# Patient Record
Sex: Female | Born: 1940 | Race: Black or African American | Hispanic: No | Smoking: Never smoker
Health system: Southern US, Community
[De-identification: ages and names within clinical notes are randomized; demographics above are authoritative.]

## PROBLEM LIST (undated history)

## (undated) DIAGNOSIS — I7121 Aneurysm of the ascending aorta, without rupture: Secondary | ICD-10-CM

## (undated) DIAGNOSIS — D132 Benign neoplasm of duodenum: Secondary | ICD-10-CM

## (undated) DIAGNOSIS — I10 Essential (primary) hypertension: Secondary | ICD-10-CM

## (undated) DIAGNOSIS — M1990 Unspecified osteoarthritis, unspecified site: Secondary | ICD-10-CM

## (undated) DIAGNOSIS — D649 Anemia, unspecified: Secondary | ICD-10-CM

## (undated) DIAGNOSIS — K5790 Diverticulosis of intestine, part unspecified, without perforation or abscess without bleeding: Secondary | ICD-10-CM

## (undated) DIAGNOSIS — B373 Candidiasis of vulva and vagina: Secondary | ICD-10-CM

## (undated) DIAGNOSIS — N39490 Overflow incontinence: Secondary | ICD-10-CM

## (undated) DIAGNOSIS — M5430 Sciatica, unspecified side: Secondary | ICD-10-CM

## (undated) DIAGNOSIS — B3731 Acute candidiasis of vulva and vagina: Secondary | ICD-10-CM

## (undated) DIAGNOSIS — N393 Stress incontinence (female) (male): Secondary | ICD-10-CM

## (undated) DIAGNOSIS — D369 Benign neoplasm, unspecified site: Secondary | ICD-10-CM

## (undated) DIAGNOSIS — N3090 Cystitis, unspecified without hematuria: Secondary | ICD-10-CM

## (undated) DIAGNOSIS — K2970 Gastritis, unspecified, without bleeding: Secondary | ICD-10-CM

## (undated) DIAGNOSIS — E785 Hyperlipidemia, unspecified: Secondary | ICD-10-CM

## (undated) DIAGNOSIS — C539 Malignant neoplasm of cervix uteri, unspecified: Secondary | ICD-10-CM

## (undated) HISTORY — PX: ABDOMINAL HYSTERECTOMY: SHX81

## (undated) HISTORY — PX: ERCP: SHX60

---

## 2008-02-24 ENCOUNTER — Ambulatory Visit: Payer: Self-pay | Admitting: Family Medicine

## 2008-08-03 ENCOUNTER — Ambulatory Visit: Payer: Self-pay | Admitting: Family Medicine

## 2008-08-03 IMAGING — US ABDOMEN ULTRASOUND
1 series · 17 of 25 positions shown · non-contrast
Comparison: none

REASON FOR EXAM: RUQ abd pain
COMMENTS:

[Series 1: abdomen ultrasound · 17 of 59 slices shown]
[im 1/59]
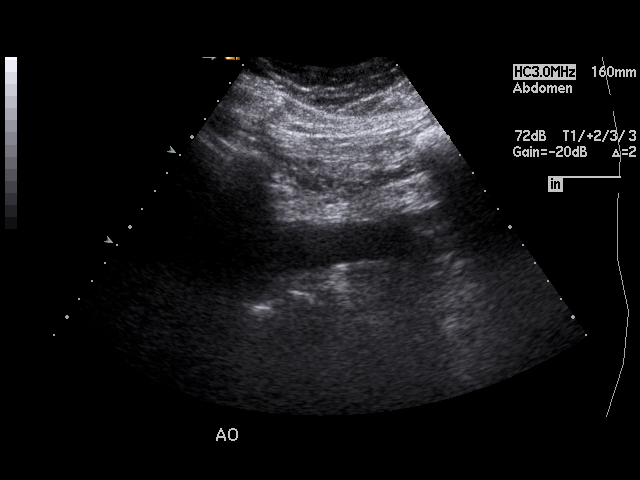
[im 5/59]
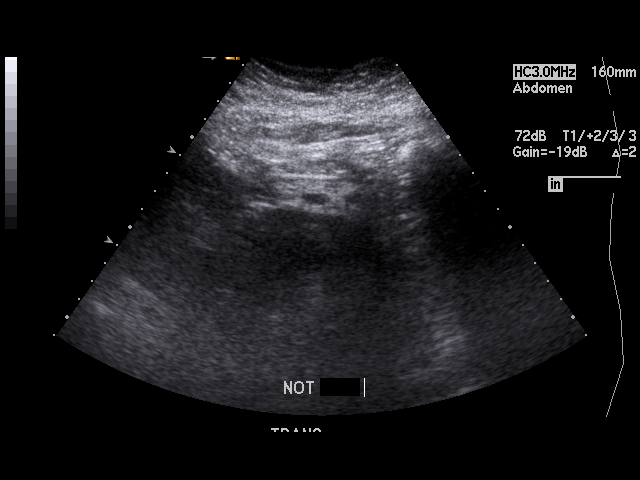
[im 8/59]
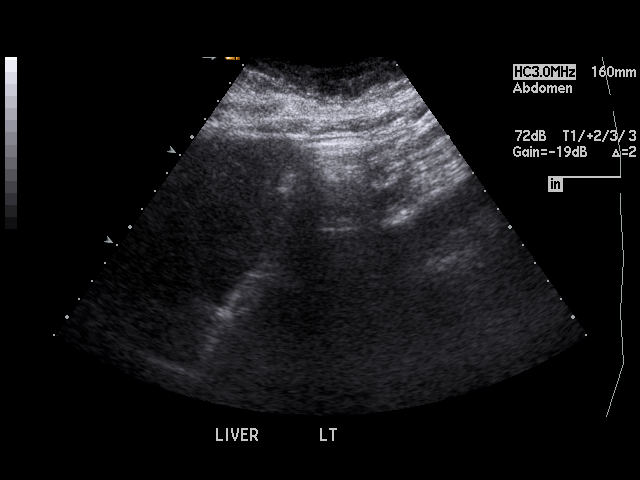
[im 13/59]
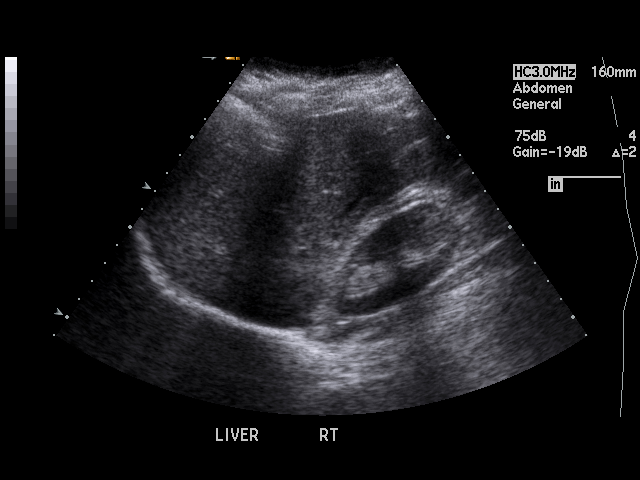
[im 15/59]
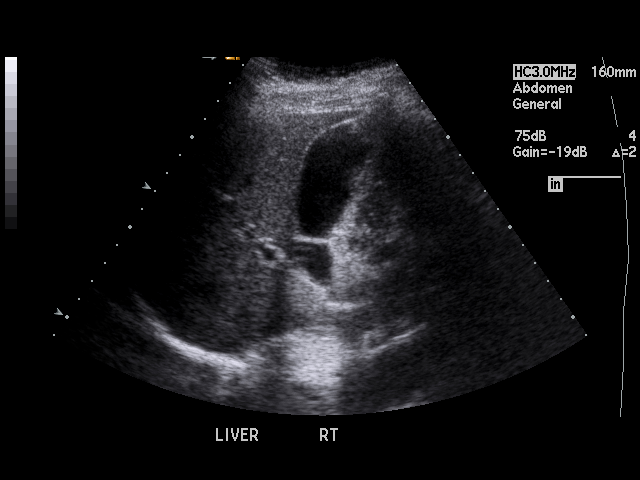
[im 20/59]
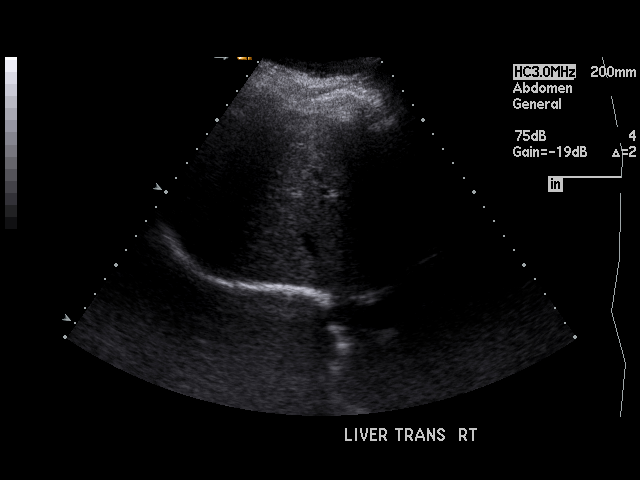
[im 22/59]
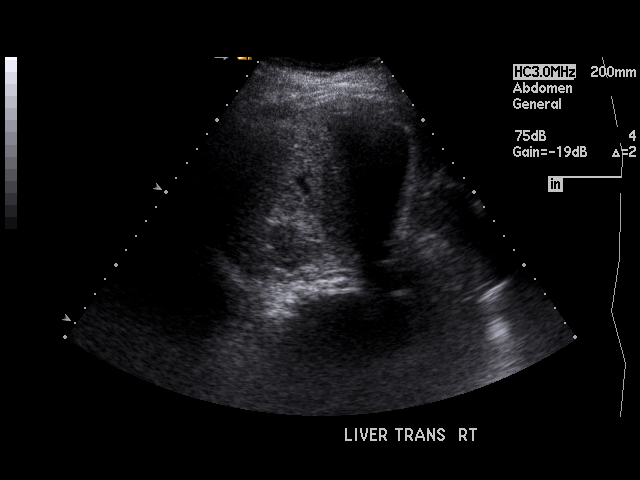
[im 27/59]
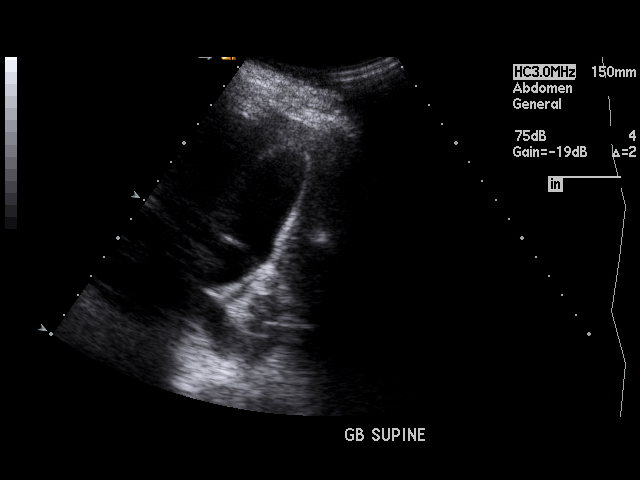
[im 30/59]
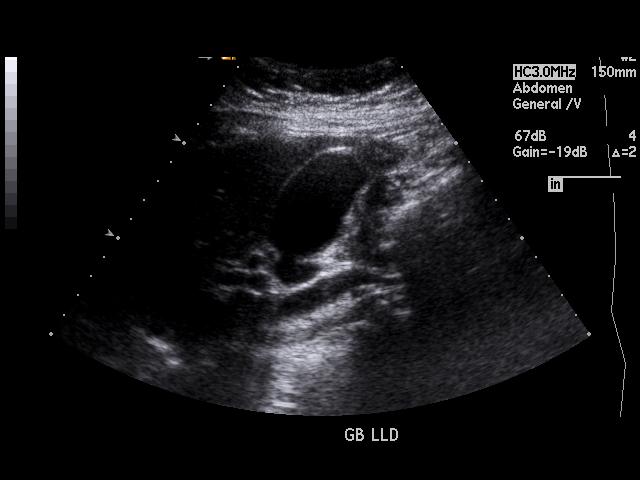
[im 32/59]
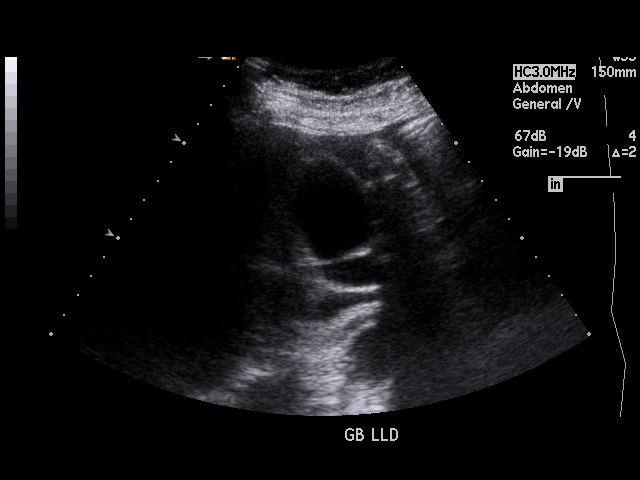
[im 37/59]
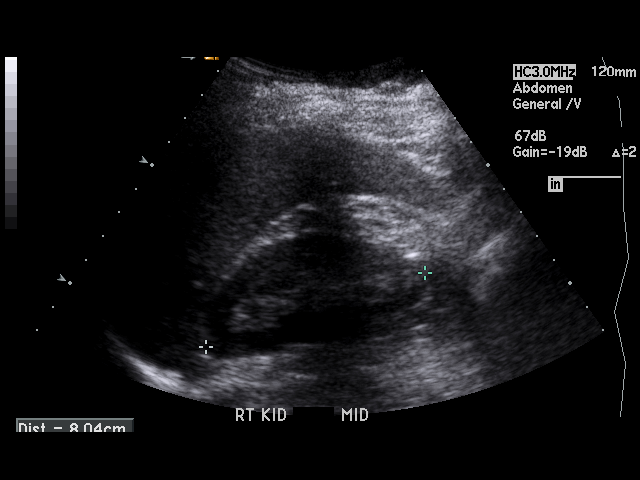
[im 39/59]
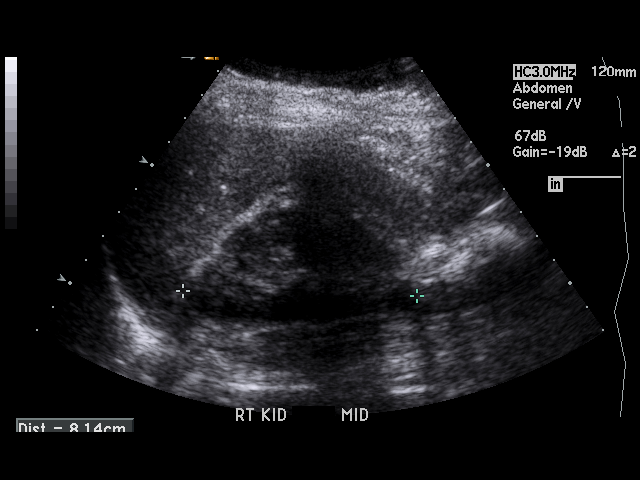
[im 44/59]
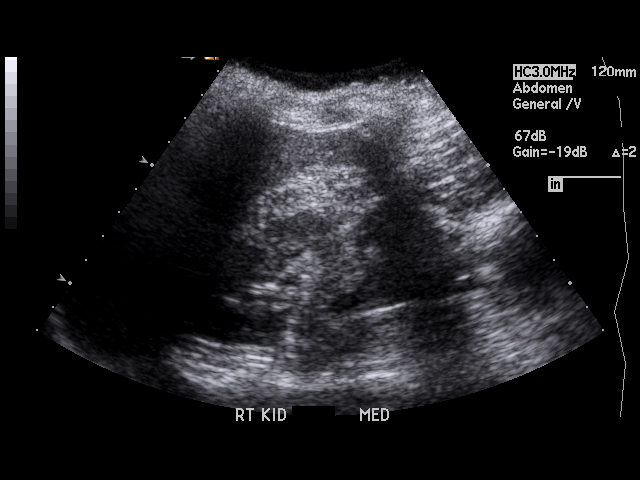
[im 46/59]
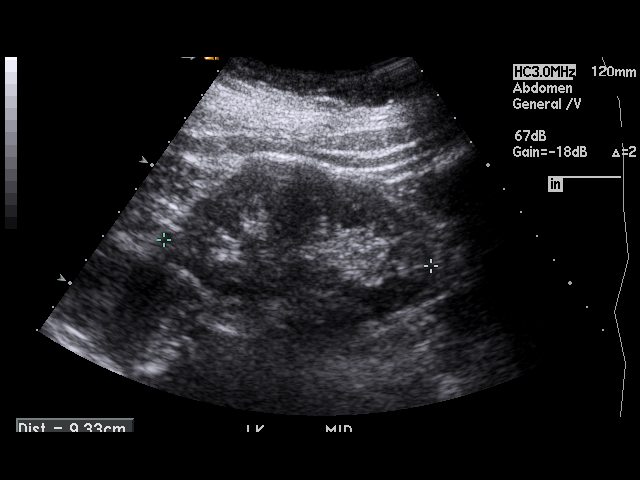
[im 51/59]
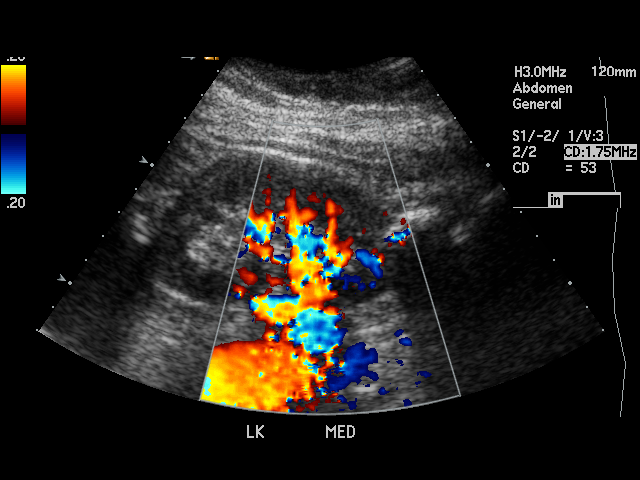
[im 54/59]
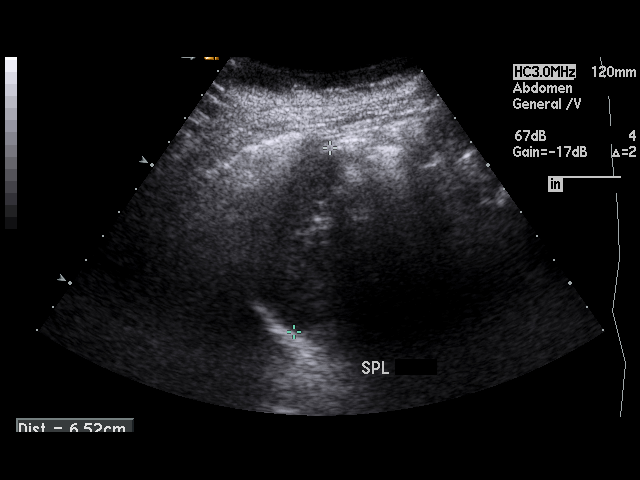
[im 59/59]
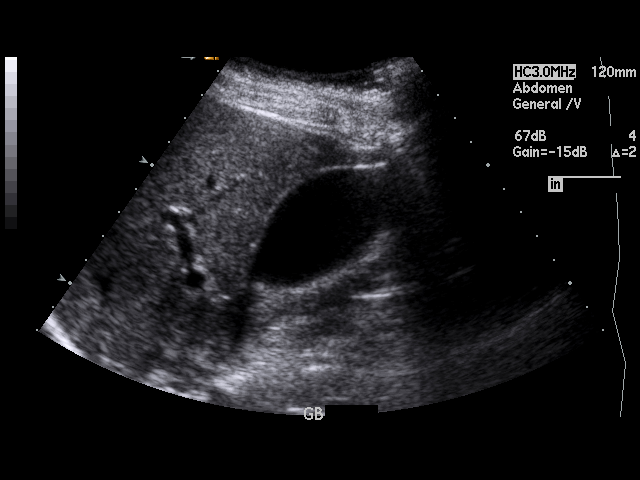

[17 of 25 positions shown; findings below may reference images not displayed]

PROCEDURE:     US  - US ABDOMEN GENERAL SURVEY  - [DATE]  [DATE]

RESULT:     Ultrasound of the abdominal aorta shows no gross abnormality.
Portions are obscured. The pancreas cannot be seen because of overlying
bowel gas. Visualization of the liver is also marginal at best. There is
increased echotexture of the liver consistent with fatty infiltration. There
is no ductal dilation. The common bile duct diameter is 6.5 mm. The portal
venous flow is normal. The right kidney length is only 8 cm. There is no
obstruction or mass. There are no gallstones evident. Gallbladder wall
thickness is 2.2 mm. The spleen and left kidney appear to be unremarkable.
IMPRESSION: 1.     Limited visualization of the liver with poor visualization or
non-visualization of the pancreas.
2.     The right kidney is small measuring just over 8 cm in length. The
left kidney length is 9.5 cm.

## 2008-09-08 ENCOUNTER — Ambulatory Visit: Payer: Self-pay | Admitting: Gastroenterology

## 2008-09-30 ENCOUNTER — Ambulatory Visit: Payer: Self-pay | Admitting: Gastroenterology

## 2008-09-30 IMAGING — CT CT ABD-PELV W/ CM
1 of 2 series · 14 of 32 positions shown, 18 images · non-contrast
Comparison: none

REASON FOR EXAM: COMMENTS:

[Series 2: abdomen · axial · 0.75mm/px · z∈[-556,-116]mm · 14 of 98 slices shown, 18 images]
[im 5/98  soft-tissue]
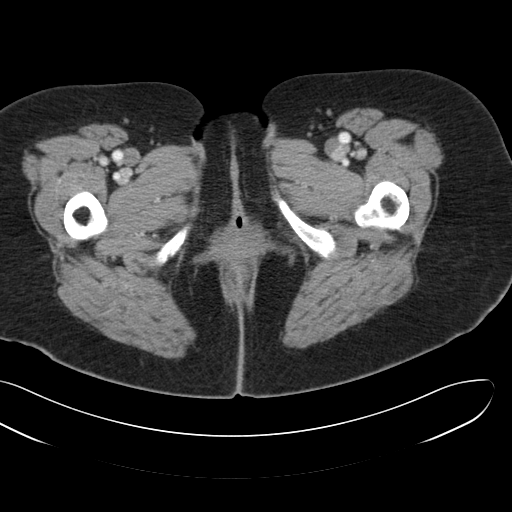
[im 5/98  bone]
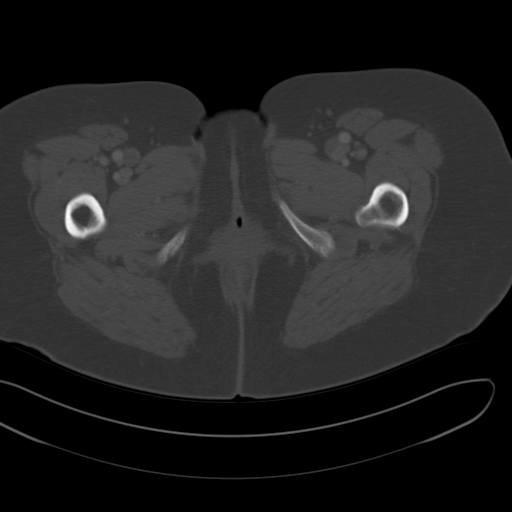
[im 13/98  soft-tissue]
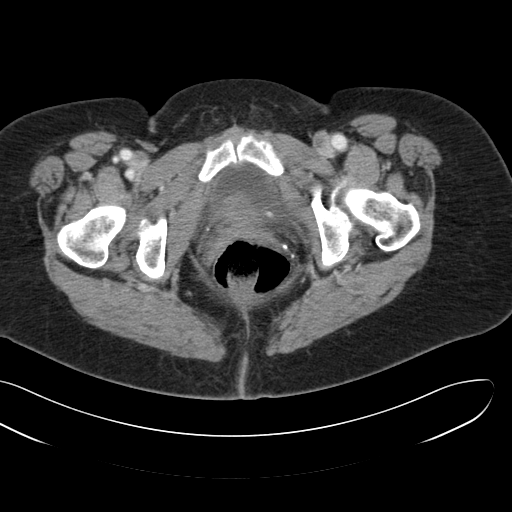
[im 21/98  soft-tissue]
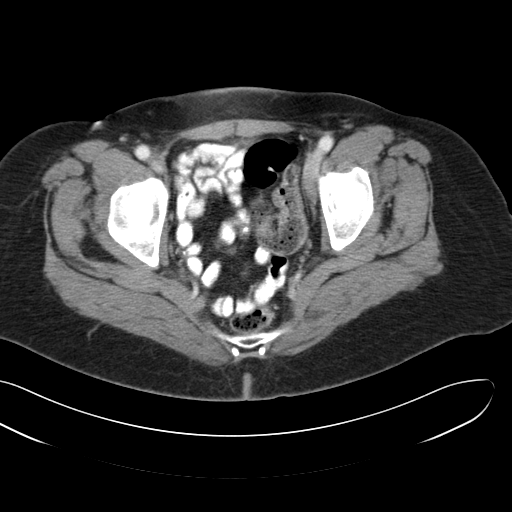
[im 29/98  soft-tissue]
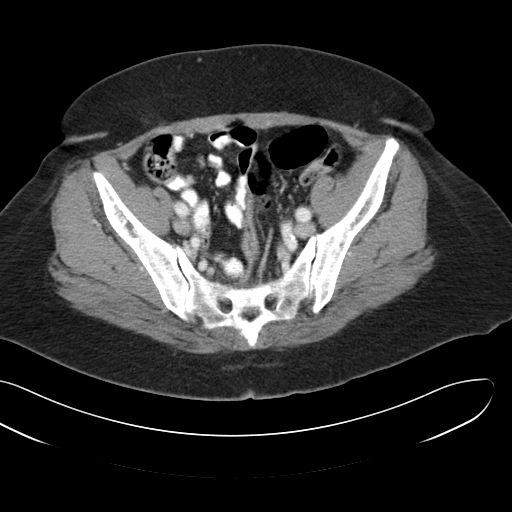
[im 37/98  soft-tissue]
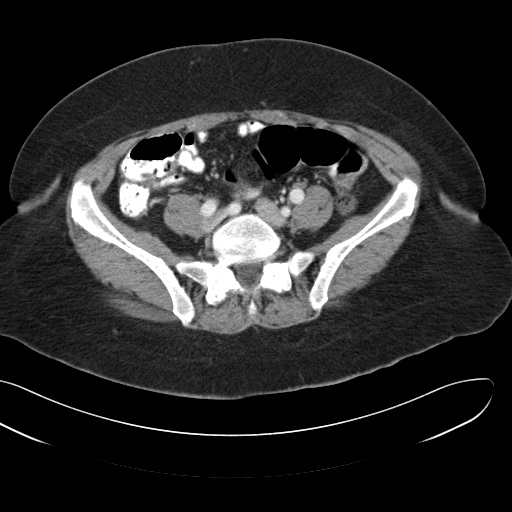
[im 45/98  soft-tissue]
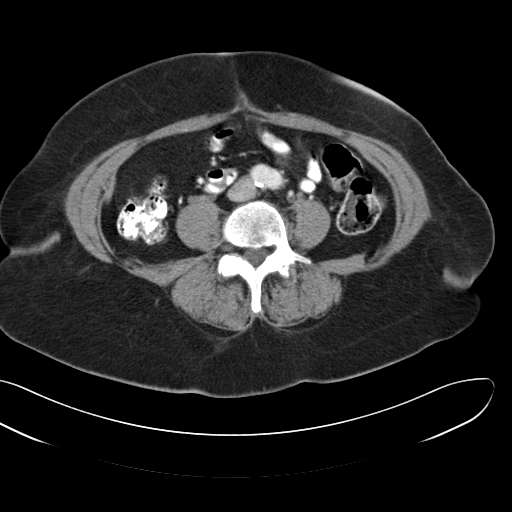
[im 53/98  soft-tissue]
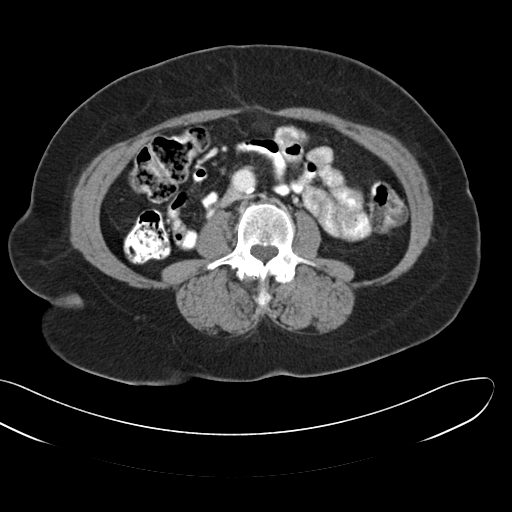
[im 61/98  soft-tissue]
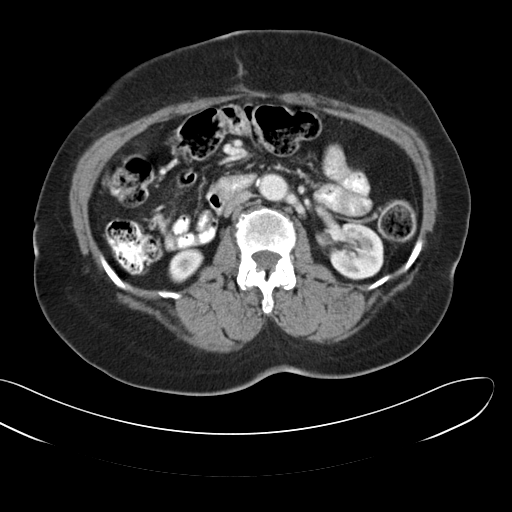
[im 69/98  soft-tissue]
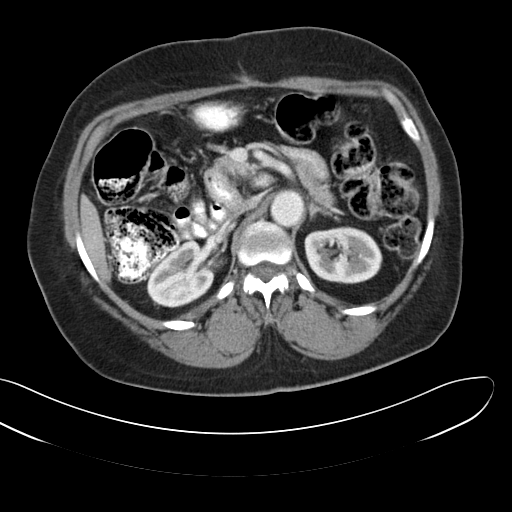
[im 69/98  bone]
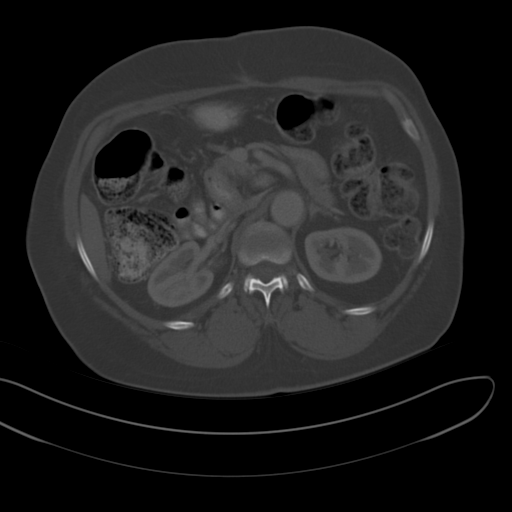
[im 77/98  soft-tissue]
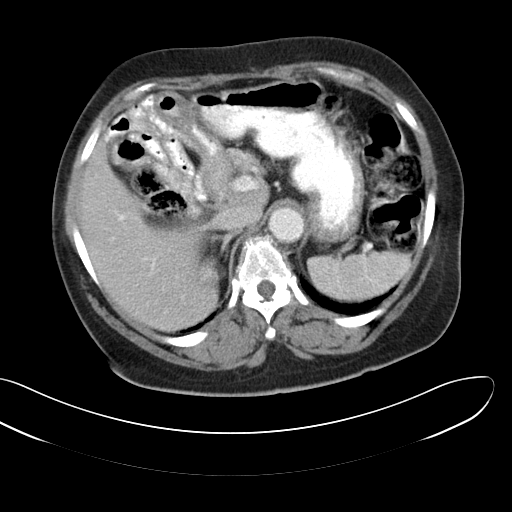
[im 81/98  lung]
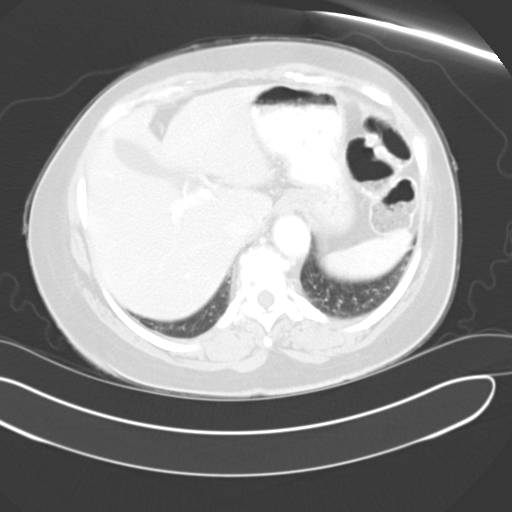
[im 85/98  soft-tissue]
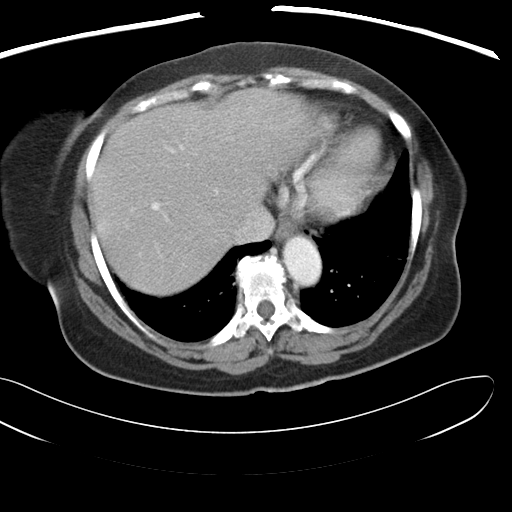
[im 85/98  lung]
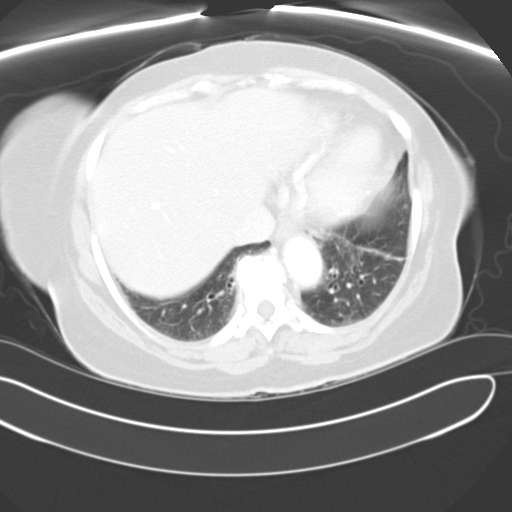
[im 89/98  lung]
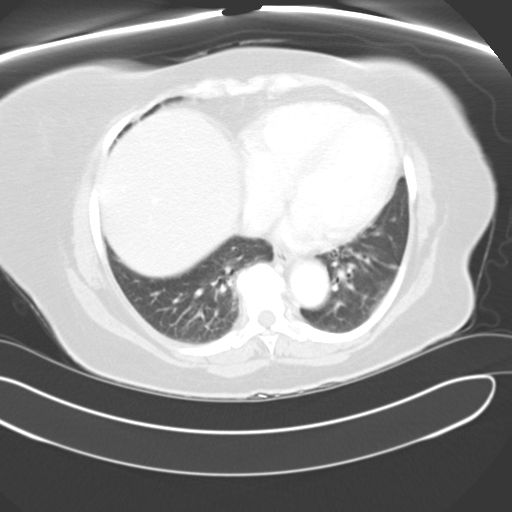
[im 93/98  soft-tissue]
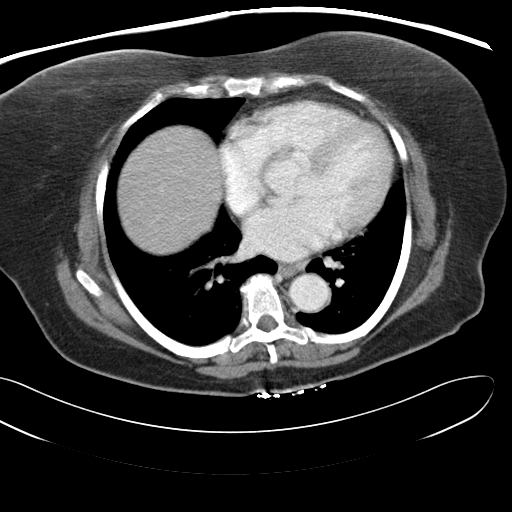
[im 93/98  lung]
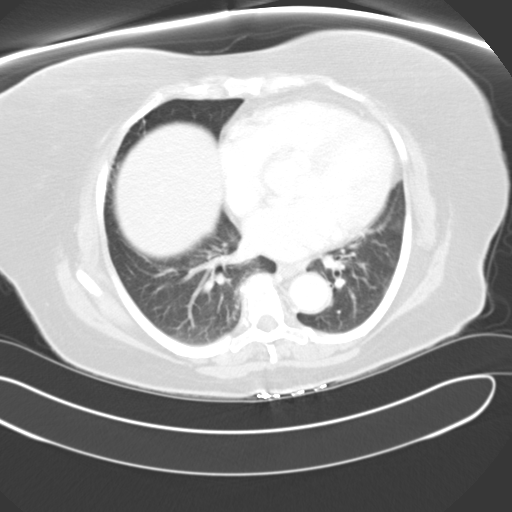

[14 of 32 positions shown; findings below may reference images not displayed]

PROCEDURE:     CT  - CT ABDOMEN / PELVIS  W  - [DATE]  [DATE]

RESULT:      Axial contrast enhanced CT scanning was performed through the
abdomen and pelvis at 5 mm intervals and slice thicknesses. The patient
received 100 cc of [2A] as well as oral contrast material. Review of
3-dimensional reconstructed images was performed separately on the [employer]
Server monitor.

The partially contrast-filled loops of small bowel exhibit no acute
abnormality. The stool and contrast in gas pattern within the colon is
normal. The site of the previous cancerous polyp is not known to me. No
annular constricting lesion is demonstrated. I do not see evidence of
mesenteric or retroperitoneal lymphadenopathy.

The liver, gallbladder, spleen, stomach, and pancreas exhibit no acute
abnormality. The adrenal glands are normal in density and contour. The
kidneys enhance well and exhibit no evidence of obstruction. The abdominal
aorta is tortuous in its course but there is no evidence of an aneurysm. The
lung bases exhibit minimal increased density in the posterior aspects of
both lower lobes consistent with subsegmental atelectasis and/or fibrotic
change. Minimal bronchiectasis is suspected here as well. The lumbar
vertebral bodies are preserved in height. The partially distended urinary
bladder is normal in appearance. The uterus is surgically absent. I see no
adnexal masses. There is no evidence of an inguinal hernia.
IMPRESSION: 1. I do not see acute abnormality of the partially contrast-filled colon.
The small bowel exhibits no acute abnormality either.
2. There are no findings suspicious for metastatic disease to the liver or
other abdominal viscera.
3. There is no retroperitoneal nor intraperitoneal lymphadenopathy.

Addendum: The history of prior duodenal ulcer has been provided to me. On
today's study the stomach is partially distended and is grossly normal.
There is mild prominence of the wall of the proximal duodenum which could be
due to incomplete distention. However, given the history of prior abnormal
endoscopy and known duodenal ulcer, inflammatory mucosal edema cannot be
excluded. No discrete ulcer niche is demonstrated.

## 2009-02-27 ENCOUNTER — Ambulatory Visit: Payer: Self-pay | Admitting: Family Medicine

## 2010-02-20 ENCOUNTER — Ambulatory Visit: Payer: Self-pay | Admitting: Gastroenterology

## 2010-02-20 HISTORY — PX: COLONOSCOPY: SHX174

## 2010-02-28 ENCOUNTER — Ambulatory Visit: Payer: Self-pay | Admitting: Family Medicine

## 2010-03-05 ENCOUNTER — Ambulatory Visit: Payer: Self-pay | Admitting: Gastroenterology

## 2010-03-05 IMAGING — CR DG BE W/ AIR HIGH DENSITY
2 series · 10 of 10 positions shown · non-contrast
Comparison: none

REASON FOR EXAM: incomplete colonoscopy hx colon polyps
COMMENTS:

[Series 1: run · 5 of 13 slices shown]
[im 1/13]
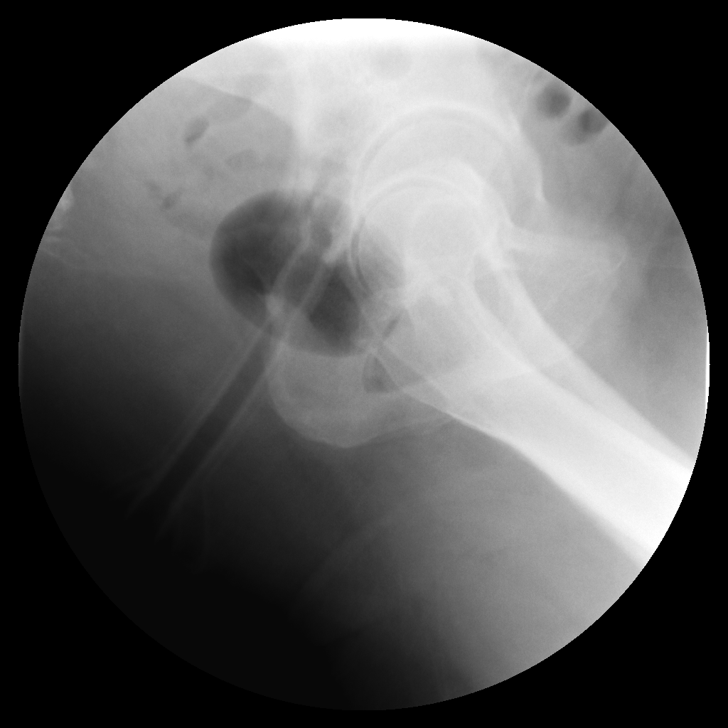
[im 4/13]
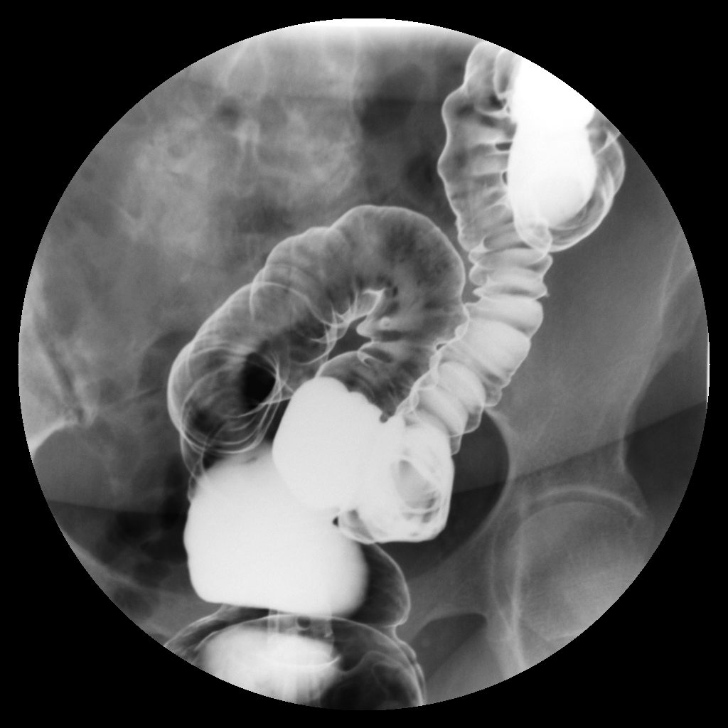
[im 7/13]
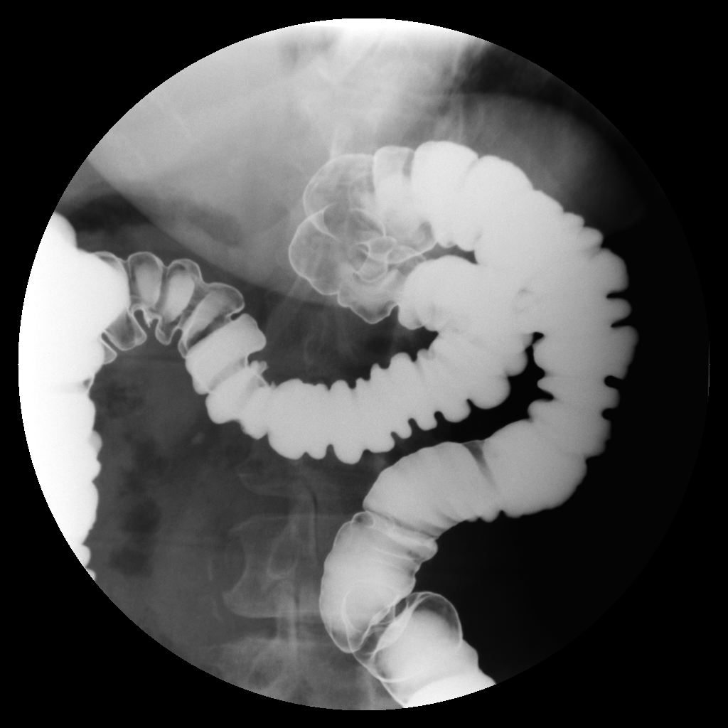
[im 10/13]
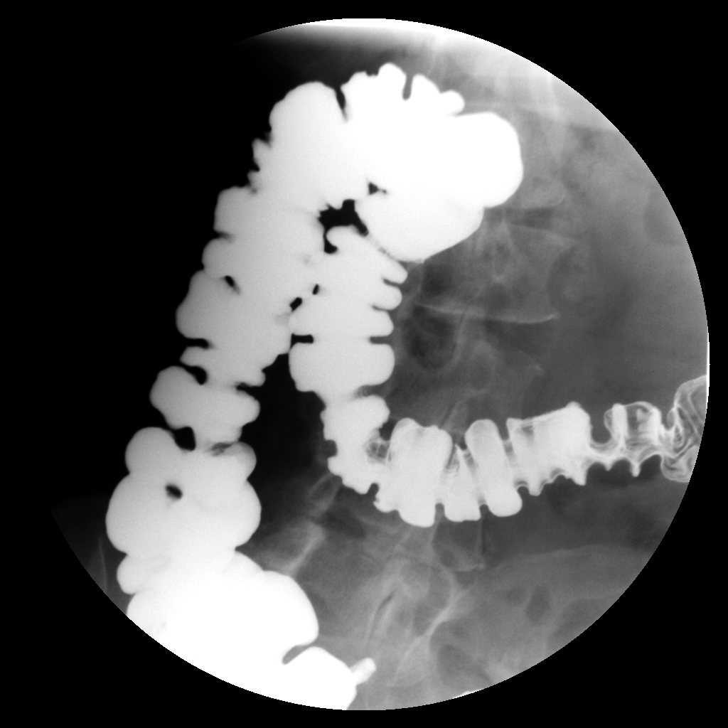
[im 13/13]
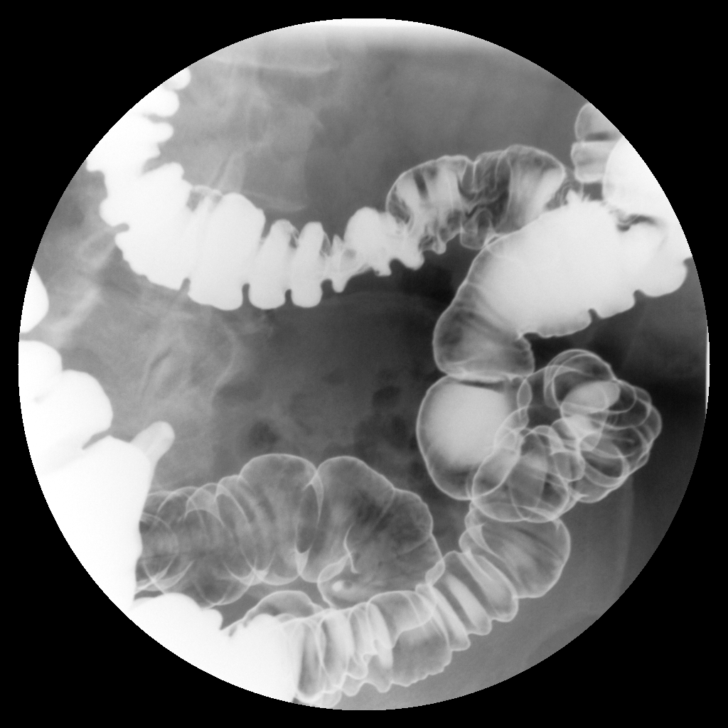

[Series 1: view not recorded · 0.17mm/px · 5 of 15 slices shown]
[im 1/15]
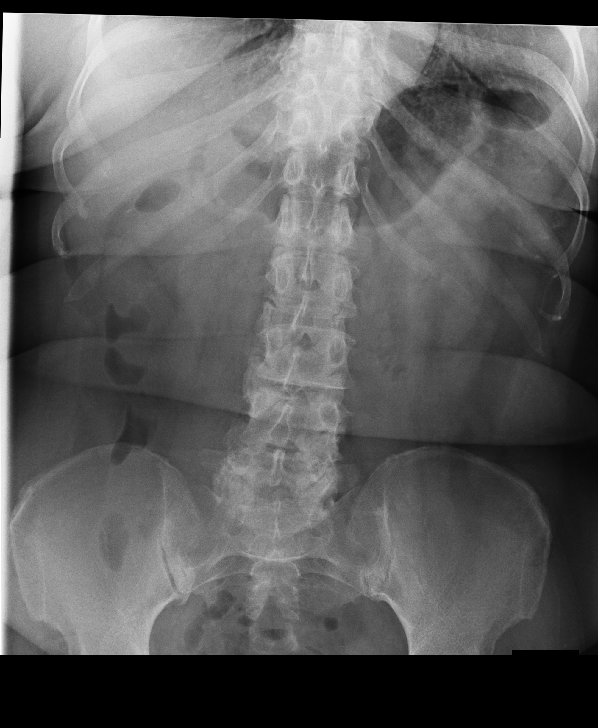
[im 4/15]
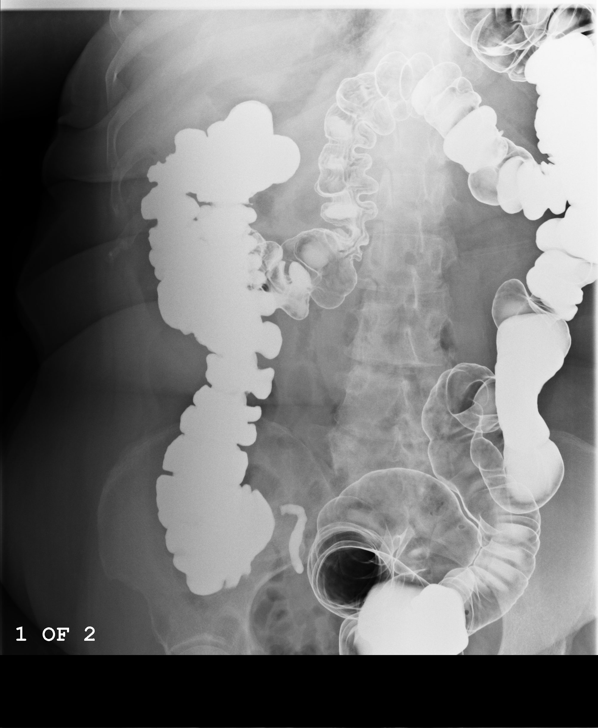
[im 8/15]
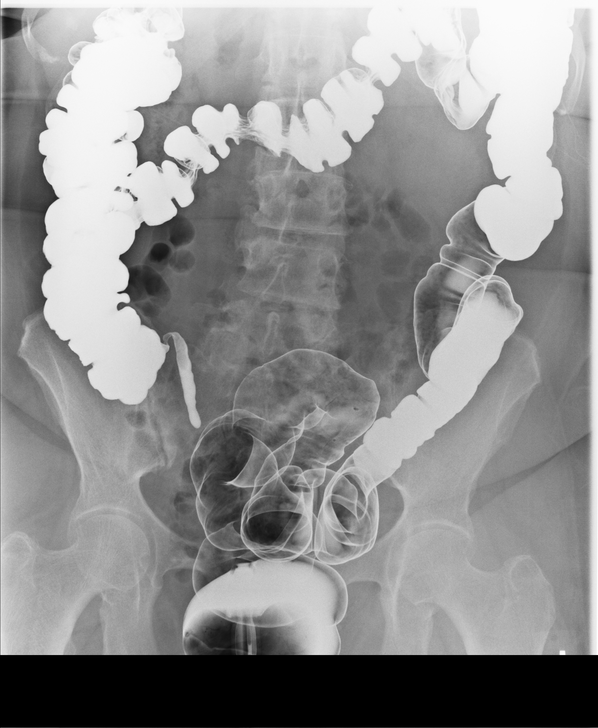
[im 11/15]
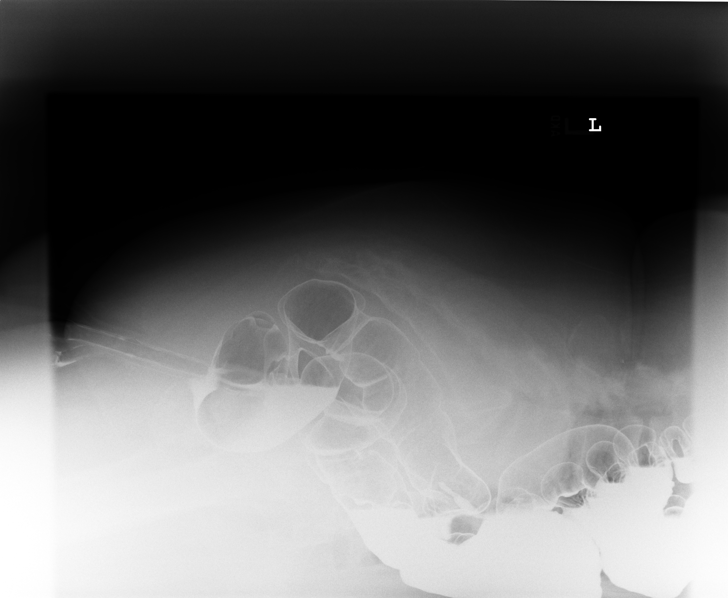
[im 15/15]
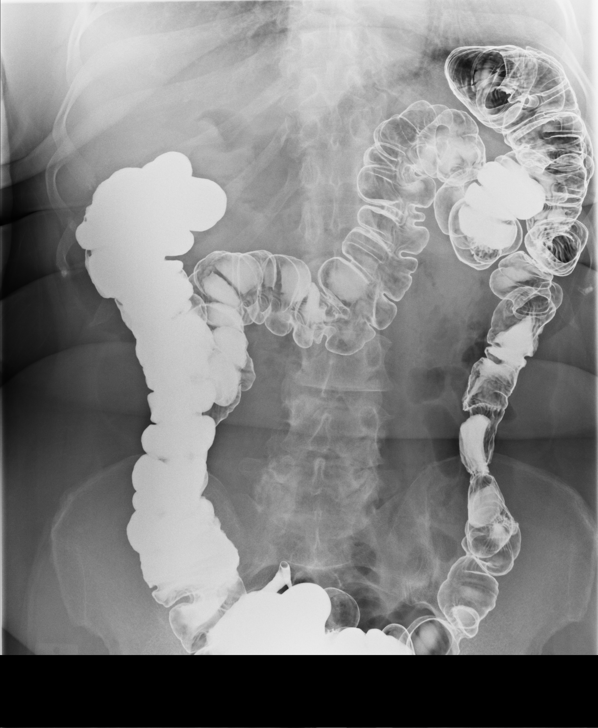

[10 of 10 positions shown; findings below may reference images not displayed]

PROCEDURE:     FL  - FL BARIUM ENEMA WITH AIR COLON  - [DATE] [DATE]

RESULT:     The anticipated procedure was discussed with the patient. She
voiced her willingness to proceed.

The scout film revealed a normal stool and gas pattern. The barium catheter
balloon was inflated under fluoroscopic guidance. Subsequently barium passed
freely into the somewhat redundant colon. It passed to the right colon.
Draining was then accomplished and air was insufflated. Adequate coating and
distention of the colon was obtained. The appendix did fill. No annular
constricting lesions were demonstrated. There is no significant
diverticulosis. No polypoid lesions were identified but due to the
redundancy of the colon it was difficult to see all of the mucosa in relief.
There are no findings suspicious for colitis.
IMPRESSION: 1. The colon is quite redundant.
2. I do not see objective evidence of annular constricting lesions,
extrinsic impression upon the bowel, nor evidence of colitis or
diverticulitis.
3. There are no findings suspicious for obstruction of the colon.

## 2010-04-30 ENCOUNTER — Ambulatory Visit: Payer: Self-pay | Admitting: Gastroenterology

## 2010-05-01 LAB — PATHOLOGY REPORT

## 2010-06-04 ENCOUNTER — Telehealth: Payer: Self-pay

## 2010-06-04 DIAGNOSIS — D132 Benign neoplasm of duodenum: Secondary | ICD-10-CM

## 2010-06-04 NOTE — Telephone Encounter (Signed)
Pt scheduled for EGD with Nick[facility] [place] for 06/07/10 [place] pt aware to arrive before the appt for preadmit testing and call the day before before 1-3 pm for arrival time. Meds reviewed and pt instructed,

## 2010-06-07 ENCOUNTER — Ambulatory Visit: Payer: Self-pay

## 2010-06-07 ENCOUNTER — Encounter: Payer: Self-pay | Admitting: Gastroenterology

## 2010-06-14 ENCOUNTER — Encounter: Payer: Self-pay | Admitting: Gastroenterology

## 2011-01-08 ENCOUNTER — Ambulatory Visit: Payer: Self-pay | Admitting: Gastroenterology

## 2011-01-09 LAB — PATHOLOGY REPORT

## 2011-03-04 ENCOUNTER — Ambulatory Visit: Payer: Self-pay | Admitting: Family Medicine

## 2011-08-09 ENCOUNTER — Ambulatory Visit: Payer: Self-pay | Admitting: Gastroenterology

## 2011-08-12 LAB — PATHOLOGY REPORT

## 2011-09-24 ENCOUNTER — Ambulatory Visit: Payer: Self-pay | Admitting: Vascular Surgery

## 2011-09-24 IMAGING — CT CT ANGIO NECK-CHEST
1 series · 12 of 14 positions shown · IV contrast (APPLIED)
Comparison: none

REASON FOR EXAM: Labs 1st  difficulty swallowing left subclavian artery
aneurysm  innominate ...
COMMENTS:

PROCEDURE:     KCT - KCT ANGIOGRAPHY CHEST/NECK W/WO  - [DATE] [DATE]
RESULT:     History: Aneurysm.
Comparison Study: No recent.

[Series 5: 3 soft tissue · axial · 0.36mm/px · z∈[-225,+12]mm · 12 of 95 slices shown]
[im 8/95  soft-tissue]
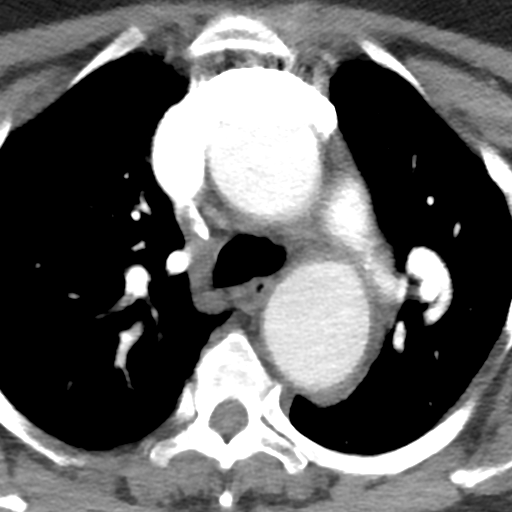
[im 15/95  bone]
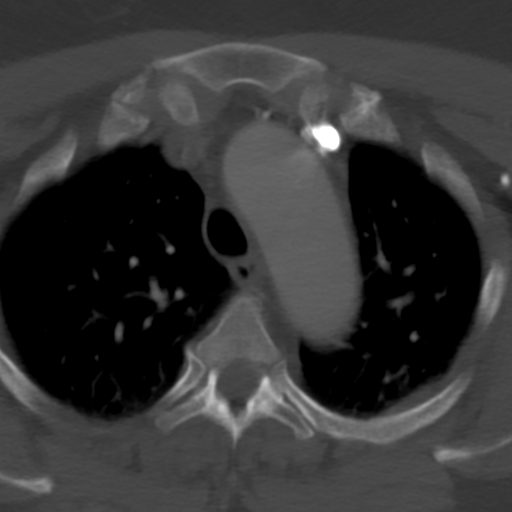
[im 22/95  soft-tissue]
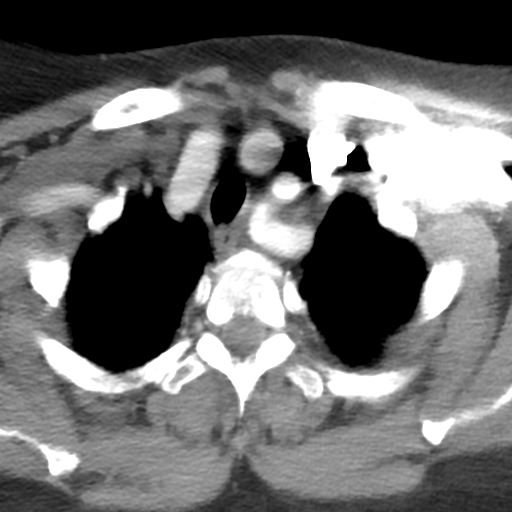
[im 29/95  bone]
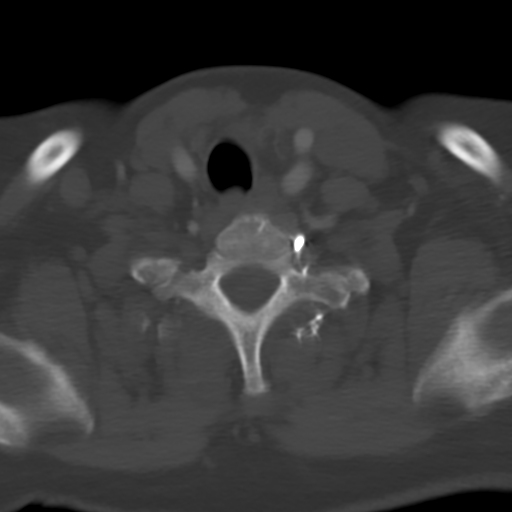
[im 37/95  soft-tissue]
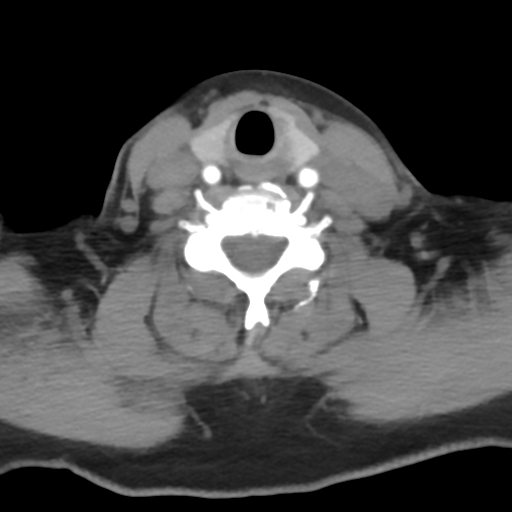
[im 44/95  bone]
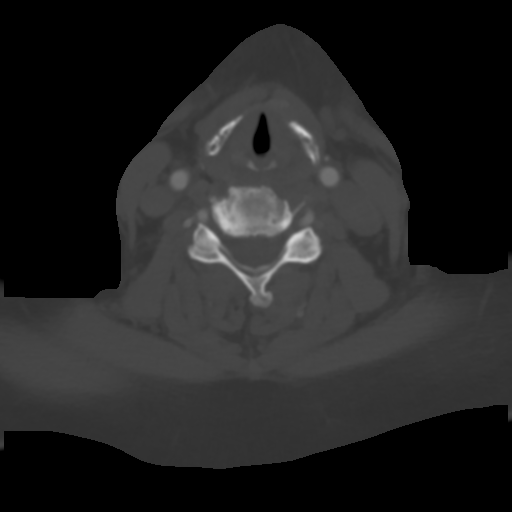
[im 51/95  soft-tissue]
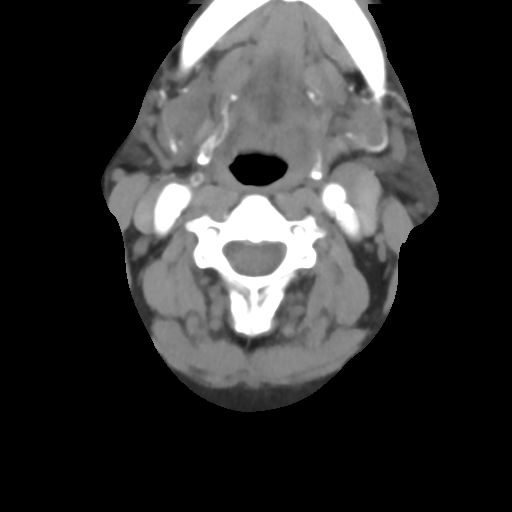
[im 58/95  bone]
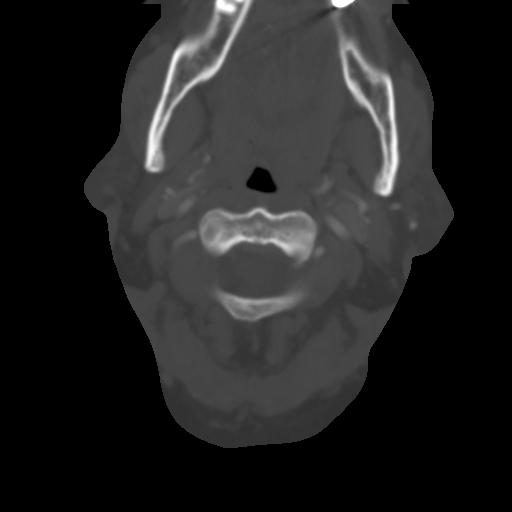
[im 66/95  soft-tissue]
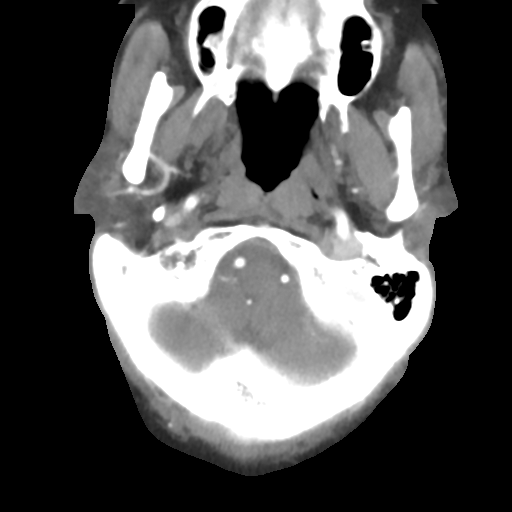
[im 73/95  bone]
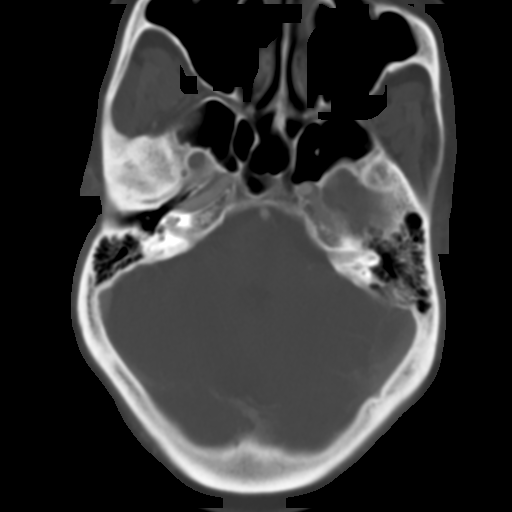
[im 80/95  soft-tissue]
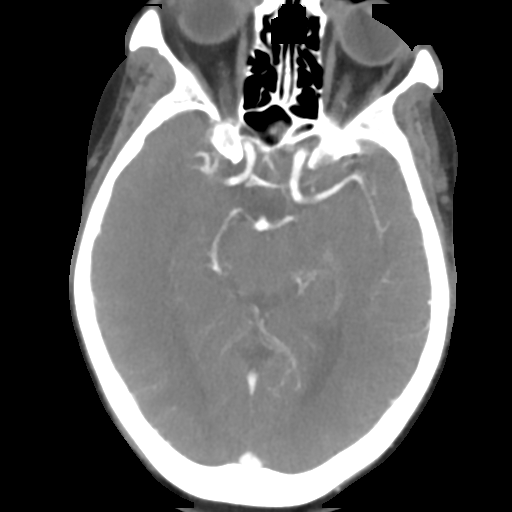
[im 87/95  bone]
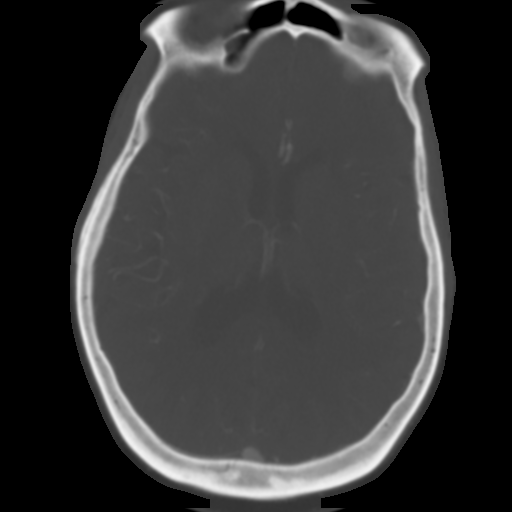

[12 of 14 positions shown; findings below may reference images not displayed]

FINDINGS: Standard CTA obtained under cc of [TL] there is a 1.7 cm in
on artery aneurysm present in the distal innominate artery. Innominate is
tortuous. Subclavian origin involvement is present with aneurysmal dose
patient extending just distal to the vertebral appear. Common carotid is
patent with no aneurysmal dilatation. Left common carotid subclavian widely
patent. Mild ectasia noted in the thoracic arch. Vertebrals are patent.
Calcific mild stenoses noted in both carotid bifurcations. Anterior and
posterior circulation is widely patent.
IMPRESSION: Innominate and right subclavian artery aneurysm as
described above.

## 2012-03-04 ENCOUNTER — Ambulatory Visit: Payer: Self-pay | Admitting: Family Medicine

## 2012-03-04 IMAGING — MG MM CAD SCREENING MAMMO
1 series · 4 of 4 positions shown · non-contrast
Comparison: none

REASON FOR EXAM: SCR MAMMO NO ORDER
COMMENTS:

PROCEDURE:     MAM - MAM DGTL SCRN MAM NO ORDER W/CAD  - [DATE] [DATE]
RESULT:     Bilateral digital screening mammogram dated [DATE]
Comparison study/studies dated: [DATE], [DATE], [DATE], [DATE]

[R CC · right · 4 of 4 slices shown]
[im 1/4]
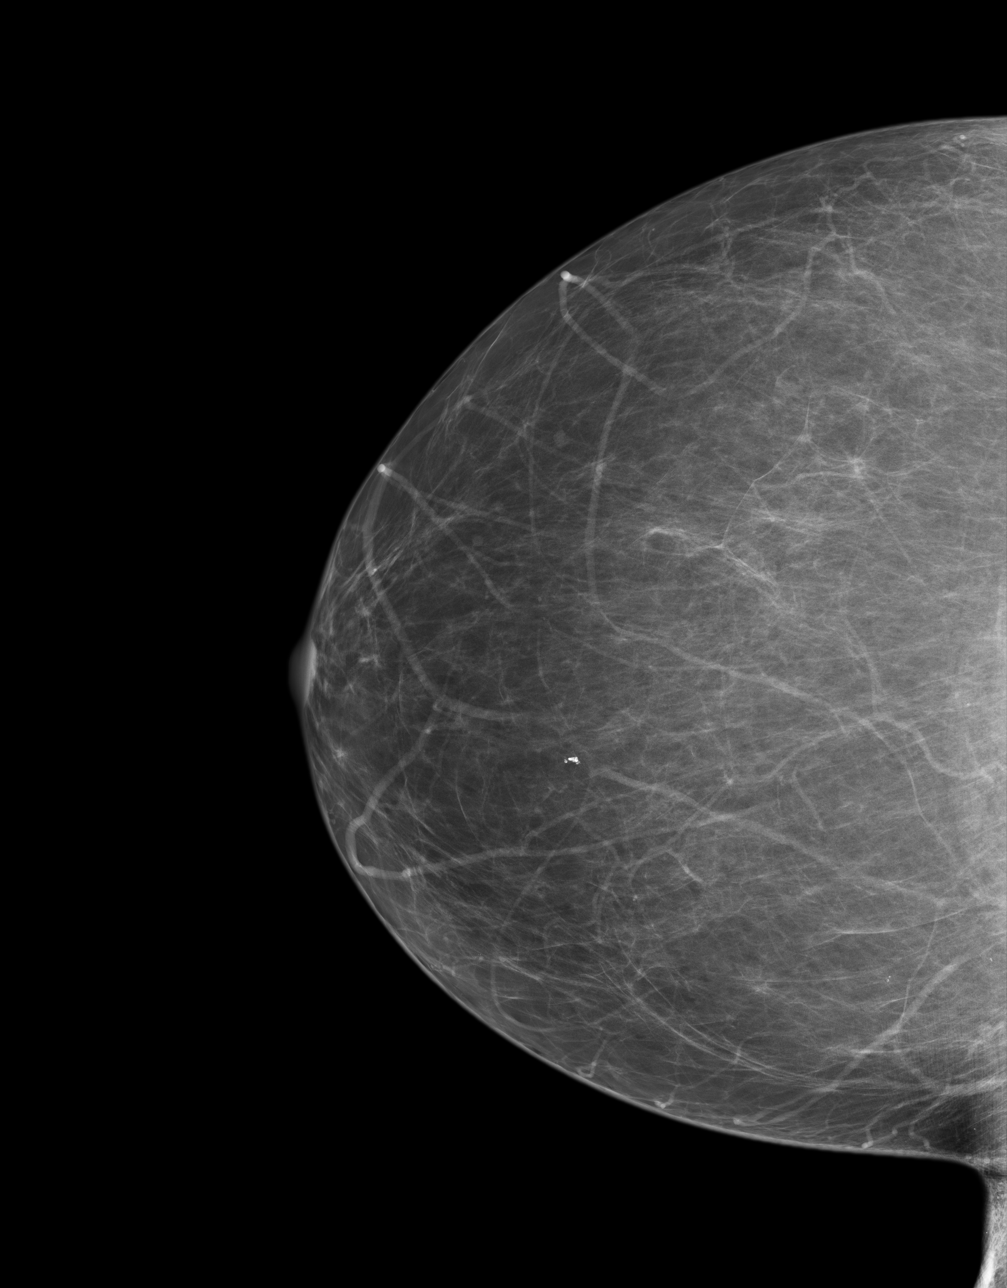
[im 2/4]
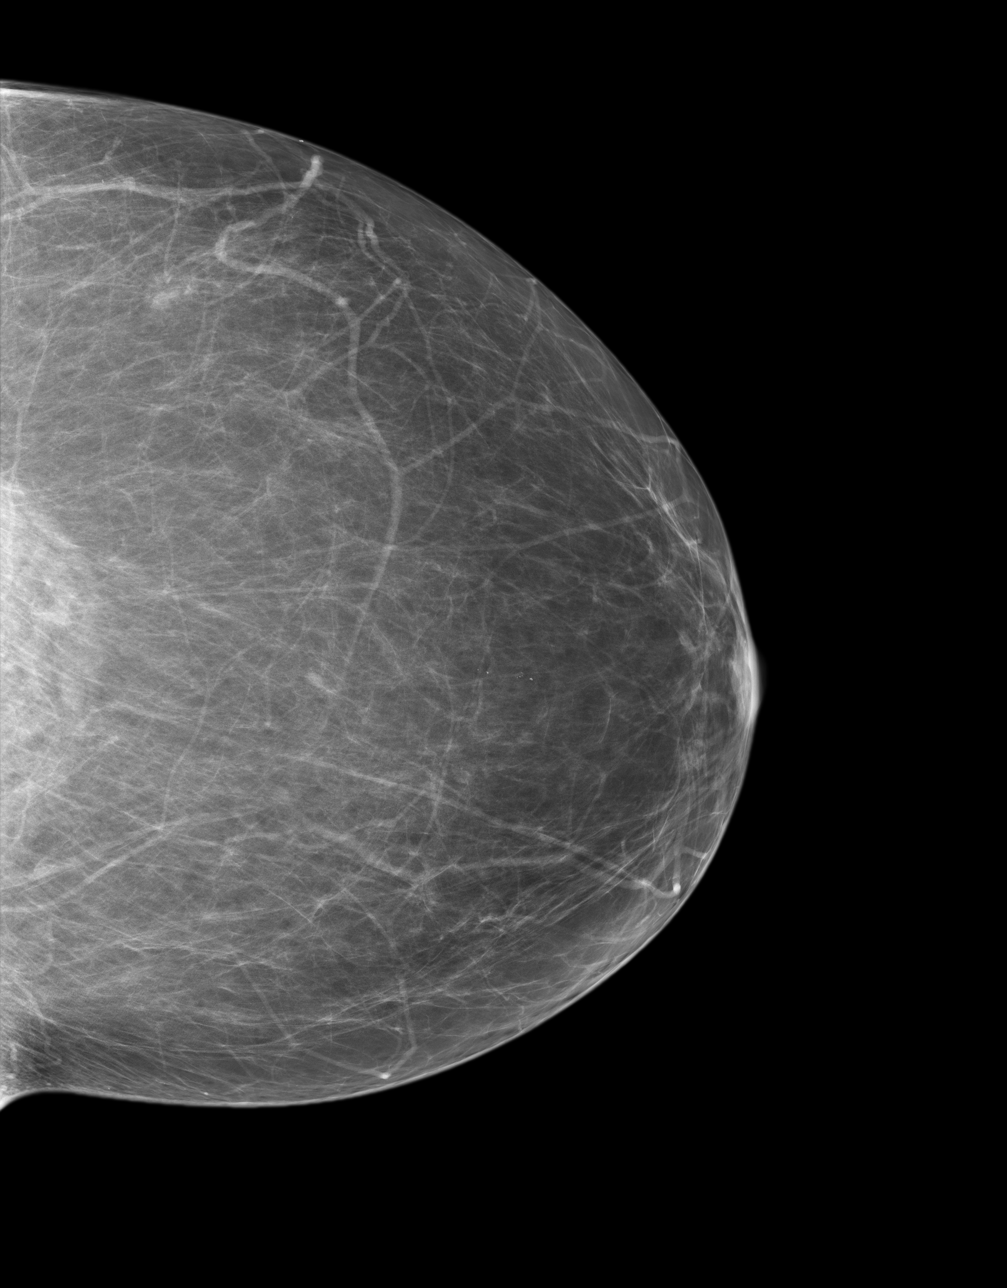
[im 3/4]
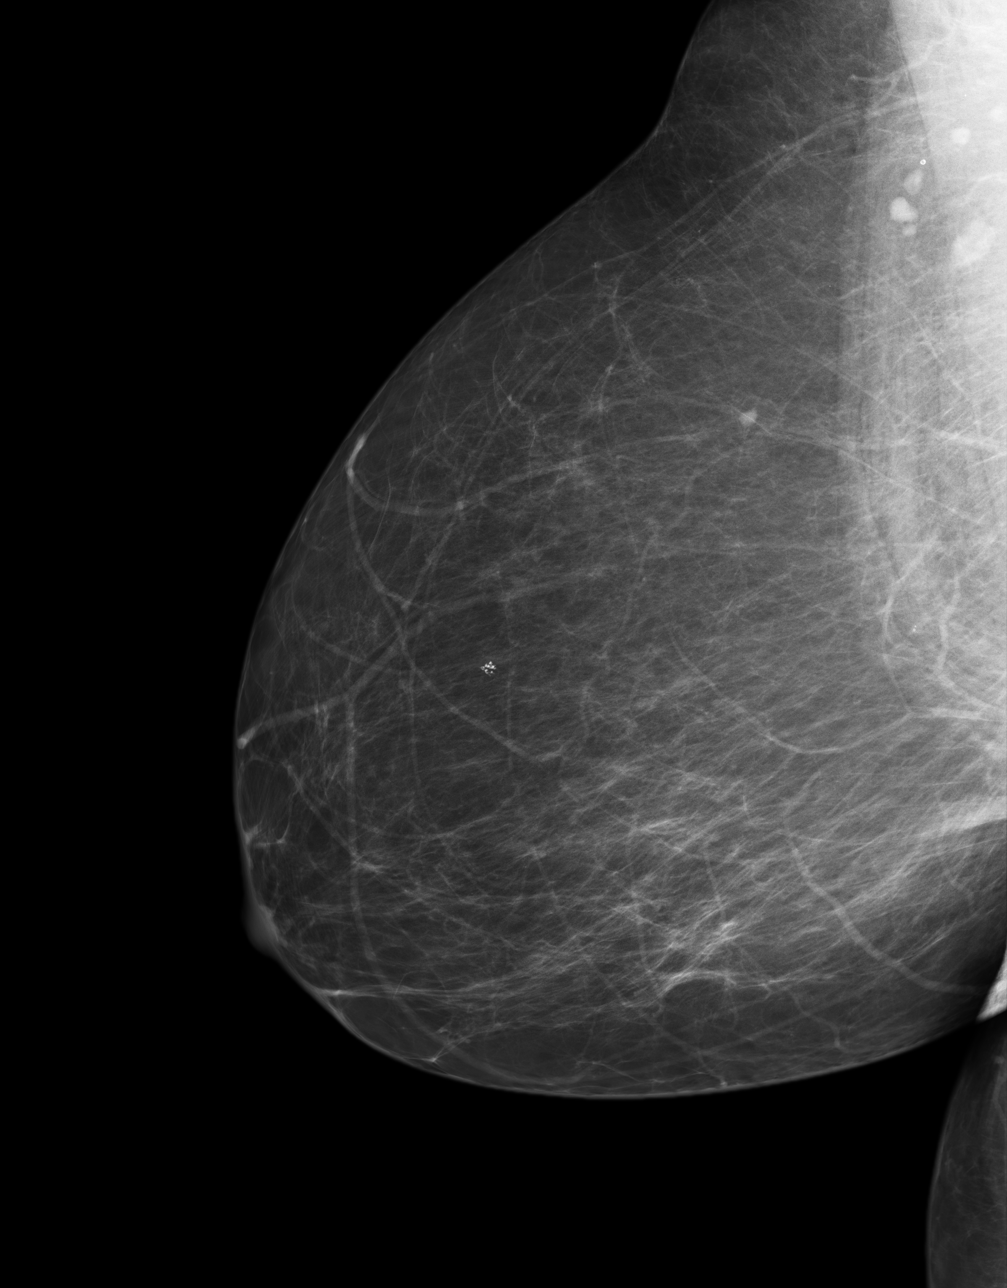
[im 4/4]
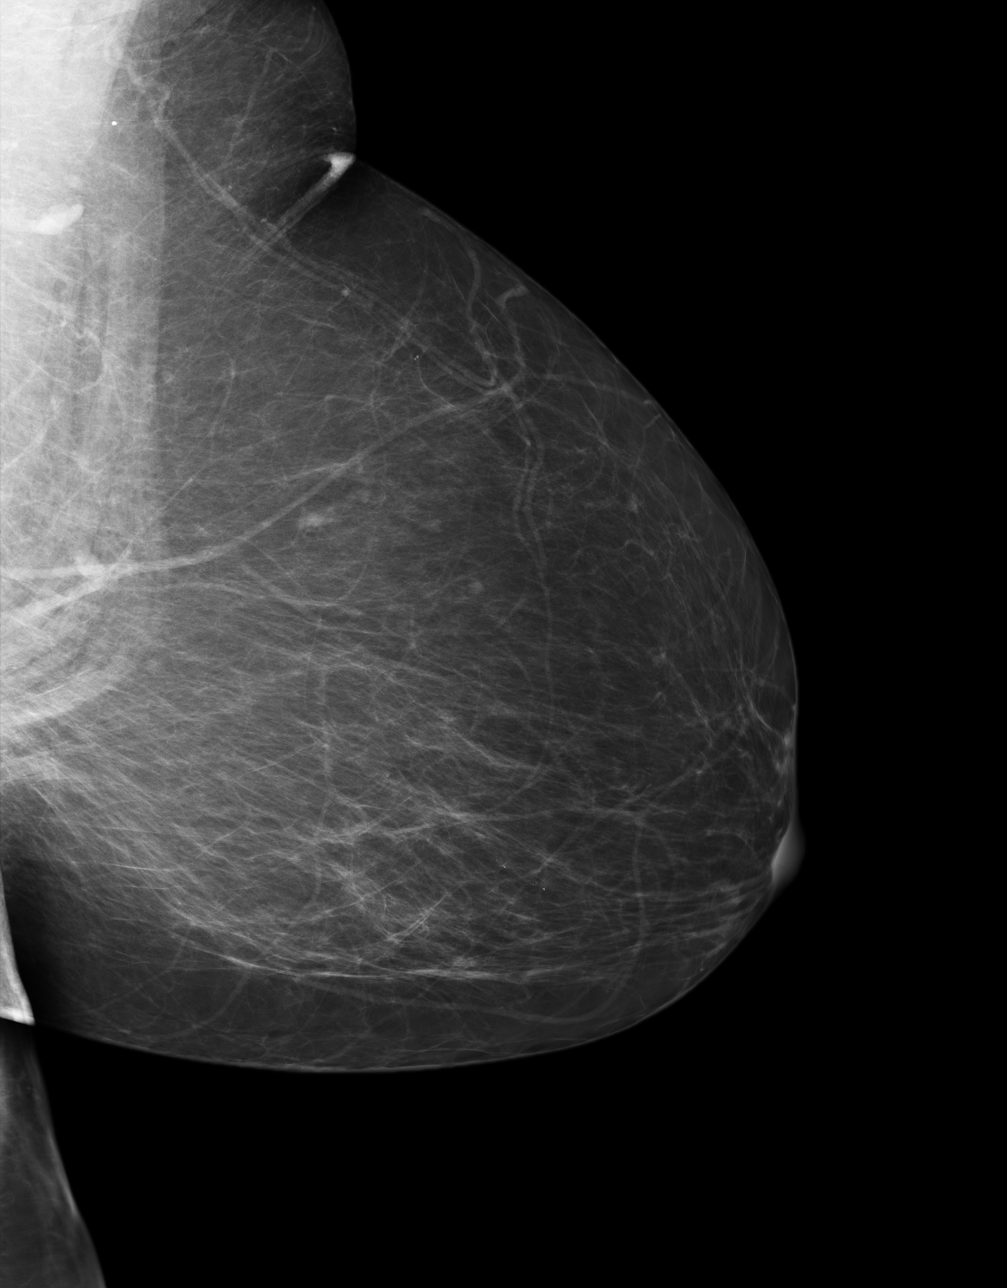

[4 of 4 positions shown; findings below may reference images not displayed]

FINDINGS: BREAST COMPOSITION: The breast composition is ALMOST ENTIRELY
FATTY (glandular tissue is less than 25%)

There is no radiographic evidence of malignant type calcifications ,
architectural distortion, spiculated masses or nodules.
IMPRESSION: BI-RADS: Category 2- Benign Finding

A NEGATIVE MAMMOGRAM REPORT DOES NOT PRECLUDE BIOPSY OR OTHER EVALUATION OF
A CLINICALLY PALPABLE OR OTHERWISE SUSPICIOUS MASS OR LESION. BREAST CANCER
MAY NOT BE DETECTED IN UP TO 10% OF CASES.

## 2012-04-02 ENCOUNTER — Ambulatory Visit: Payer: Self-pay | Admitting: Gastroenterology

## 2012-04-03 LAB — PATHOLOGY REPORT

## 2012-12-08 ENCOUNTER — Ambulatory Visit: Payer: Self-pay | Admitting: Gastroenterology

## 2012-12-09 LAB — PATHOLOGY REPORT

## 2013-03-05 ENCOUNTER — Ambulatory Visit: Payer: Self-pay | Admitting: Family Medicine

## 2013-03-05 IMAGING — MG MM DIGITAL SCREENING BILAT W/ CAD
1 series · 4 of 4 positions shown · non-contrast
Comparison: Previous exam(s).

CLINICAL DATA: Screening.

EXAM:
DIGITAL SCREENING BILATERAL MAMMOGRAM WITH CAD

[R CC · right · 4 of 4 slices shown]
[im 1/4]
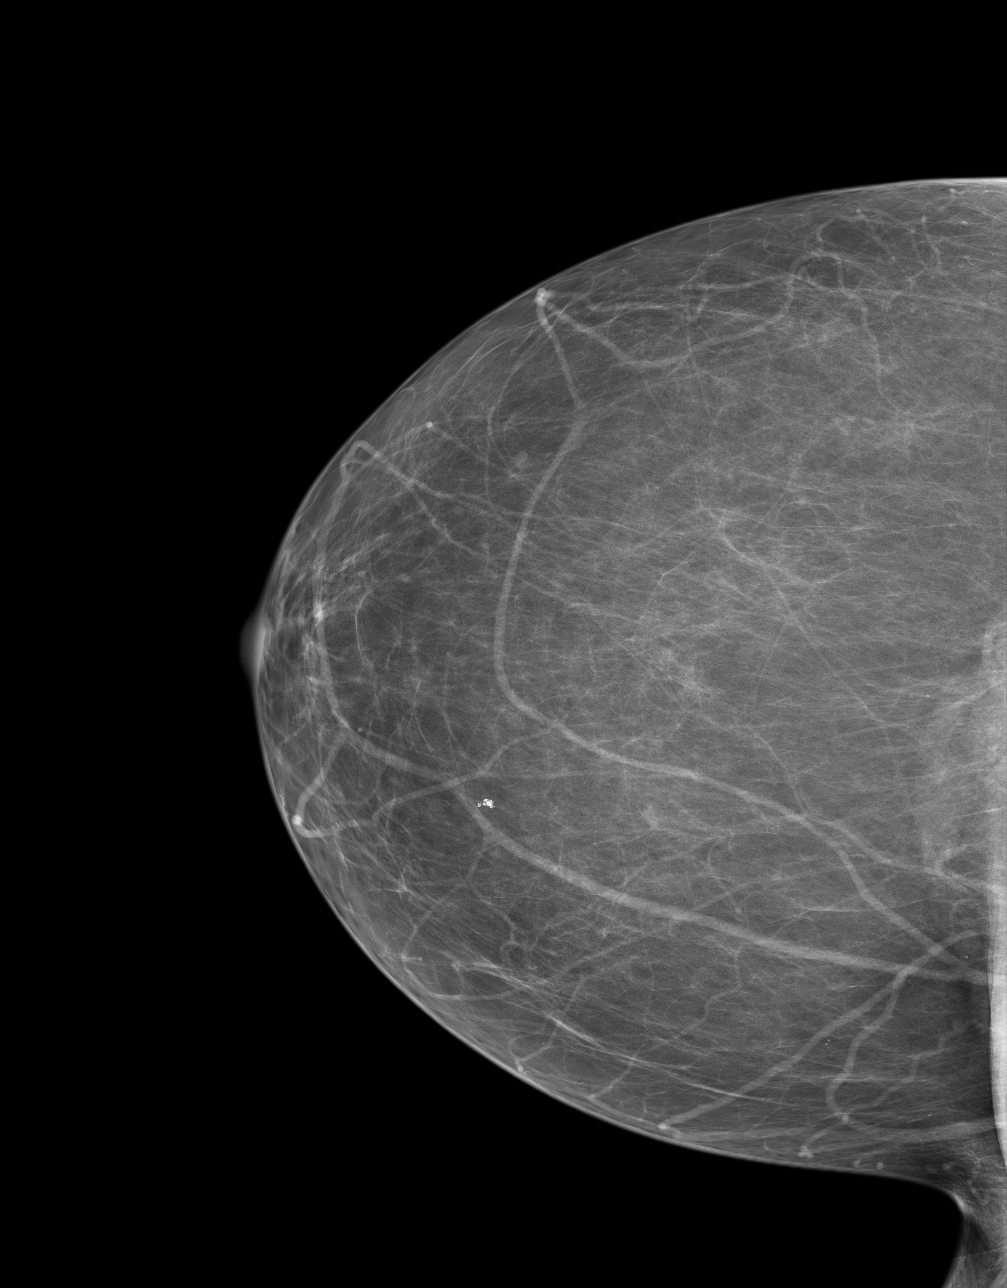
[im 2/4]
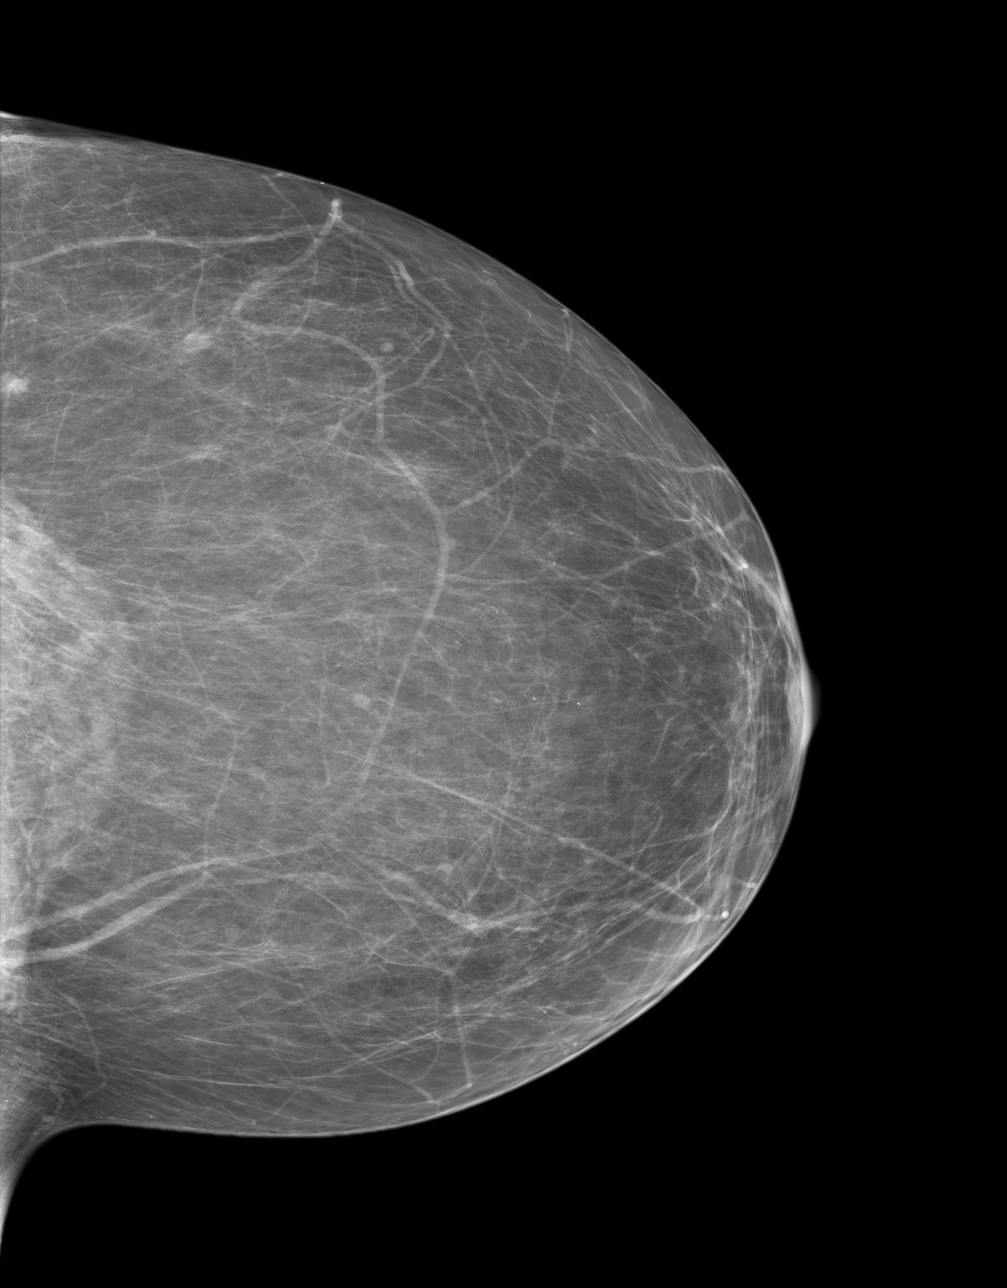
[im 3/4]
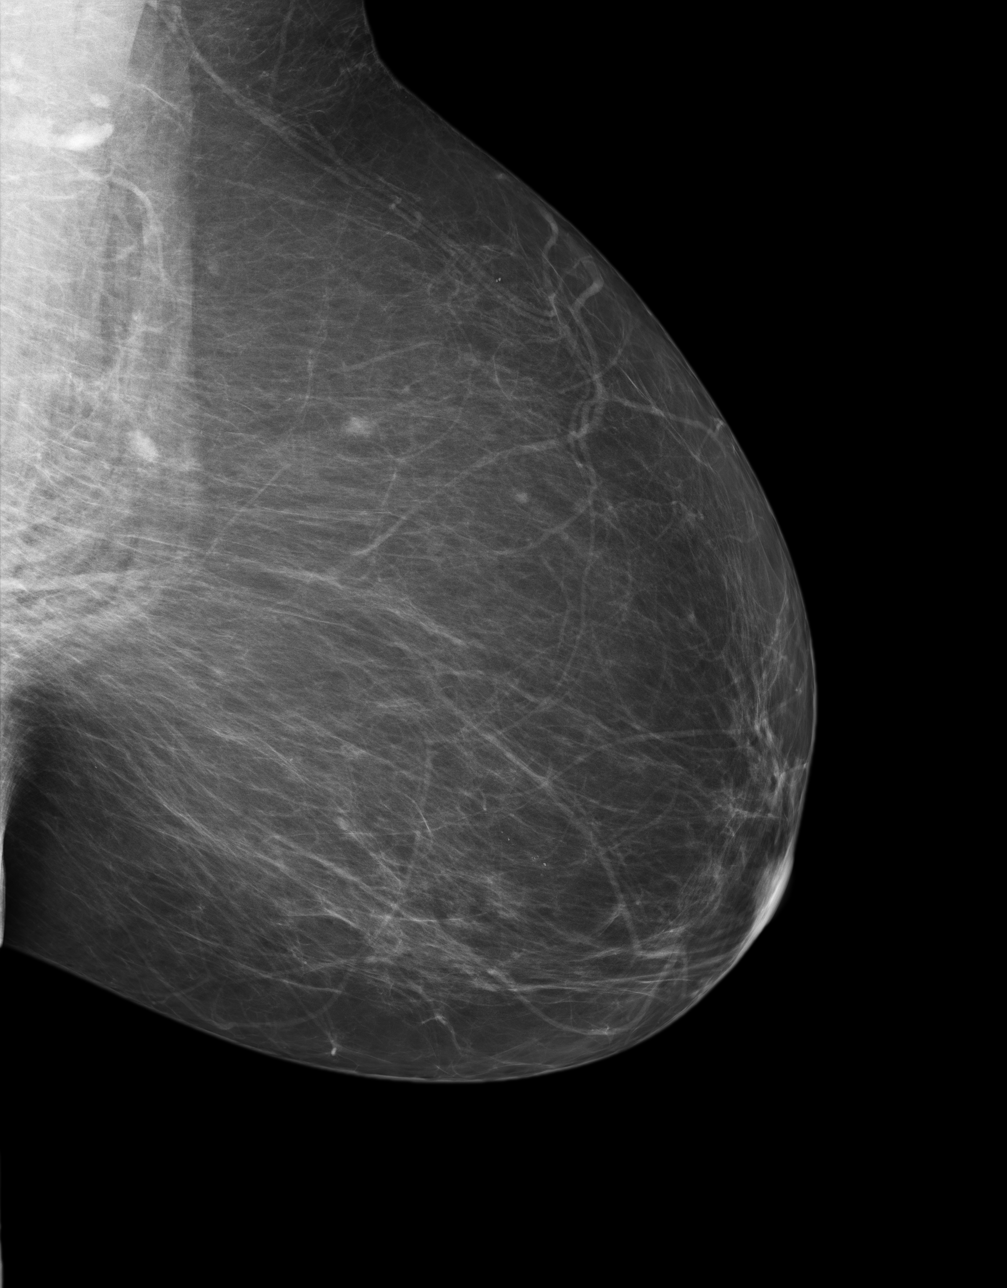
[im 4/4]
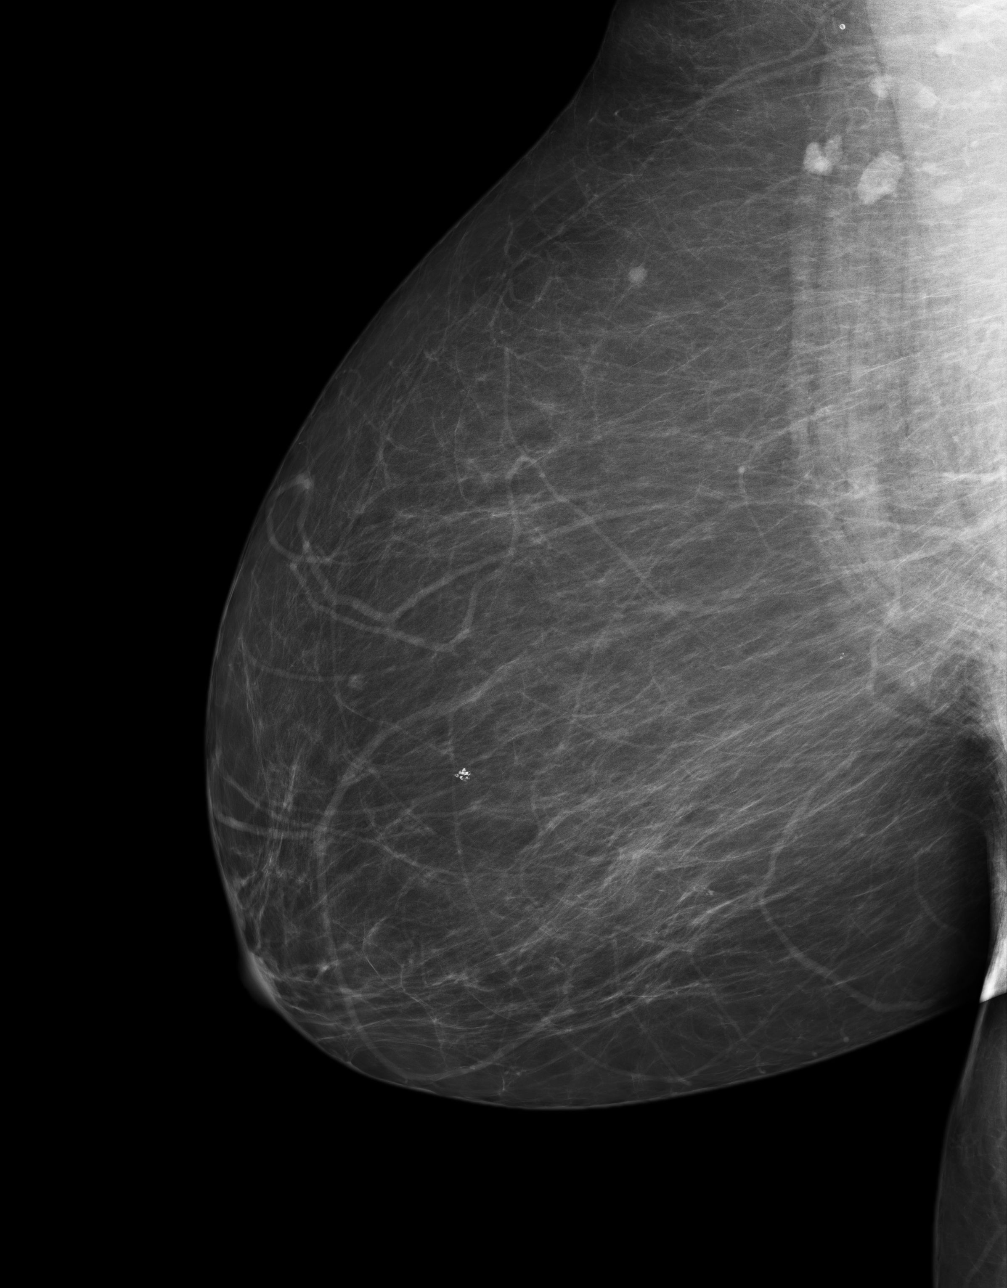

[4 of 4 positions shown; findings below may reference images not displayed]

ACR Breast Density Category b: There are scattered areas of
fibroglandular density.
FINDINGS: There are no findings suspicious for malignancy. Images were
processed with CAD.
IMPRESSION: No mammographic evidence of malignancy. A result letter of this
screening mammogram will be mailed directly to the patient.

RECOMMENDATION:
Screening mammogram in one year. (Code:[US])

BI-RADS CATEGORY  1: Negative.

## 2013-03-26 DIAGNOSIS — N3020 Other chronic cystitis without hematuria: Secondary | ICD-10-CM | POA: Insufficient documentation

## 2013-03-26 DIAGNOSIS — K635 Polyp of colon: Secondary | ICD-10-CM | POA: Insufficient documentation

## 2013-03-26 DIAGNOSIS — M5430 Sciatica, unspecified side: Secondary | ICD-10-CM | POA: Insufficient documentation

## 2013-03-26 DIAGNOSIS — R339 Retention of urine, unspecified: Secondary | ICD-10-CM | POA: Insufficient documentation

## 2013-03-26 DIAGNOSIS — D126 Benign neoplasm of colon, unspecified: Secondary | ICD-10-CM | POA: Insufficient documentation

## 2013-03-26 DIAGNOSIS — B373 Candidiasis of vulva and vagina: Secondary | ICD-10-CM | POA: Insufficient documentation

## 2013-03-26 DIAGNOSIS — N3946 Mixed incontinence: Secondary | ICD-10-CM | POA: Insufficient documentation

## 2013-03-26 DIAGNOSIS — B3731 Acute candidiasis of vulva and vagina: Secondary | ICD-10-CM | POA: Insufficient documentation

## 2013-04-24 ENCOUNTER — Emergency Department: Payer: Self-pay | Admitting: Emergency Medicine

## 2013-04-24 IMAGING — CR DG FOREARM 2V*L*
1 series · 2 of 2 positions shown · non-contrast
Comparison: None.

CLINICAL DATA: Pain post trauma

EXAM:
LEFT FOREARM - 2 VIEW

[Series 1: lat · 0.17mm/px · 2 of 2 slices shown]
[im 1/2]
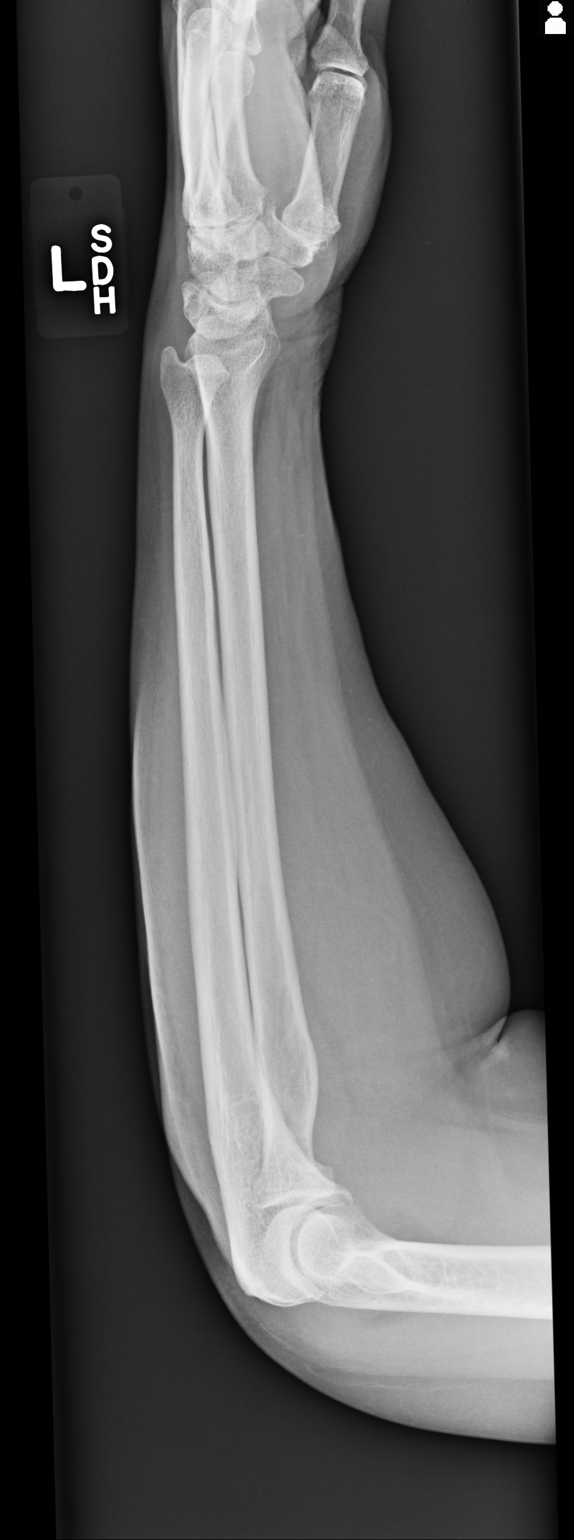
[im 2/2]
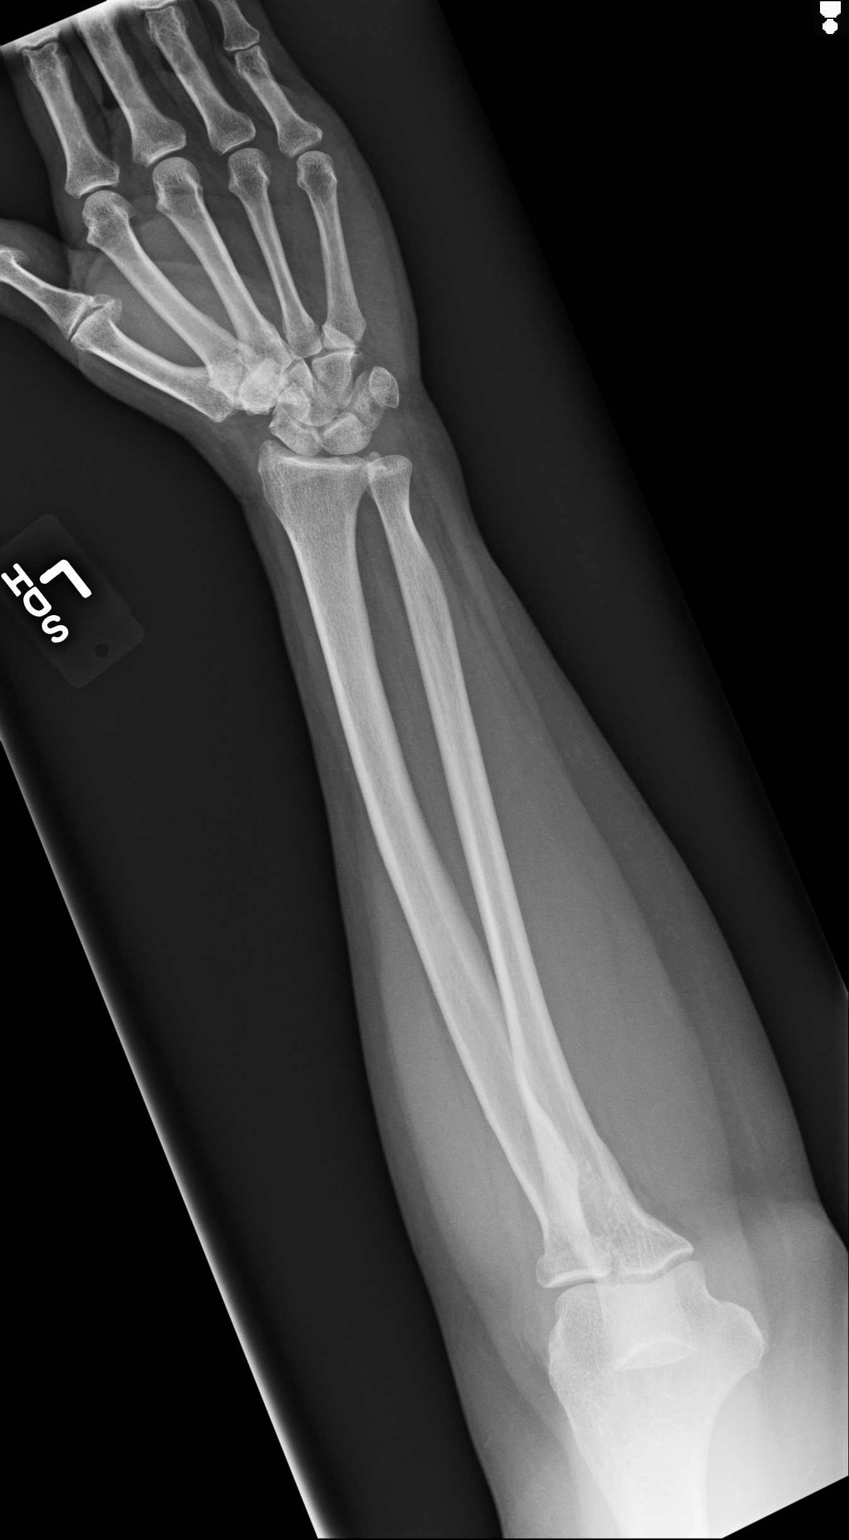

[2 of 2 positions shown; findings below may reference images not displayed]

FINDINGS: Frontal and lateral views were obtained. There is an impaction type
fracture of the proximal radial metaphysis with moderate joint
effusion. No other fracture. No dislocation. Joint spaces appear
intact.
IMPRESSION: Fracture proximal radial metaphysis with joint effusion.

## 2013-04-24 IMAGING — CR RIGHT FOREARM - 2 VIEW
1 series · 2 of 2 positions shown · non-contrast
Comparison: None.

CLINICAL DATA: Recent traumatic injury and pain

EXAM:
RIGHT FOREARM - 2 VIEW

[Series 1: ap · 0.17mm/px · 2 of 2 slices shown]
[im 1/2]
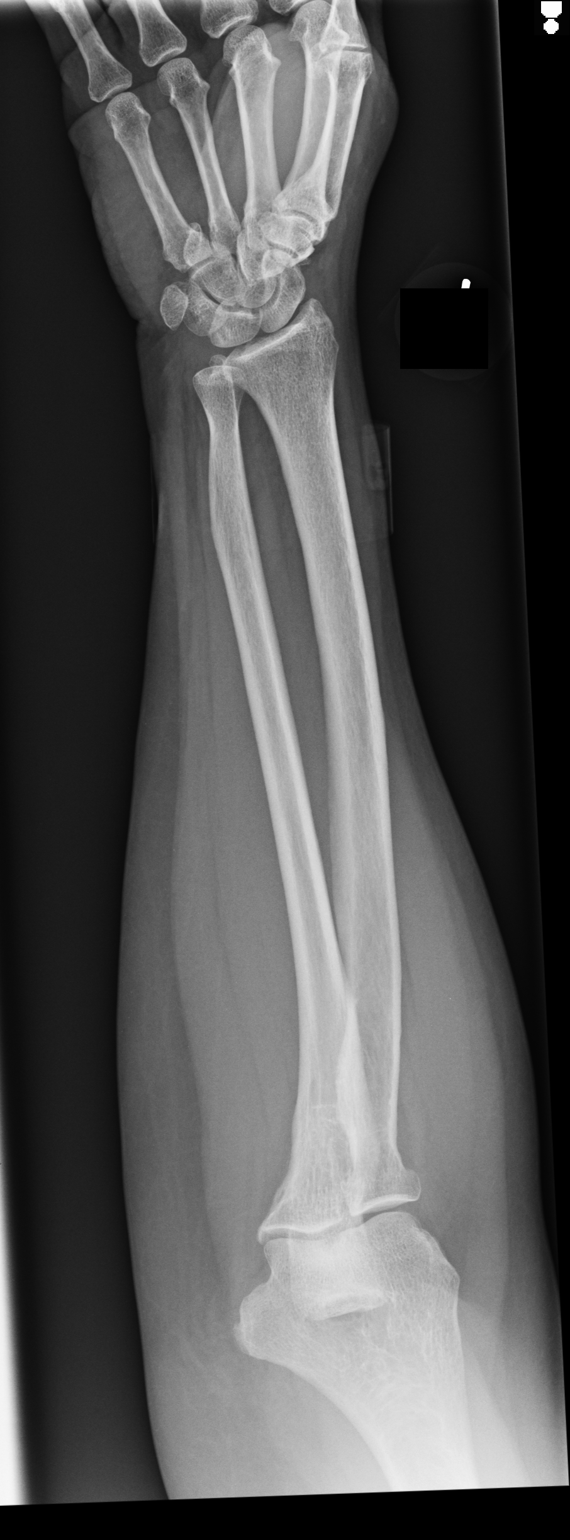
[im 2/2]
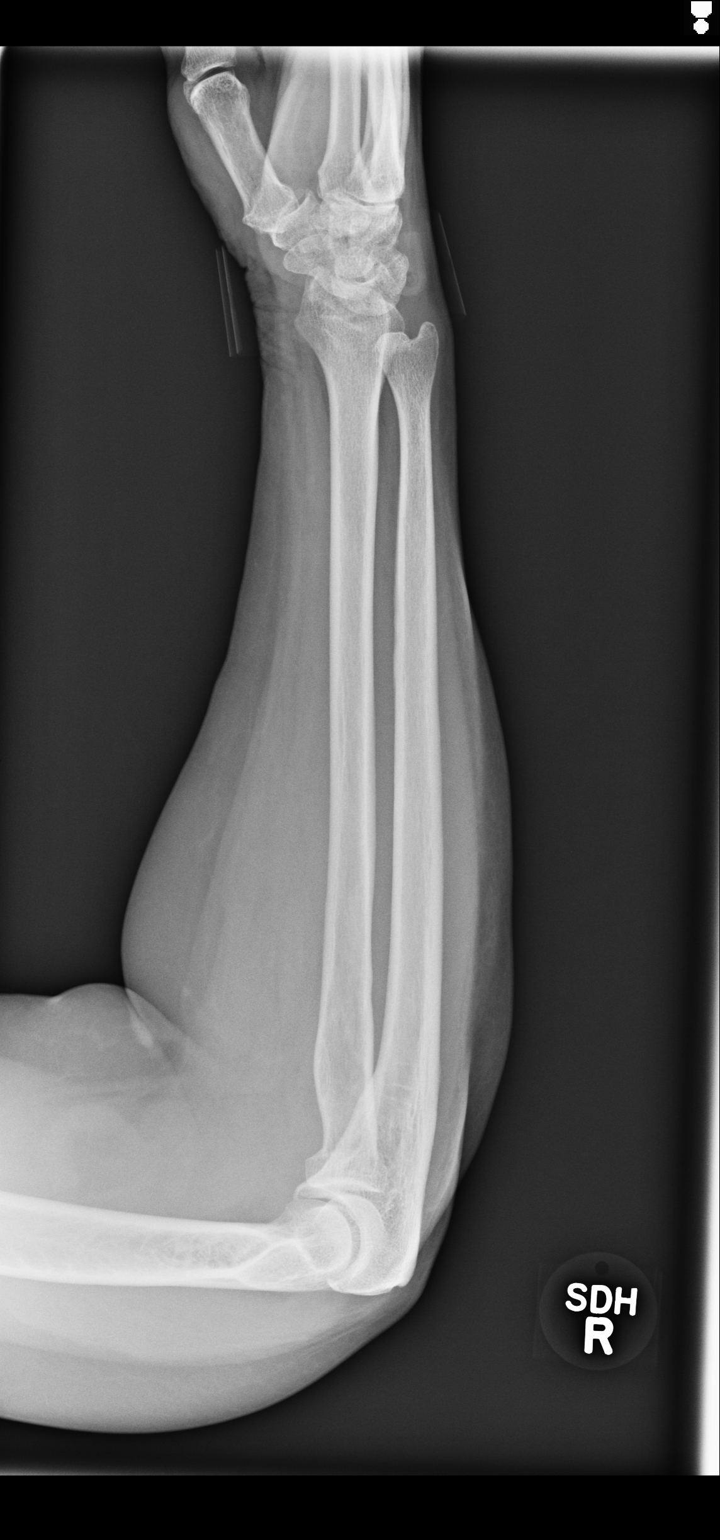

[2 of 2 positions shown; findings below may reference images not displayed]

FINDINGS: No definitive fracture is identified although there is elevation of
the anterior and posterior fat pads consistent with joint effusion.
This is suspicious for radial head fracture and further evaluation
by means of dedicated elbow films is recommended.
IMPRESSION: Joint effusion in the elbow suggestive of possible fracture.
Dedicated elbow films are recommended.

## 2013-04-24 IMAGING — CR RIGHT ELBOW - COMPLETE 3+ VIEW
1 series · 6 of 6 positions shown · non-contrast
Comparison: None.

CLINICAL DATA: Right elbow pain.

EXAM:
RIGHT ELBOW - COMPLETE 3+ VIEW

[Series 1: lat · 0.17mm/px · 6 of 6 slices shown]
[im 1/6]
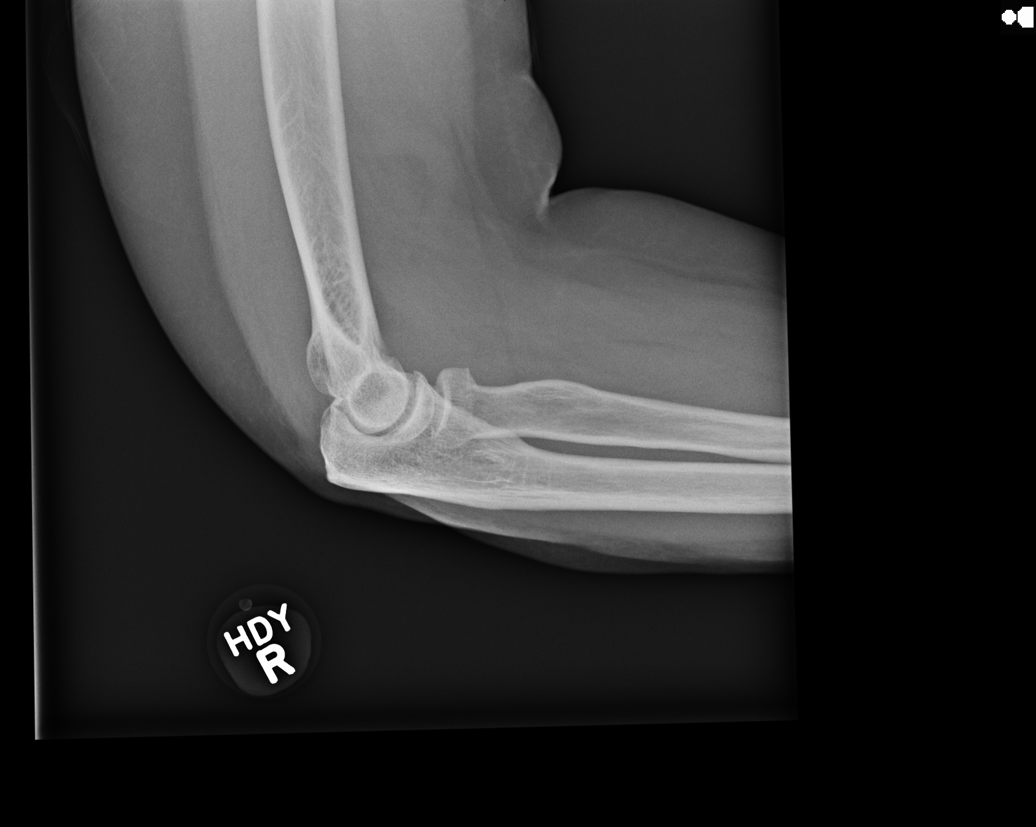
[im 2/6]
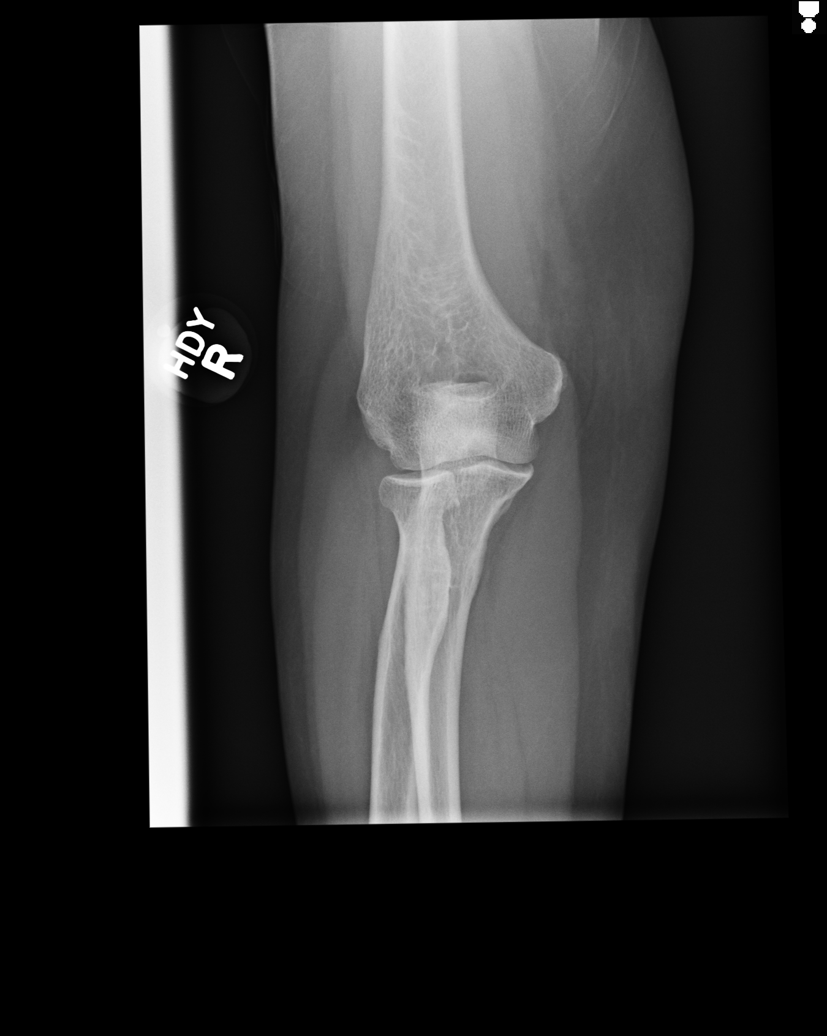
[im 3/6]
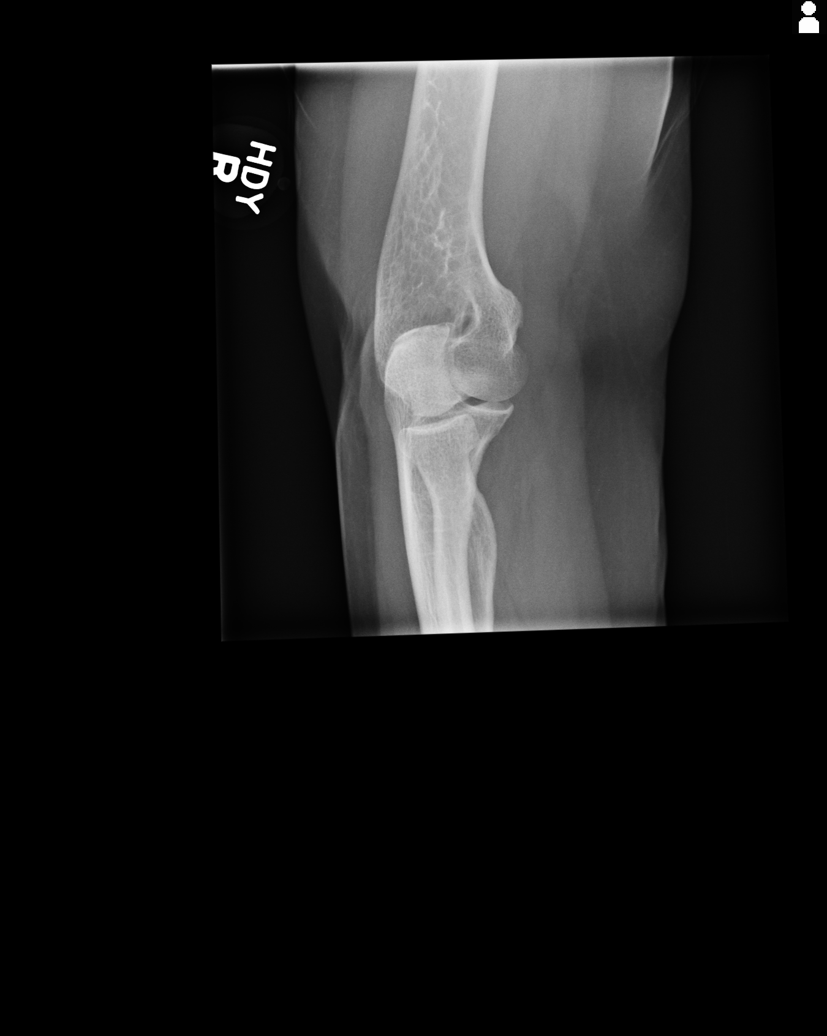
[im 4/6]
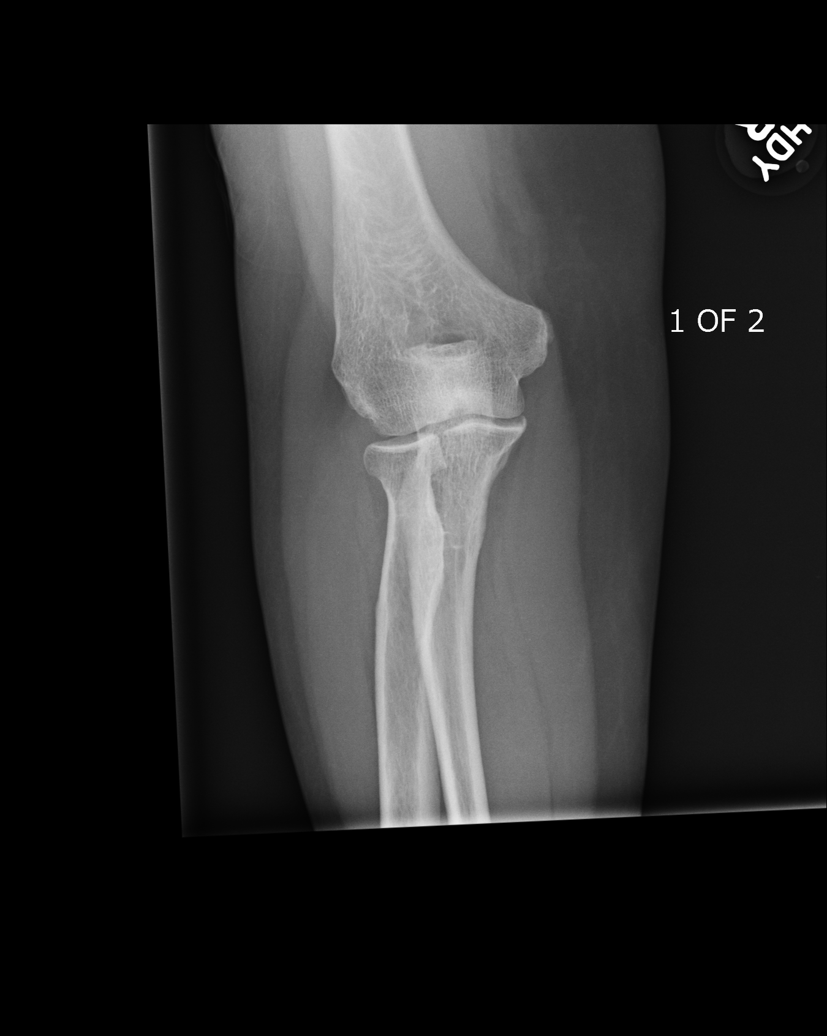
[im 5/6]
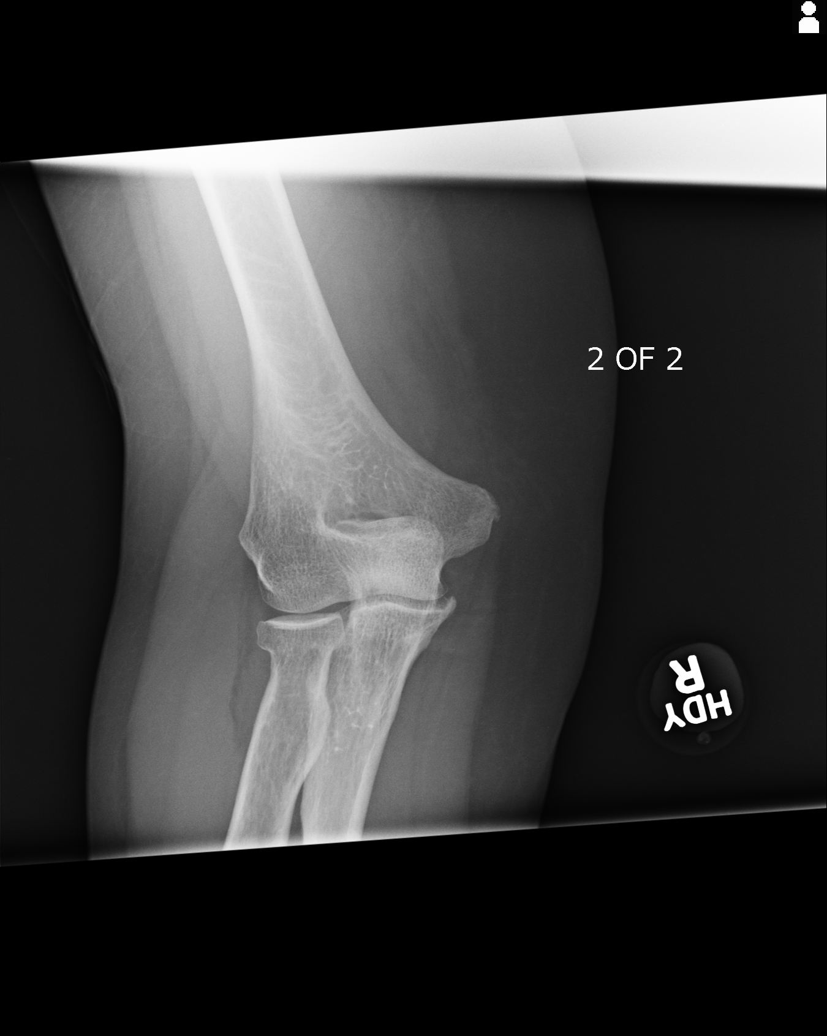
[im 6/6]
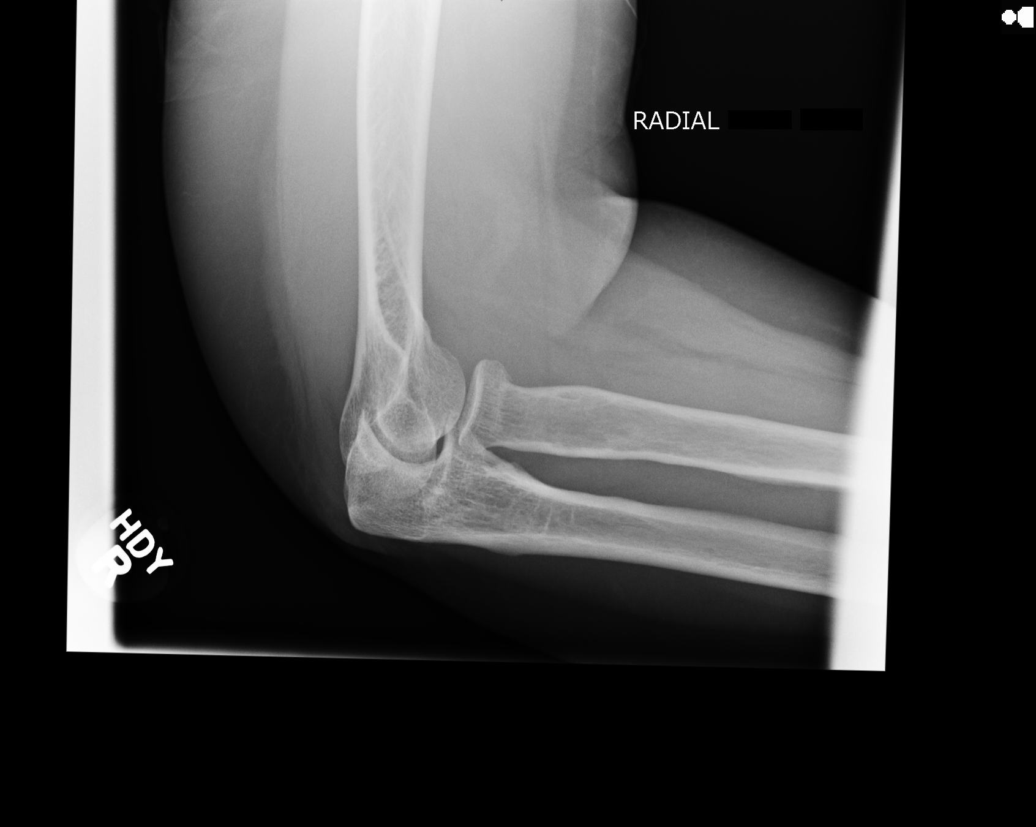

[6 of 6 positions shown; findings below may reference images not displayed]

FINDINGS: The joint spaces are maintained. Mild degenerative changes. There is
a small elbow joint effusion and a slightly impacted radial neck
fracture.
IMPRESSION: Slightly impacted radial neck fracture and small associated elbow
joint effusion.

## 2013-06-29 DIAGNOSIS — R9439 Abnormal result of other cardiovascular function study: Secondary | ICD-10-CM | POA: Insufficient documentation

## 2013-06-29 DIAGNOSIS — I10 Essential (primary) hypertension: Secondary | ICD-10-CM | POA: Insufficient documentation

## 2013-07-08 ENCOUNTER — Ambulatory Visit: Payer: Self-pay | Admitting: Cardiology

## 2013-09-02 ENCOUNTER — Ambulatory Visit: Payer: Self-pay | Admitting: Gastroenterology

## 2013-09-02 DIAGNOSIS — Z86010 Personal history of colonic polyps: Secondary | ICD-10-CM | POA: Insufficient documentation

## 2013-09-02 IMAGING — CR DG ABDOMEN 2V
1 series · 2 of 2 positions shown · non-contrast
Comparison: None.

CLINICAL DATA: Abdominal pain.  History of gallbladder stent.

EXAM:
ABDOMEN - 2 VIEW

[Series 3: t abdomen supine · 0.14mm/px · 2 of 2 slices shown]
[im 1/2]
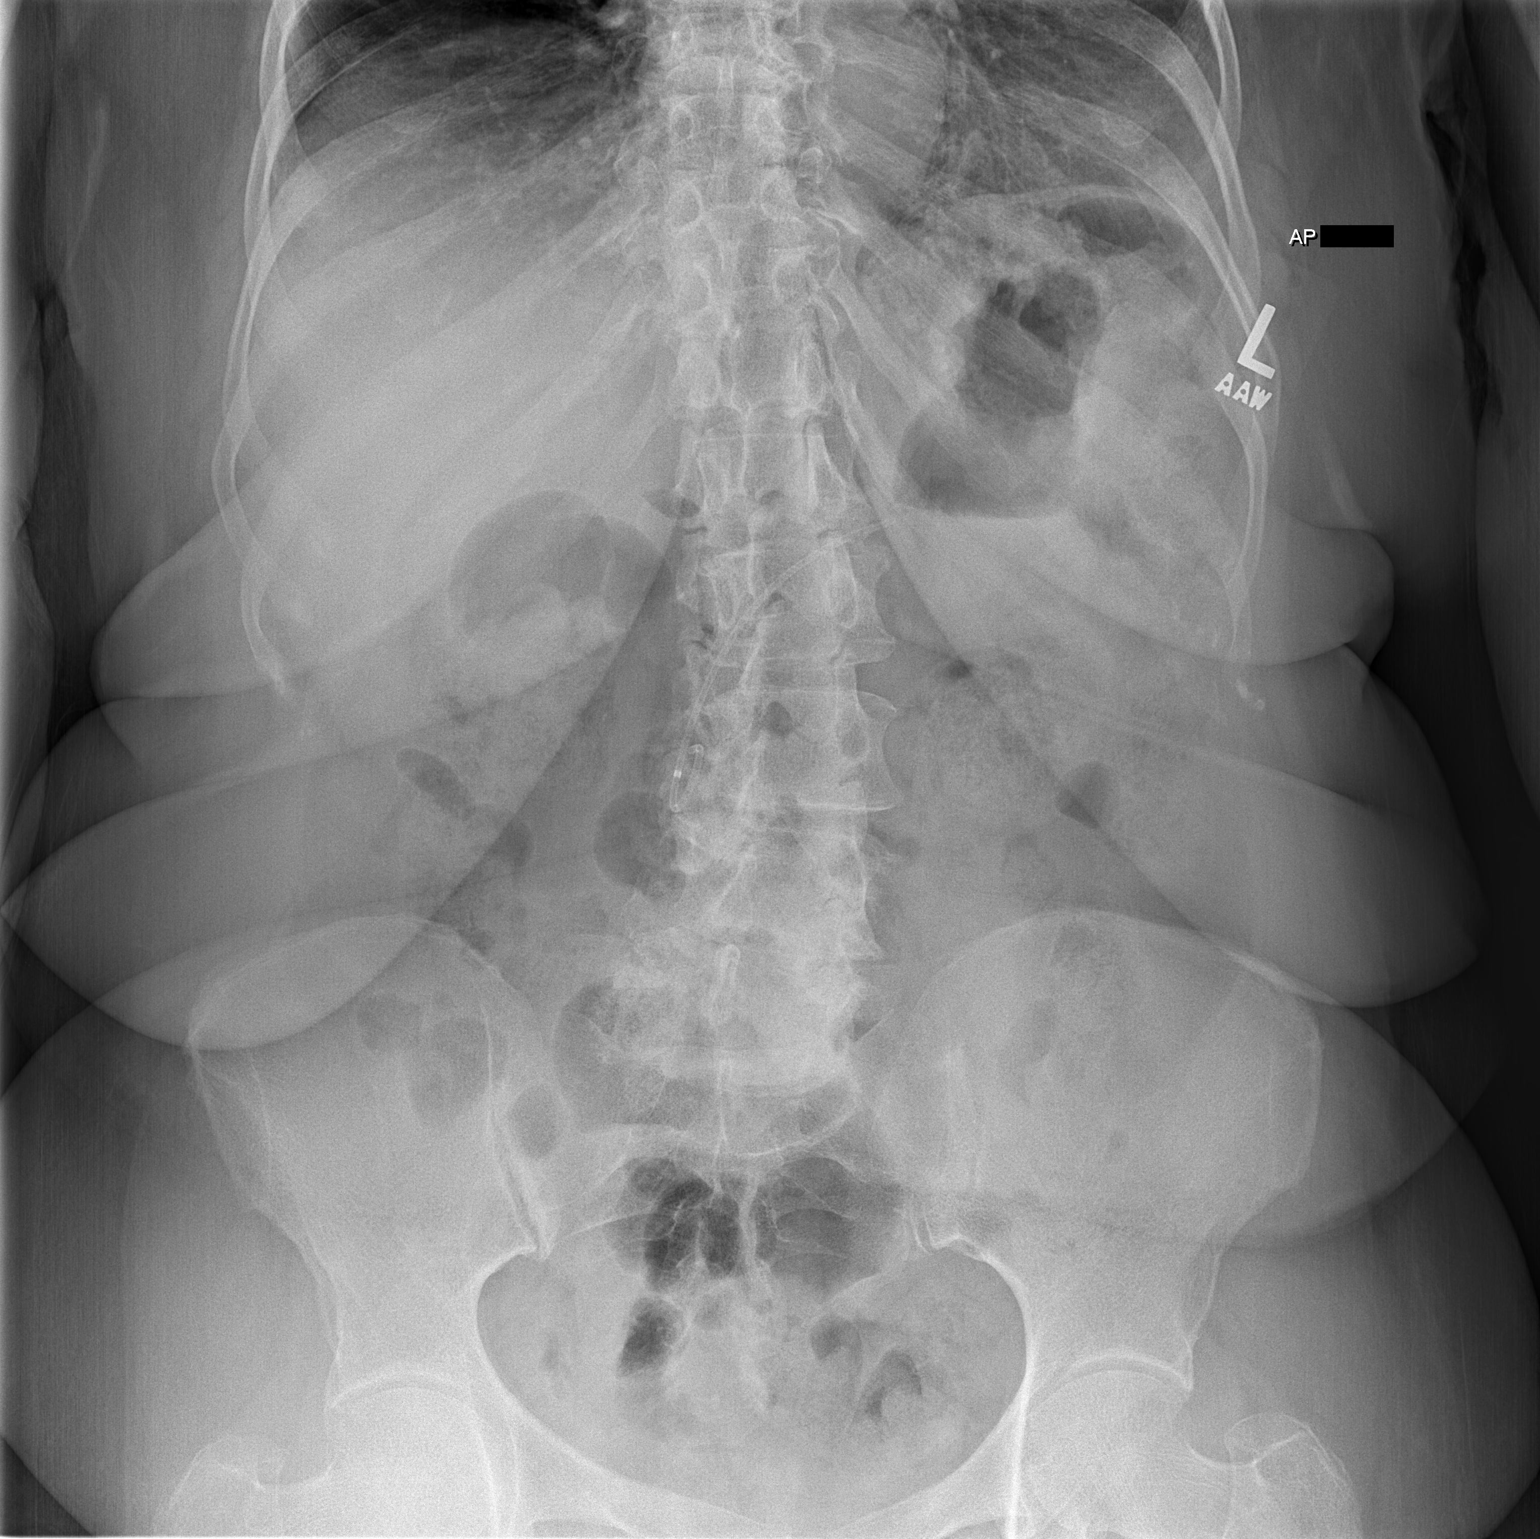
[im 2/2]
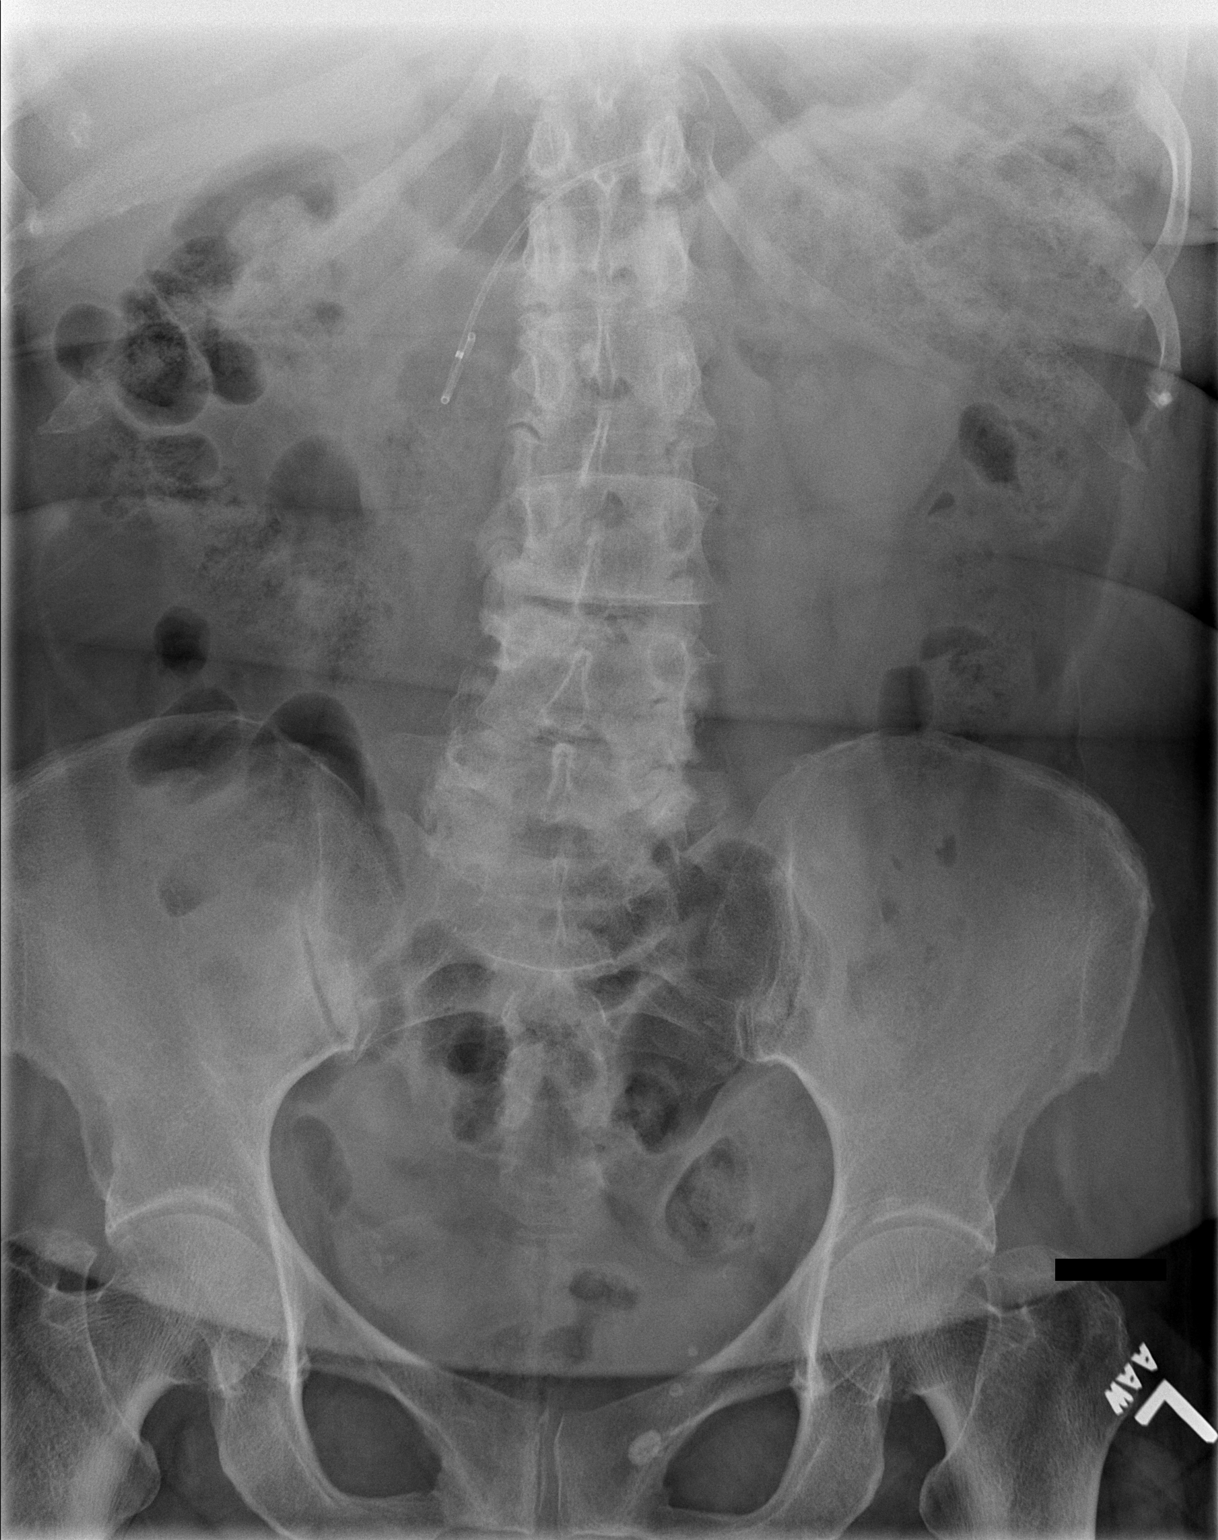

[2 of 2 positions shown; findings below may reference images not displayed]

FINDINGS: Stool is present throughout the colon. Gas is demonstrated in
nondilated loops of large and small bowel. No evidence for small
bowel obstruction. No pneumatosis or portal venous gas. No evidence
for free intraperitoneal air.

Stent material with the distal coiled end demonstrated projecting
over the central abdomen.

Lower lumbar spine degenerative change.
IMPRESSION: Stent material is demonstrated projecting over the central abdomen
with the coiled tip projecting along the right aspect of the lumbar
spine. No prior comparisons are available to evaluate for
initial/expected location of the stent. The exact location is not
able to be determined on single AP radiograph however if this stent
is supposed to represent a biliary stent, this would be an incorrect
location as no aspect of the stent projects over the expected
location of the gallbladder or biliary tree. The main aspect of the
stent could potentially be localized to the pancreas. Recommend
correlation with expected location of the stent and prior
radiographs if available. Cross-sectional imaging would prove
helpful as clinically indicated.

Stool throughout the colon as can be seen with constipation.

## 2014-03-11 ENCOUNTER — Ambulatory Visit: Payer: Self-pay | Admitting: Family Medicine

## 2014-03-11 IMAGING — MG MM DIGITAL SCREENING BILAT W/ TOMO W/ CAD
8 of 12 series · 8 of 28 positions shown · non-contrast
Comparison: Previous exam(s).

ACR Breast Density Category a: The breast tissue is almost entirely
fatty.

CLINICAL DATA: Screening.

EXAM:
DIGITAL SCREENING BILATERAL MAMMOGRAM WITH 3D TOMO WITH CAD

[R CC synth-2D]
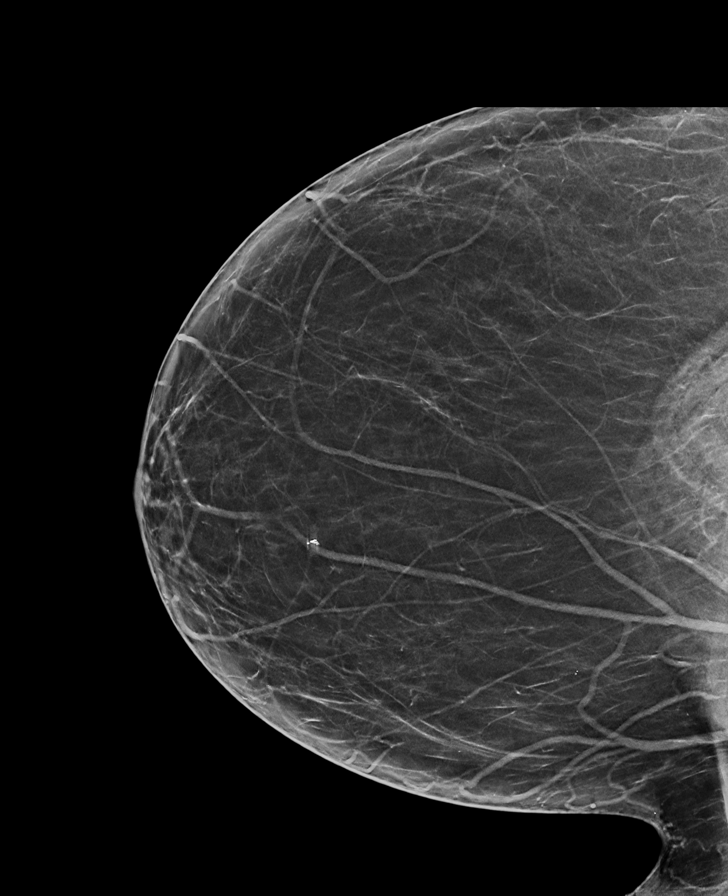

[L CC]
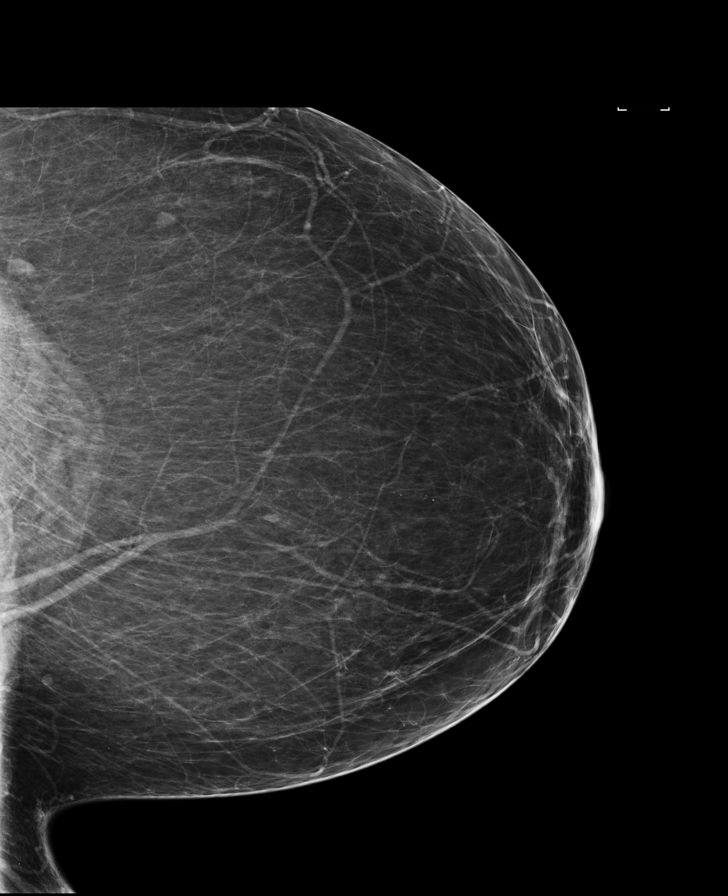

[R CC]
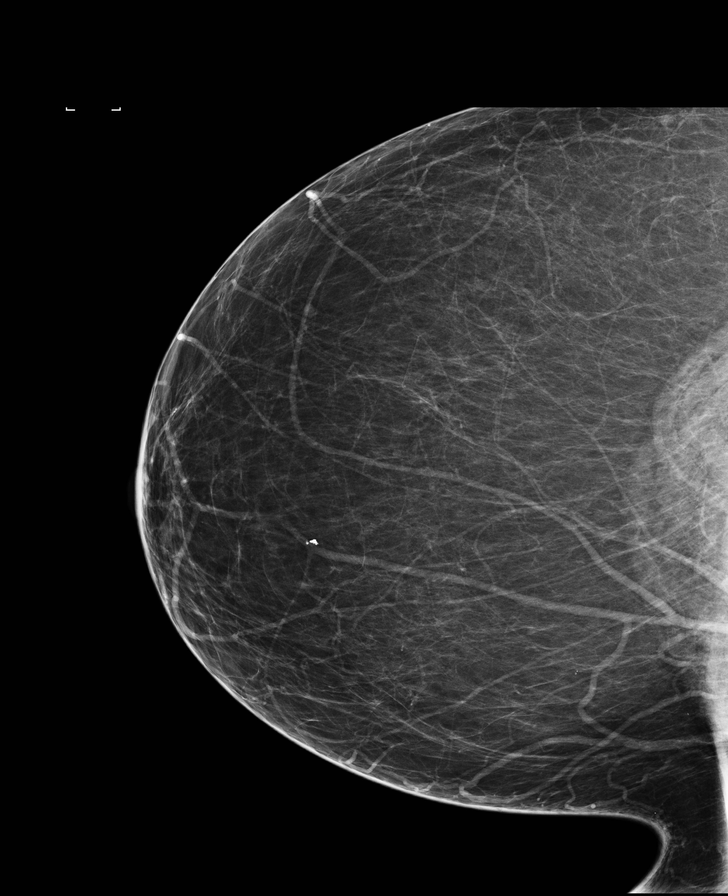

[L MLO synth-2D]
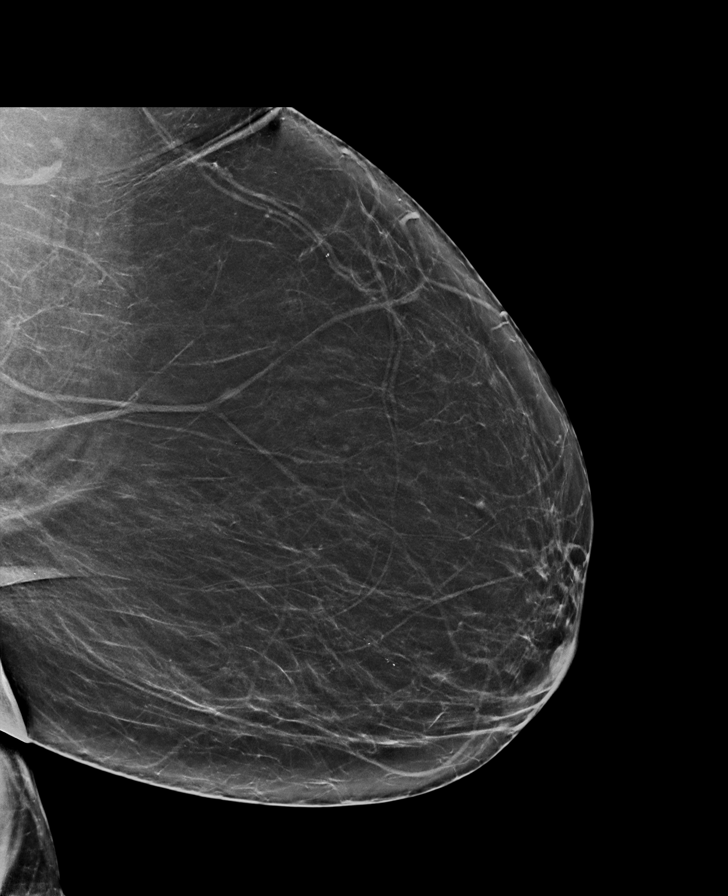

[R MLO synth-2D]
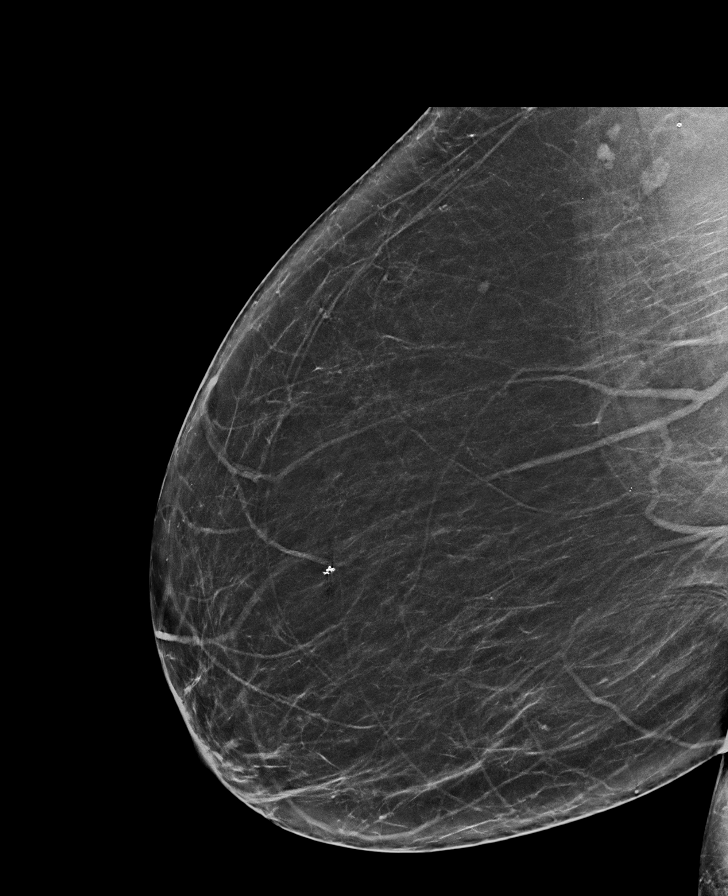

[R MLO]
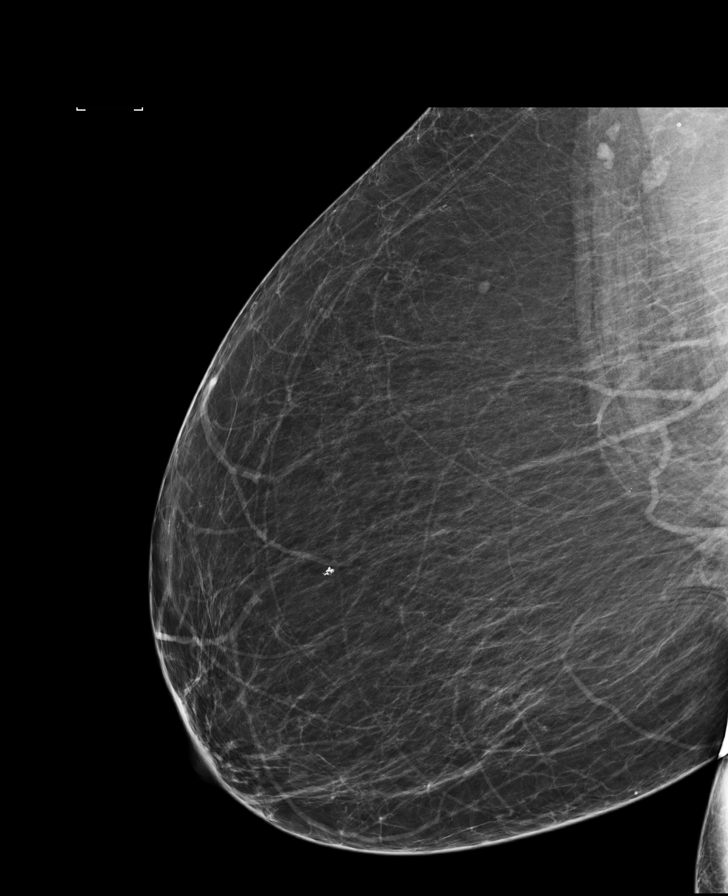

[L CC synth-2D]
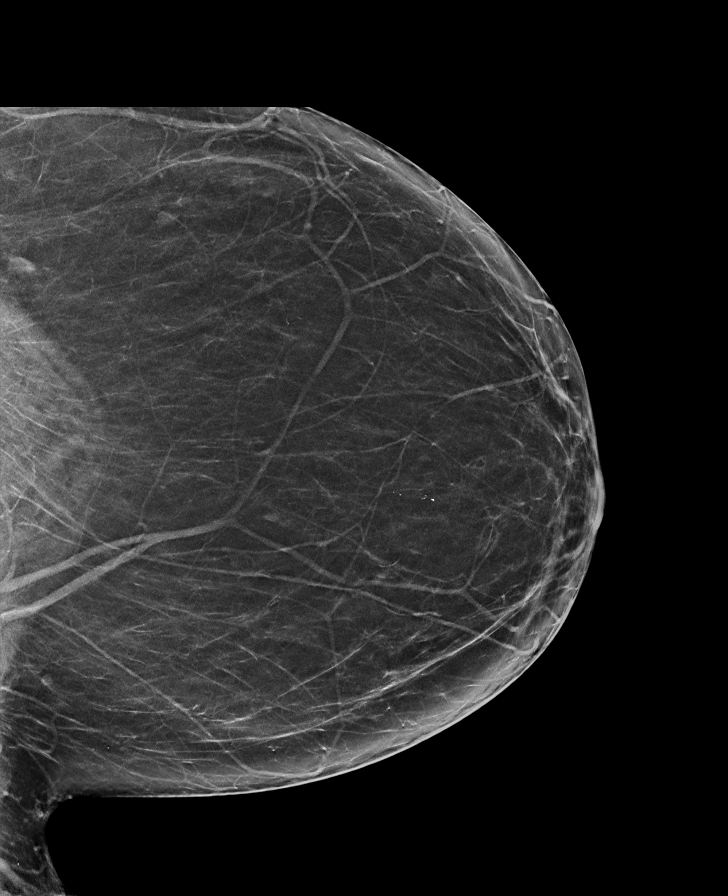

[L MLO]
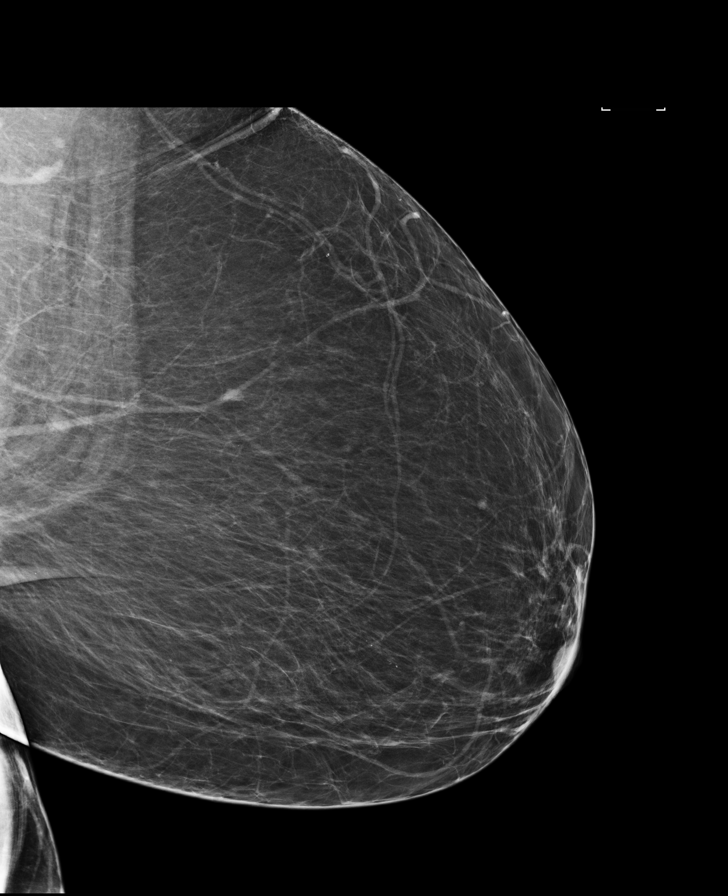

[8 of 28 positions shown; findings below may reference images not displayed]

FINDINGS: There are no findings suspicious for malignancy. Images were
processed with CAD.
IMPRESSION: No mammographic evidence of malignancy. A result letter of this
screening mammogram will be mailed directly to the patient.

RECOMMENDATION:
Screening mammogram in one year. (Code:[PR])

BI-RADS CATEGORY  1: Negative.

## 2014-03-22 ENCOUNTER — Ambulatory Visit: Payer: Self-pay | Admitting: Gastroenterology

## 2014-05-16 LAB — SURGICAL PATHOLOGY

## 2014-09-20 ENCOUNTER — Other Ambulatory Visit: Payer: Self-pay | Admitting: Gastroenterology

## 2014-09-20 DIAGNOSIS — R1013 Epigastric pain: Secondary | ICD-10-CM

## 2014-09-20 DIAGNOSIS — R109 Unspecified abdominal pain: Secondary | ICD-10-CM

## 2014-09-20 DIAGNOSIS — R300 Dysuria: Secondary | ICD-10-CM

## 2014-09-28 ENCOUNTER — Ambulatory Visit
Admission: RE | Admit: 2014-09-28 | Discharge: 2014-09-28 | Disposition: A | Discharge status: Home / Self Care | Payer: Medicare Other | Source: Ambulatory Visit | Attending: Gastroenterology | Admitting: Gastroenterology

## 2014-09-28 DIAGNOSIS — R1013 Epigastric pain: Secondary | ICD-10-CM | POA: Diagnosis present

## 2014-09-28 DIAGNOSIS — J9811 Atelectasis: Secondary | ICD-10-CM | POA: Insufficient documentation

## 2014-09-28 DIAGNOSIS — J479 Bronchiectasis, uncomplicated: Secondary | ICD-10-CM | POA: Diagnosis not present

## 2014-09-28 DIAGNOSIS — R300 Dysuria: Secondary | ICD-10-CM | POA: Insufficient documentation

## 2014-09-28 DIAGNOSIS — R109 Unspecified abdominal pain: Secondary | ICD-10-CM | POA: Insufficient documentation

## 2014-09-28 IMAGING — CT CT RENAL STONE PROTOCOL
2 of 4 series · 16 of 46 positions shown, 18 images · non-contrast
Comparison: None.

CLINICAL DATA: LEFT flank pain since [DATE], dysuria, abdominal
distension, dyspepsia

EXAM:
CT ABDOMEN AND PELVIS WITHOUT CONTRAST
TECHNIQUE: Multidetector CT imaging of the abdomen and pelvis was performed
following the standard protocol without IV contrast. Sagittal and
coronal MPR images reconstructed from axial data set. Oral contrast
not administered for this indication

[Series 2: stone · axial · 0.68mm/px · z∈[-905,-500]mm · 13 of 89 slices shown, 15 images]
[im 4/89  soft-tissue]
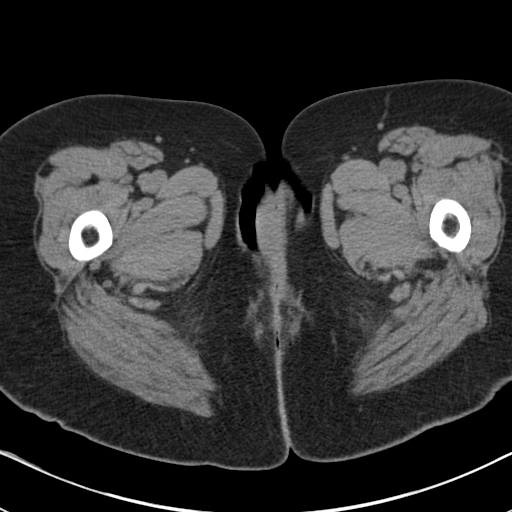
[im 4/89  bone]
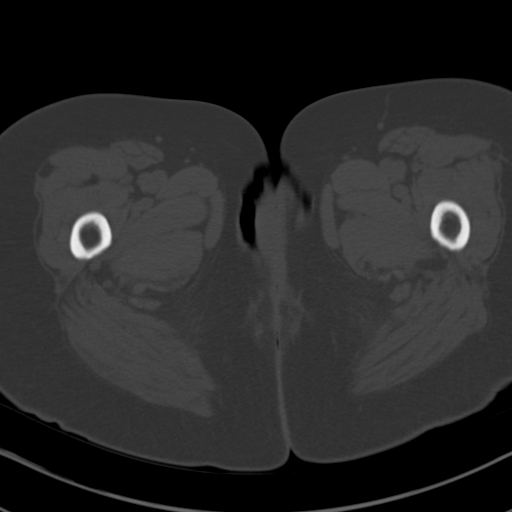
[im 12/89  soft-tissue]
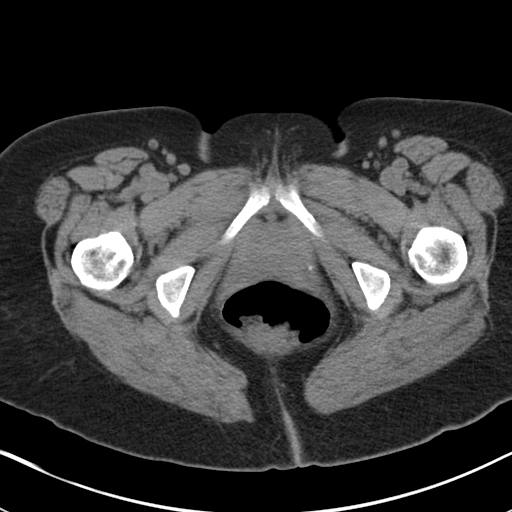
[im 19/89  soft-tissue]
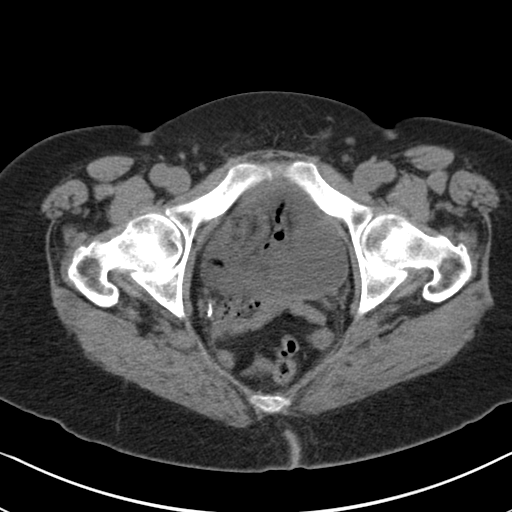
[im 26/89  soft-tissue]
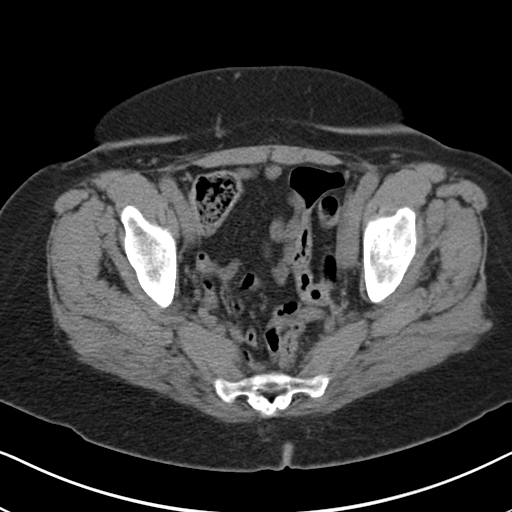
[im 30/89  soft-tissue]
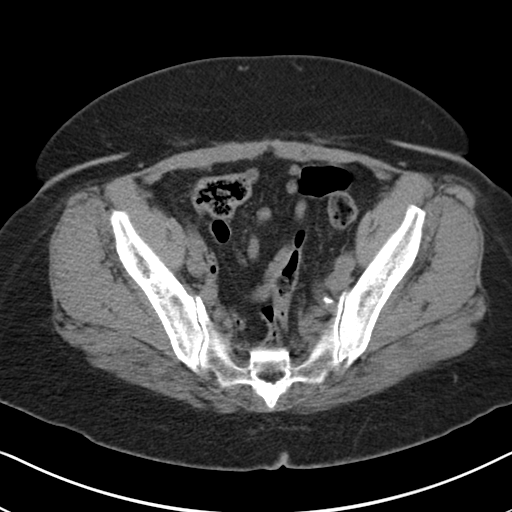
[im 37/89  soft-tissue]
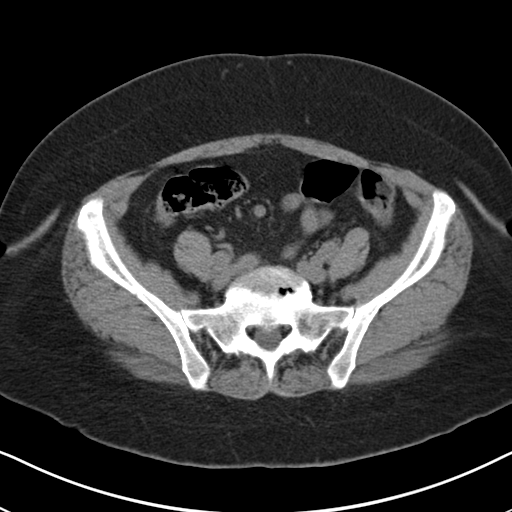
[im 45/89  soft-tissue]
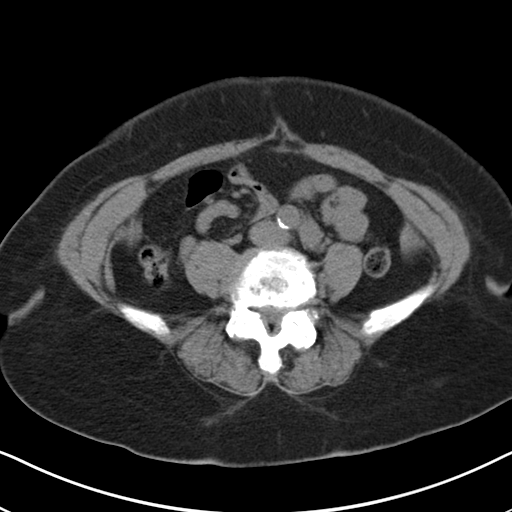
[im 52/89  soft-tissue]
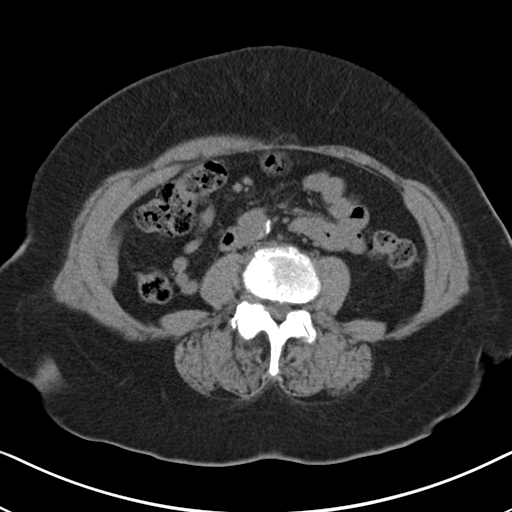
[im 59/89  soft-tissue]
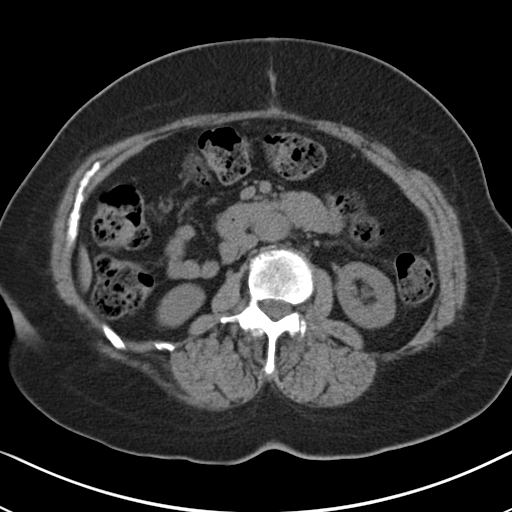
[im 59/89  bone]
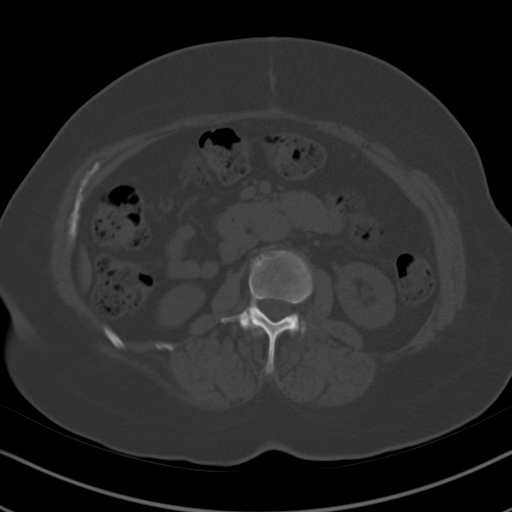
[im 63/89  soft-tissue]
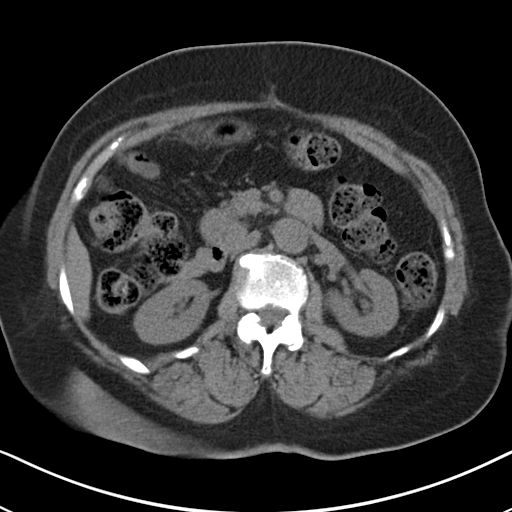
[im 70/89  soft-tissue]
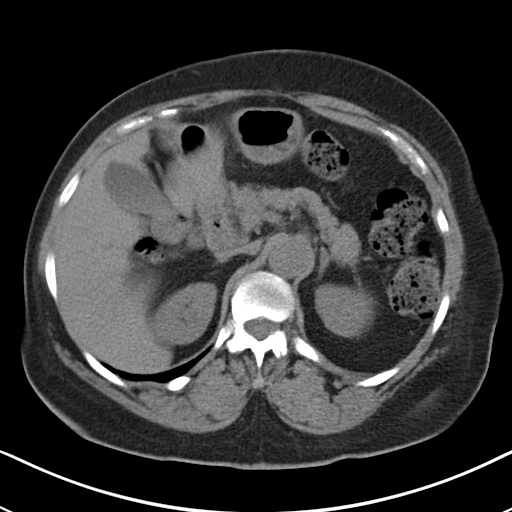
[im 78/89  soft-tissue]
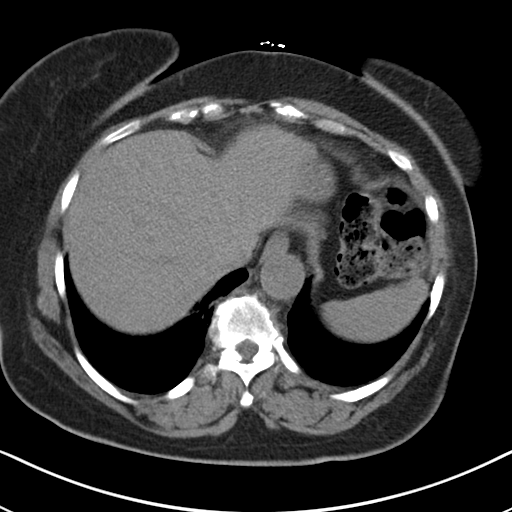
[im 85/89  soft-tissue]
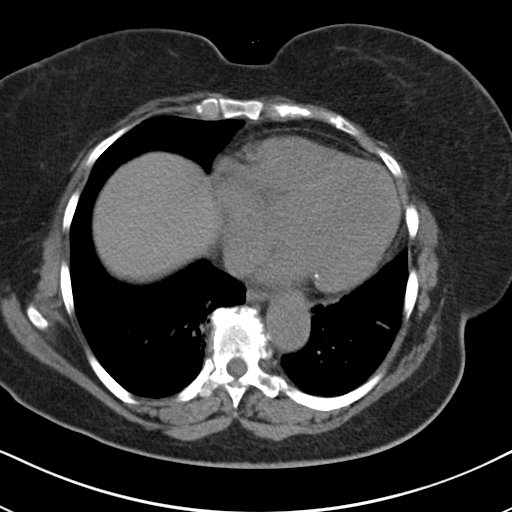

[Series 5: cor · coronal · 0.71mm/px · 3 of 151 slices shown]
[im 51/151  soft-tissue]
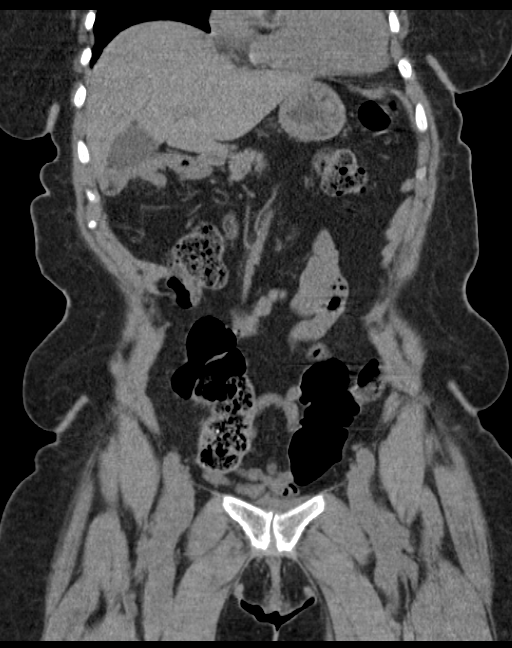
[im 67/151  soft-tissue]
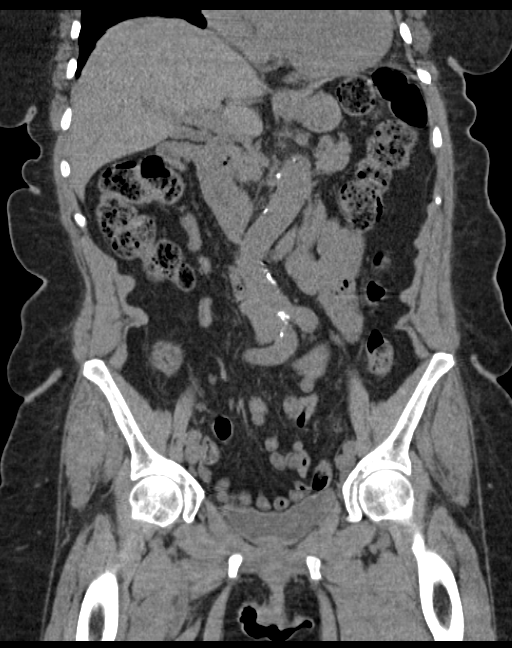
[im 84/151  soft-tissue]
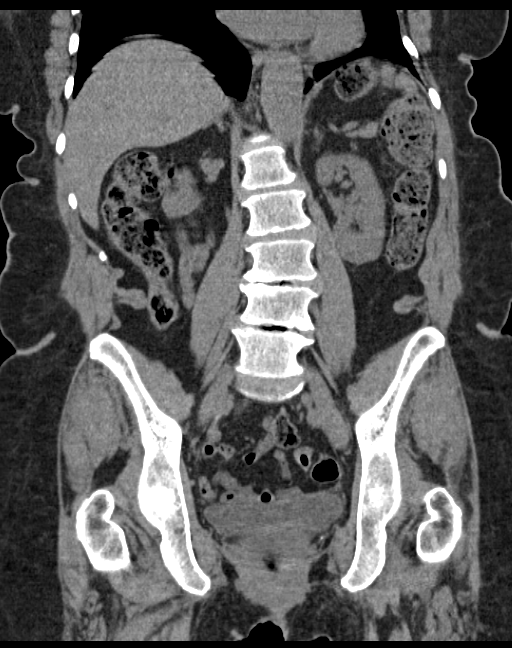

[16 of 46 positions shown; findings below may reference images not displayed]

FINDINGS: Minimal bibasilar atelectasis and lower lobe bronchiectasis.

Unremarkable noncontrast CT appearance of the kidneys.

No urinary tract calcification, hydronephrosis or ureteral
dilatation.

Within limits of a nonenhanced exam no focal abnormalities of the
liver, gallbladder, spleen, pancreas, or adrenal glands.

Stool throughout colon.

Stomach and bowel loops otherwise normal appearance.

Normal appearing bladder and ureters.

Uterus surgically absent with nonvisualization of ovaries.

Normal appendix.

Scattered atherosclerotic calcifications without aneurysm.

No mass, adenopathy, free fluid, or free air.

Degenerative disc and facet disease changes thoracolumbar spine.
IMPRESSION: No acute intra-abdominal or intrapelvic abnormalities.

Minimal bibasilar atelectasis and lower lobe bronchiectasis.

## 2014-10-24 ENCOUNTER — Encounter: Payer: Self-pay | Admitting: *Deleted

## 2014-10-25 ENCOUNTER — Ambulatory Visit
Admission: RE | Admit: 2014-10-25 | Discharge: 2014-10-25 | Disposition: A | Discharge status: Home / Self Care | Payer: Medicare Other | Source: Ambulatory Visit | Attending: Gastroenterology | Admitting: Gastroenterology

## 2014-10-25 ENCOUNTER — Encounter: Payer: Self-pay | Admitting: *Deleted

## 2014-10-25 ENCOUNTER — Ambulatory Visit: Payer: Medicare Other | Admitting: Anesthesiology

## 2014-10-25 ENCOUNTER — Encounter
Admission: RE | Disposition: A | Discharge status: Home / Self Care | Payer: Self-pay | Source: Ambulatory Visit | Attending: Gastroenterology

## 2014-10-25 DIAGNOSIS — E785 Hyperlipidemia, unspecified: Secondary | ICD-10-CM | POA: Insufficient documentation

## 2014-10-25 DIAGNOSIS — Z8719 Personal history of other diseases of the digestive system: Secondary | ICD-10-CM | POA: Diagnosis not present

## 2014-10-25 DIAGNOSIS — I10 Essential (primary) hypertension: Secondary | ICD-10-CM | POA: Diagnosis not present

## 2014-10-25 DIAGNOSIS — K2970 Gastritis, unspecified, without bleeding: Secondary | ICD-10-CM | POA: Diagnosis not present

## 2014-10-25 DIAGNOSIS — K317 Polyp of stomach and duodenum: Secondary | ICD-10-CM | POA: Diagnosis present

## 2014-10-25 DIAGNOSIS — Z79899 Other long term (current) drug therapy: Secondary | ICD-10-CM | POA: Insufficient documentation

## 2014-10-25 DIAGNOSIS — K319 Disease of stomach and duodenum, unspecified: Secondary | ICD-10-CM | POA: Diagnosis not present

## 2014-10-25 DIAGNOSIS — Z7982 Long term (current) use of aspirin: Secondary | ICD-10-CM | POA: Diagnosis not present

## 2014-10-25 HISTORY — DX: Diverticulosis of intestine, part unspecified, without perforation or abscess without bleeding: K57.90

## 2014-10-25 HISTORY — DX: Hyperlipidemia, unspecified: E78.5

## 2014-10-25 HISTORY — PX: ESOPHAGOGASTRODUODENOSCOPY (EGD) WITH PROPOFOL: SHX5813

## 2014-10-25 HISTORY — DX: Essential (primary) hypertension: I10

## 2014-10-25 HISTORY — DX: Unspecified osteoarthritis, unspecified site: M19.90

## 2014-10-25 HISTORY — DX: Benign neoplasm of duodenum: D13.2

## 2014-10-25 HISTORY — DX: Anemia, unspecified: D64.9

## 2014-10-25 SURGERY — ESOPHAGOGASTRODUODENOSCOPY (EGD) WITH PROPOFOL
Anesthesia: General

## 2014-10-25 MED ORDER — FENTANYL CITRATE (PF) 100 MCG/2ML IJ SOLN
INTRAMUSCULAR | Status: DC | PRN
  Administered 2014-10-25: 50 ug via INTRAVENOUS

## 2014-10-25 MED ORDER — SODIUM CHLORIDE 0.9 % IV SOLN: INTRAVENOUS | Status: DC

## 2014-10-25 MED ORDER — PROPOFOL 500 MG/50ML IV EMUL
INTRAVENOUS | Status: DC | PRN
  Administered 2014-10-25: 140 ug/kg/min via INTRAVENOUS

## 2014-10-25 MED ORDER — LIDOCAINE HCL (CARDIAC) 20 MG/ML IV SOLN
INTRAVENOUS | Status: DC | PRN
  Administered 2014-10-25: 30 mg via INTRAVENOUS

## 2014-10-25 MED ORDER — MIDAZOLAM HCL 2 MG/2ML IJ SOLN
INTRAMUSCULAR | Status: DC | PRN
Start: 2014-10-25 — End: 2014-10-25
  Administered 2014-10-25: 1 mg via INTRAVENOUS

## 2014-10-25 MED ORDER — SODIUM CHLORIDE 0.9 % IV SOLN
INTRAVENOUS | Status: DC
  Administered 2014-10-25: 1000 mL via INTRAVENOUS

## 2014-10-25 MED ORDER — PROPOFOL 10 MG/ML IV BOLUS
INTRAVENOUS | Status: DC | PRN
  Administered 2014-10-25: 50 mg via INTRAVENOUS

## 2014-10-25 NOTE — Transfer of Care (Signed)
Immediate Anesthesia Transfer of Care Note  Patient: Carolyn Woods  Procedure(s) Performed: Procedure(s): ESOPHAGOGASTRODUODENOSCOPY (EGD) WITH PROPOFOL (N/A)  Patient Location: PACU and Endoscopy Unit  Anesthesia Type:General  Level of Consciousness: sedated  Airway & Oxygen Therapy: Patient Spontanous Breathing and Patient connected to nasal cannula oxygen  Post-op Assessment: Report given to RN and Post -op Vital signs reviewed and stable  Post vital signs: Reviewed and stable  Last Vitals:  Filed Vitals:   10/25/14 1619  BP: 103/59  Pulse:   Temp: 36.2 C  Resp: 22    Complications: No apparent anesthesia complications

## 2014-10-25 NOTE — Anesthesia Postprocedure Evaluation (Signed)
  Anesthesia Post-op Note  Patient: Carolyn Woods  Procedure(s) Performed: Procedure(s): ESOPHAGOGASTRODUODENOSCOPY (EGD) WITH PROPOFOL (N/A)  Anesthesia type:General  Patient location: PACU  Post pain: Pain level controlled  Post assessment: Post-op Vital signs reviewed, Patient's Cardiovascular Status Stable, Respiratory Function Stable, Patent Airway and No signs of Nausea or vomiting  Post vital signs: Reviewed and stable  Last Vitals:  Filed Vitals:   10/25/14 1649  BP: 136/66  Pulse: 54  Temp:   Resp: 22    Level of consciousness: awake, alert  and patient cooperative  Complications: No apparent anesthesia complications

## 2014-10-25 NOTE — Anesthesia Preprocedure Evaluation (Addendum)
Anesthesia Evaluation  Patient identified by MRN, date of birth, ID band Patient awake    Reviewed: Allergy & Precautions, H&P , NPO status , Patient's Chart, lab work & pertinent test results, reviewed documented beta blocker date and time   History of Anesthesia Complications Negative for: history of anesthetic complications  Airway Mallampati: III  TM Distance: >3 FB Neck ROM: full    Dental no notable dental hx. (+) Chipped Permanent bridge on the top in the middle:   Pulmonary neg pulmonary ROS,    Pulmonary exam normal breath sounds clear to auscultation       Cardiovascular Exercise Tolerance: Good hypertension, On Medications and On Home Beta Blockers (-) angina(-) CAD, (-) Past MI, (-) Cardiac Stents and (-) CABG negative cardio ROS Normal cardiovascular exam(-) dysrhythmias (-) Valvular Problems/Murmurs Rhythm:regular Rate:Normal     Neuro/Psych negative neurological ROS  negative psych ROS   GI/Hepatic Neg liver ROS, GERD  Medicated and Controlled,  Endo/Other  negative endocrine ROS  Renal/GU negative Renal ROS  negative genitourinary   Musculoskeletal   Abdominal   Peds  Hematology negative hematology ROS (+)   Anesthesia Other Findings Past Medical History:   Hypertension                                                 Anemia                                                       Arthritis                                                    Hyperlipidemia                                               Duodenal adenoma                                             Diverticulosis                                               Reproductive/Obstetrics negative OB ROS                            Anesthesia Physical Anesthesia Plan  ASA: II  Anesthesia Plan: General   Post-op Pain Management:    Induction:   Airway Management Planned:   Additional Equipment:   Intra-op  Plan:   Post-operative Plan:   Informed Consent: I have reviewed the patients History and Physical, chart, labs and discussed the procedure including the risks, benefits and alternatives for the proposed anesthesia with the patient  or authorized representative who has indicated his/her understanding and acceptance.   Dental Advisory Given  Plan Discussed with: Anesthesiologist, CRNA and Surgeon  Anesthesia Plan Comments:         Anesthesia Quick Evaluation

## 2014-10-25 NOTE — Op Note (Signed)
[facility] Gastroenterology Patient Name: Carolyn Woods Procedure Date: 10/25/2014 3:44 PM MRN: [id number] Account #: [id number] Date of Birth: 1940/10/27 Admit Type: Outpatient Age: 74 Room: [facility] ENDO ROOM 3 Gender: Female Note Status: Finalized Procedure:         Upper GI endoscopy Indications:       Surveillance procedure, Follow-up of polyps in the duodenum Providers:         Lollie Sails, MD Referring MD:      Irven Easterly. Kary Kos, MD (Referring MD) Medicines:         Monitored Anesthesia Care Complications:     No immediate complications. There is a submucosal                     ecchymosis noted at about 16 cm from the alveolar ridge                     about the level of the ues with some mucosal scuffing but                     no break in the lining or other defect noted. Procedure:         Pre-Anesthesia Assessment:                    - ASA Grade Assessment: III - A patient with severe                     systemic disease.                    After obtaining informed consent, the endoscope was passed                     under direct vision. Throughout the procedure, the                     patient's blood pressure, pulse, and oxygen saturations                     were monitored continuously. The Endoscope was introduced                     through the mouth, and advanced to the third part of                     duodenum. The upper GI endoscopy was performed with                     moderate difficulty due to a J-shaped stomach which made                     pyloric intubation difficult. The patient tolerated the                     procedure well. Findings:      The Z-line was variable. Biopsies were taken with a cold forceps for       histology.      Localized mild inflammation characterized by congestion (edema) and       erythema was found in the prepyloric region of the stomach. Biopsies       were taken with a cold forceps for histology.  Biopsies were taken with a       cold forceps for Helicobacter pylori testing.  The scope was taken to the 3rd portion of the duodenum. Tattoo material       is noted in the second portion. There was no evidence of recurrent poltp       noted. No other lesions were noted. The c-loop is angulated and       difficult to access. Impression:        - Z-line variable. Biopsied.                    - Gastritis. Biopsied. Recommendation:    - Discharge patient to home.                    - Continue present medications.                    - Repeat the upper endoscopy in 1 year. Procedure Code(s): --- Professional ---                    [phone], Esophagogastroduodenoscopy, flexible, transoral;                     with biopsy, single or multiple Diagnosis Code(s): --- Professional ---                    530.89, Other specified disorders of esophagus                    535.50, Unspecified gastritis and gastroduodenitis,                     without mention of hemorrhage                    211.2, Benign neoplasm of duodenum, jejunum, and ileum CPT copyright 2014 [employer]. All rights reserved. The codes documented in this report are preliminary and upon coder review may  be revised to meet current compliance requirements. Lollie Sails, MD 10/25/2014 4:22:42 PM This report has been signed electronically. Number of Addenda: 0 Note Initiated On: 10/25/2014 3:44 PM      [facility]

## 2014-10-25 NOTE — H&P (Signed)
Outpatient short stay form Pre-procedure 10/25/2014 3:37 PM Carolyn Sails MD  Primary Physician: Dr. [place] Pink  Reason for visit:  EGD  History of present illness:  Carolyn Woods is a 74 year old female with a history of chronic recurrence of a duodenal adenoma. She ridging had surgery number of years ago with a social over the polyp recurred. She has been seen [facility] with ERCP and APC being done for eradication of the lesion. He is returning today for a follow-up evaluation. She takes a 81 mg aspirin that has been held. Takes no other anticoagulation medications.    Current facility-administered medications:  .  0.9 %  sodium chloride infusion, , Intravenous, Continuous, Carolyn Sails, MD, Last Rate: 20 mL/hr at 10/25/14 1423, 1,000 mL at 10/25/14 1423 .  0.9 %  sodium chloride infusion, , Intravenous, Continuous, Carolyn Sails, MD  Prescriptions prior to admission  Medication Sig Dispense Refill Last Dose  . amLODipine (NORVASC) 2.5 MG tablet Take 2.5 mg by mouth daily.   10/25/2014 at 1100amUnknown time  . aspirin EC 81 MG tablet Take 81 mg by mouth daily.   Past Week at Unknown time  . atenolol (TENORMIN) 25 MG tablet Take by mouth daily.   10/25/2014 at 1100amUnknown time  . conjugated estrogens (PREMARIN) vaginal cream Place 1 Applicatorful vaginally 2 (two) times a week.   Past Week at Unknown time  . ergocalciferol (VITAMIN D2) 50000 UNITS capsule Take 50,000 Units by mouth once a week.   Past Week at Unknown time  . lubiprostone (AMITIZA) 24 MCG capsule Take 24 mcg by mouth 2 (two) times daily with a meal.   10/24/2014 at 1100am  . naproxen sodium (ANAPROX) 220 MG tablet Take 220 mg by mouth 2 (two) times daily as needed.   10/24/2014 at 1100  . pantoprazole (PROTONIX) 40 MG tablet Take 40 mg by mouth 2 (two) times daily.   10/24/2014 at 1100amUnknown time  . simvastatin (ZOCOR) 20 MG tablet Take 20 mg by mouth daily.   10/24/2014 at 11ooam  . meloxicam (MOBIC) 15 MG tablet Take  15 mg by mouth daily.   Completed Course at Unknown time     Allergies  Allergen Reactions  . Codeine Rash     Past Medical History  Diagnosis Date  . Hypertension   . Anemia   . Arthritis   . Hyperlipidemia   . Duodenal adenoma   . Diverticulosis     Review of systems:      Physical Exam    Heart and lungs: Regular rate and rhythm without rub or gallop lungs are bilaterally clear    HEENT: Normocephalic atraumatic eyes are anicteric    Other:     Pertinant exam for procedure: Soft nontender nondistended bowel sounds positive normoactive    Planned proceedures: EGD and indicated procedures I have discussed the risks benefits and complications of procedures to include not limited to bleeding, infection, perforation and the risk of sedation and the Carolyn Woods wishes to proceed.    Carolyn Sails, MD Gastroenterology 10/25/2014  3:37 PM

## 2014-10-25 NOTE — Anesthesia Procedure Notes (Signed)
Date/Time: 10/25/2014 3:50 PM Performed by: Doreen Salvage Pre-anesthesia Checklist: Patient identified, Emergency Drugs available, Suction available and Patient being monitored Patient Re-evaluated:Patient Re-evaluated prior to inductionOxygen Delivery Method: Nasal cannula Intubation Type: IV induction Dental Injury: Teeth and Oropharynx as per pre-operative assessment  Comments: Nasal cannula with etCO2 monitoring

## 2014-10-27 LAB — SURGICAL PATHOLOGY

## 2014-10-31 ENCOUNTER — Encounter: Payer: Self-pay | Admitting: Gastroenterology

## 2015-03-27 ENCOUNTER — Other Ambulatory Visit: Payer: Self-pay | Admitting: Family Medicine

## 2015-03-27 DIAGNOSIS — Z1231 Encounter for screening mammogram for malignant neoplasm of breast: Secondary | ICD-10-CM

## 2015-04-05 ENCOUNTER — Ambulatory Visit
Admission: RE | Admit: 2015-04-05 | Discharge: 2015-04-05 | Disposition: A | Discharge status: Home / Self Care | Payer: Medicare Other | Source: Ambulatory Visit | Attending: Family Medicine | Admitting: Family Medicine

## 2015-04-05 ENCOUNTER — Other Ambulatory Visit: Payer: Self-pay | Admitting: Family Medicine

## 2015-04-05 DIAGNOSIS — Z1231 Encounter for screening mammogram for malignant neoplasm of breast: Secondary | ICD-10-CM | POA: Diagnosis present

## 2015-04-05 IMAGING — MG MM SCREENING BREAST TOMO BILATERAL
8 of 15 series · 8 of 35 positions shown · non-contrast
Comparison: Previous exam(s).

CLINICAL DATA: Screening.

EXAM:
DIGITAL SCREENING BILATERAL MAMMOGRAM WITH 3D TOMO WITH CAD

[L MLO]
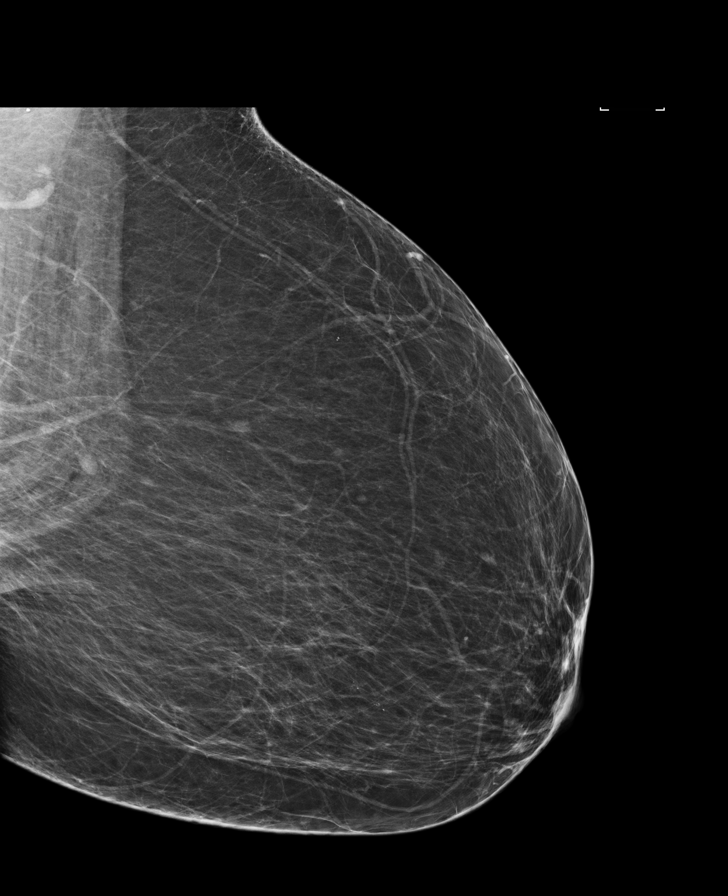

[L CC synth-2D (1 of 2)]
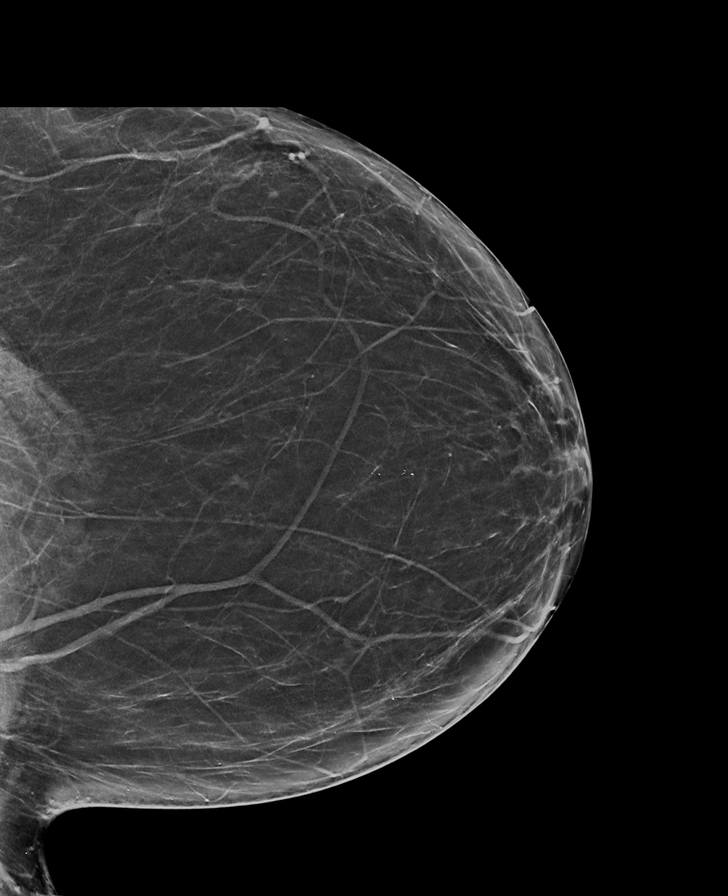

[R CC synth-2D]
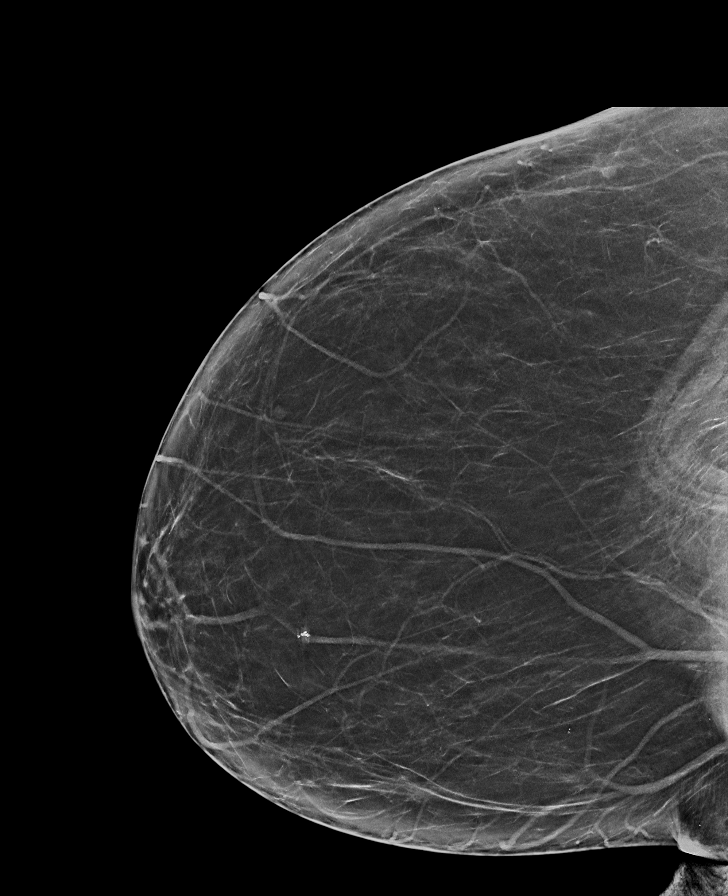

[R MLO]
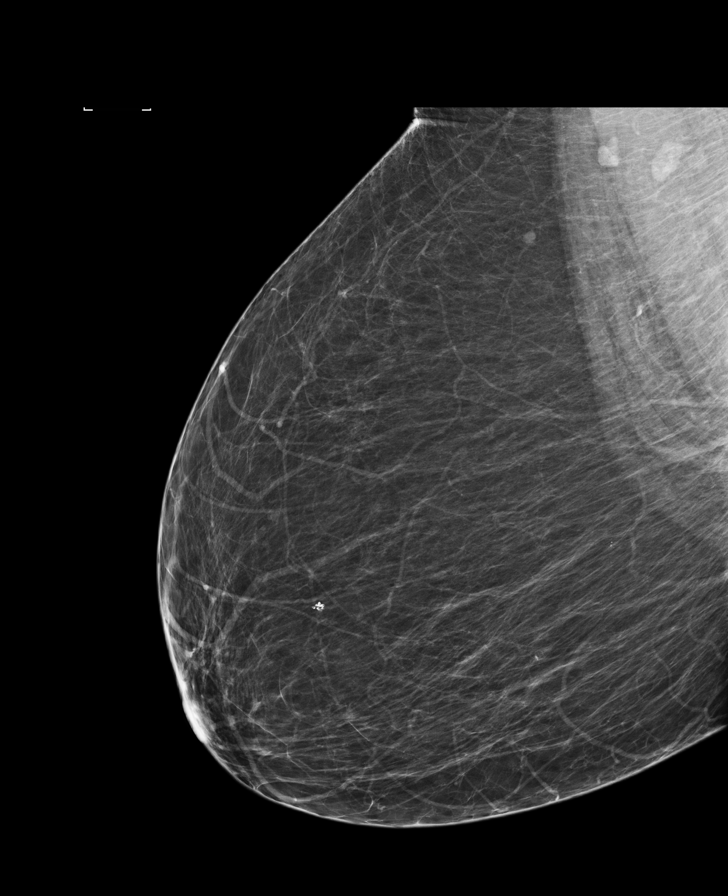

[L CC synth-2D (2 of 2)]
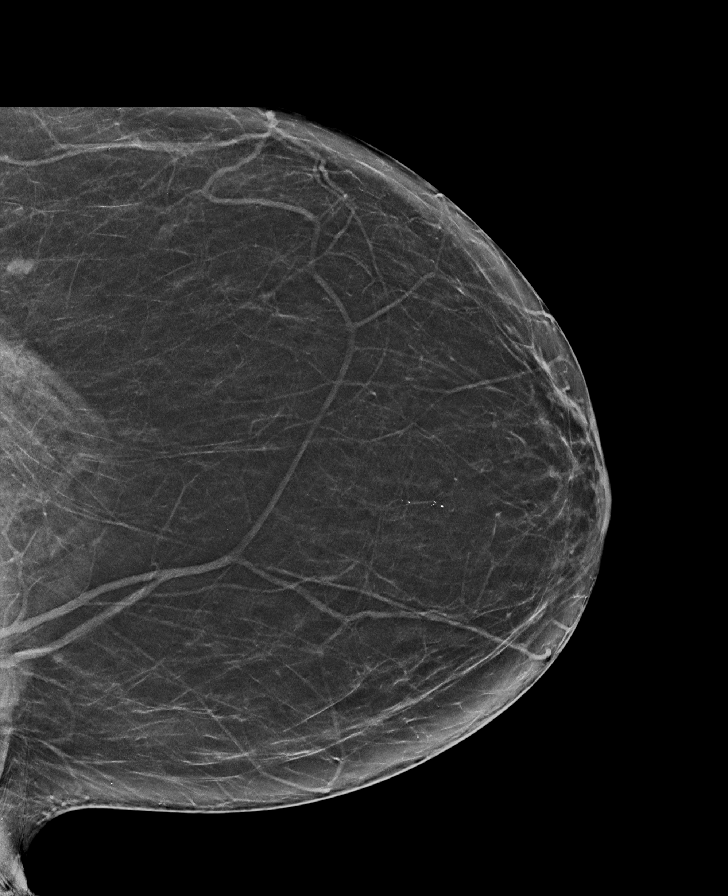

[L CC]
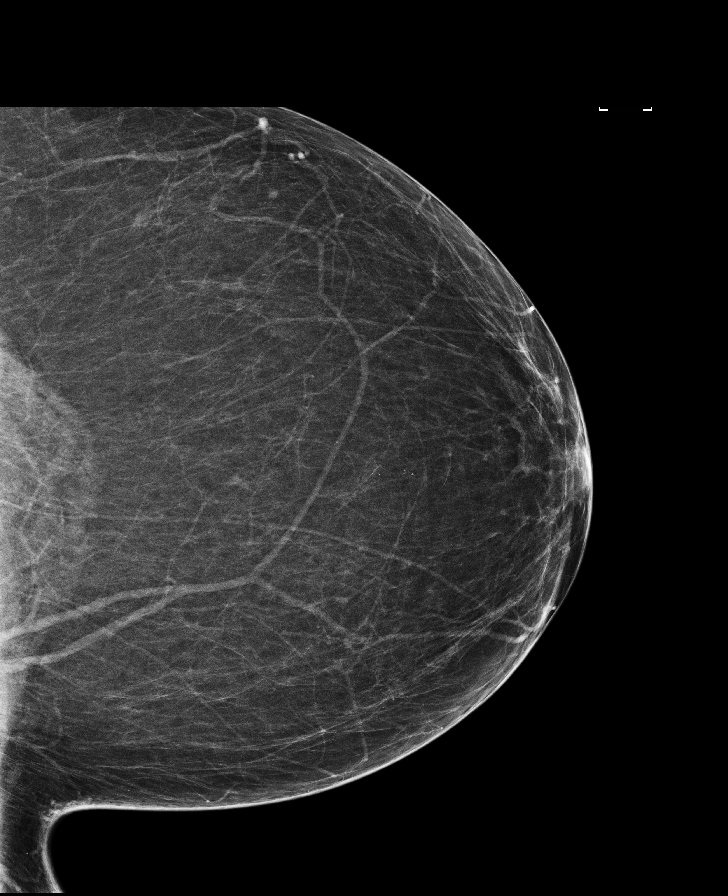

[R CC]
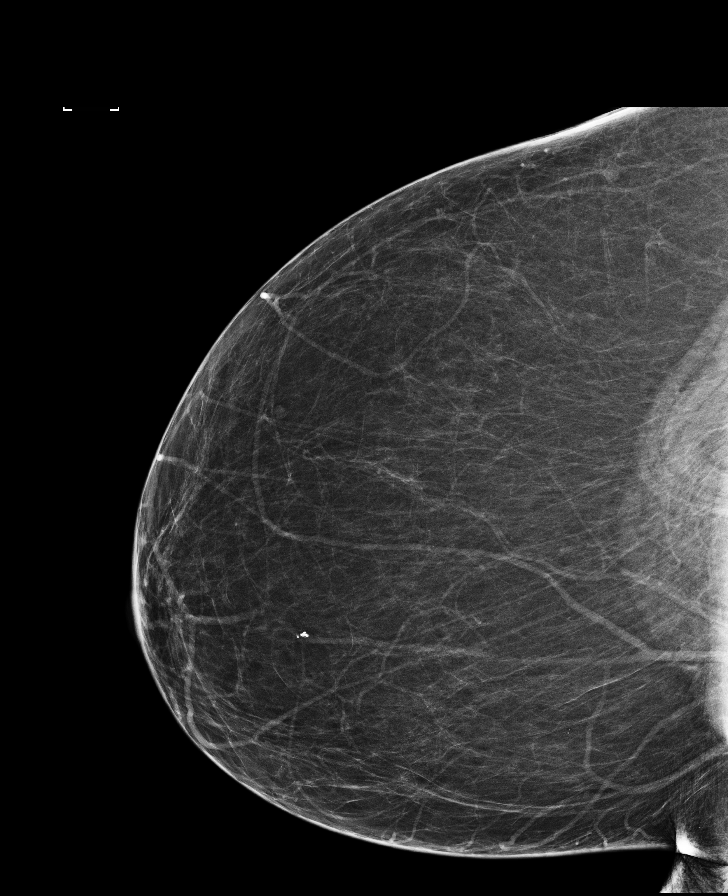

[R MLO synth-2D]
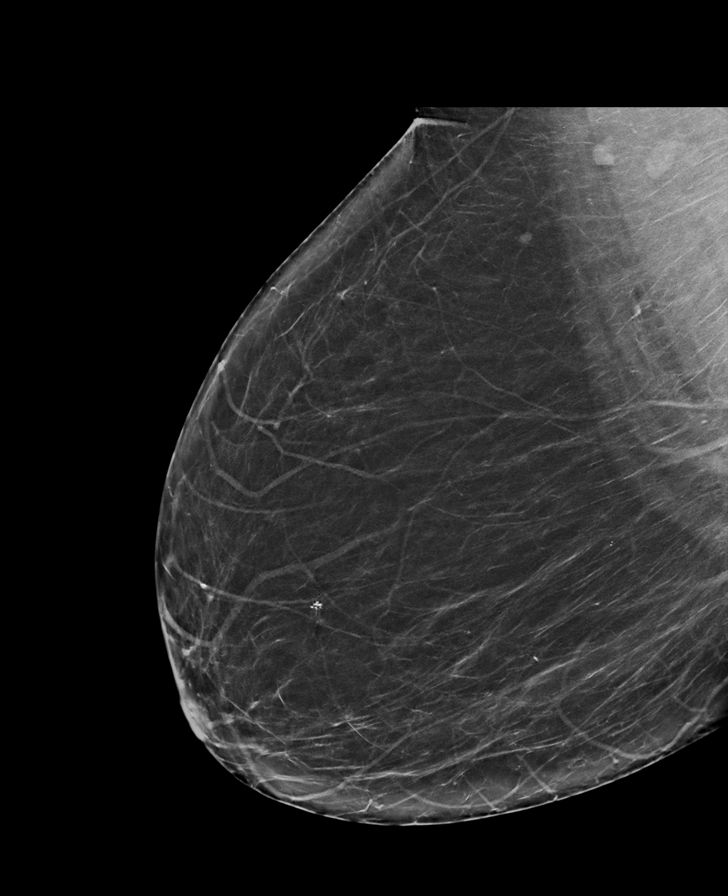

[8 of 35 positions shown; findings below may reference images not displayed]

ACR Breast Density Category b: There are scattered areas of
fibroglandular density.
FINDINGS: There are no findings suspicious for malignancy. Images were
processed with CAD.
IMPRESSION: No mammographic evidence of malignancy. A result letter of this
screening mammogram will be mailed directly to the patient.

RECOMMENDATION:
Screening mammogram in one year. (Code:[F0])

BI-RADS CATEGORY  1: Negative.

## 2015-05-02 ENCOUNTER — Other Ambulatory Visit: Payer: Self-pay | Admitting: Gastroenterology

## 2015-05-02 DIAGNOSIS — R1013 Epigastric pain: Secondary | ICD-10-CM

## 2015-05-10 ENCOUNTER — Ambulatory Visit: Payer: Medicare Other

## 2015-05-12 ENCOUNTER — Ambulatory Visit
Admission: RE | Admit: 2015-05-12 | Discharge: 2015-05-12 | Disposition: A | Discharge status: Home / Self Care | Payer: Medicare Other | Source: Ambulatory Visit | Attending: Gastroenterology | Admitting: Gastroenterology

## 2015-05-12 DIAGNOSIS — K219 Gastro-esophageal reflux disease without esophagitis: Secondary | ICD-10-CM | POA: Insufficient documentation

## 2015-05-12 DIAGNOSIS — R1013 Epigastric pain: Secondary | ICD-10-CM | POA: Insufficient documentation

## 2015-05-12 DIAGNOSIS — K449 Diaphragmatic hernia without obstruction or gangrene: Secondary | ICD-10-CM | POA: Insufficient documentation

## 2015-05-12 IMAGING — RF DG UGI W/O KUB
14 of 16 series · 14 of 16 positions shown · non-contrast
Comparison: None in PACs

CLINICAL DATA: Several month history of epigastric pain ; history
of ulcers and gastroesophageal reflux; Previous endoscopy in [CE]

EXAM:
UPPER GI SERIES WITHOUT KUB
TECHNIQUE: Routine upper GI series was performed with thin/high density/water
soluble barium. Effervescent crystals and a barium tablet were
administered.
FLUOROSCOPY TIME:  Fluoroscopy Time (in minutes and seconds): 1
minutes, 18 seconds.
Number of Acquired Images:  16

[Series 1: fluoro_barium singleshot_bw · 0.17mm/px · 1 of 1 slices shown (1 of 13)]
[im 1/1]
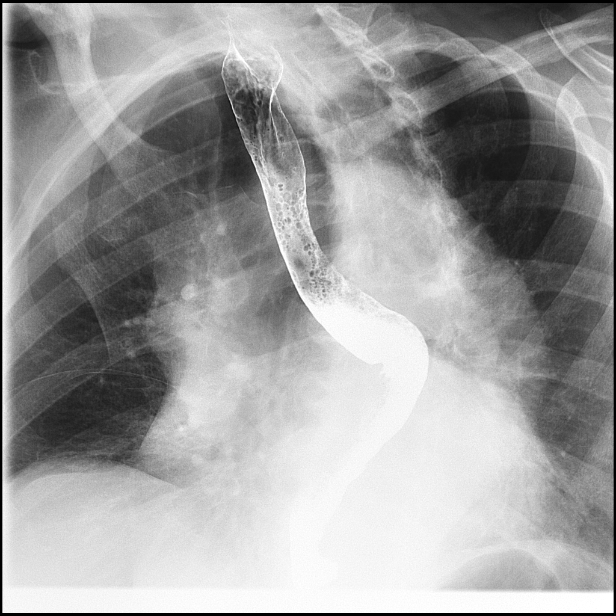

[Series 2: fluoro_barium singleshot_bw · 0.17mm/px · 1 of 1 slices shown (2 of 13)]
[im 1/1]
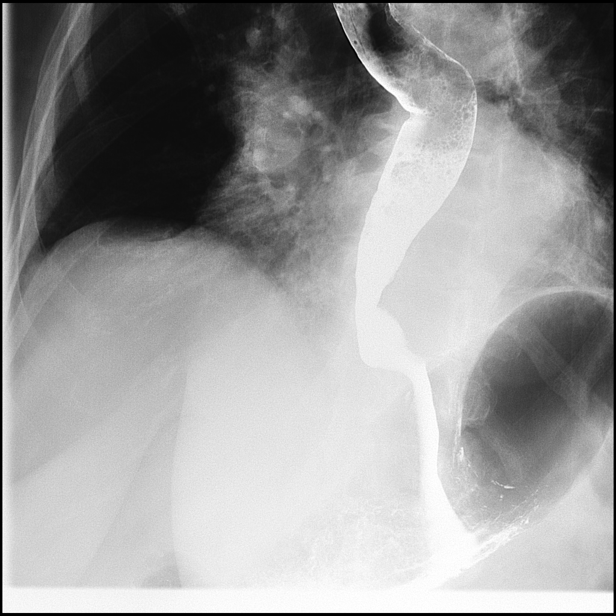

[Series 3: fluoro_barium singleshot_bw · 0.17mm/px · 1 of 1 slices shown (3 of 13)]
[im 1/1]
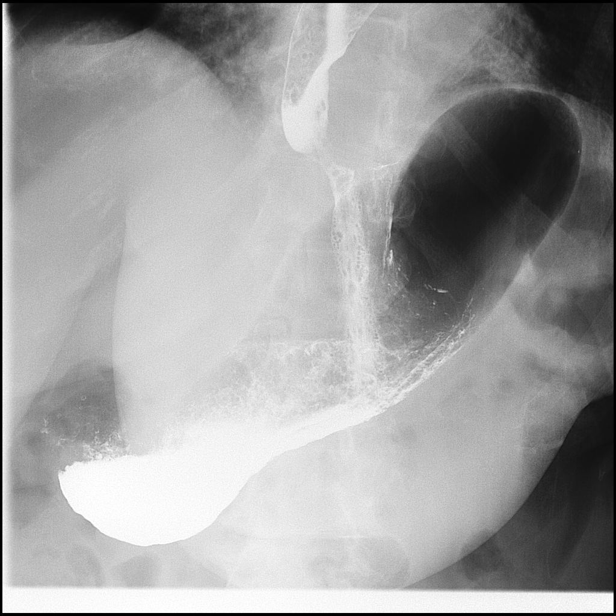

[Series 5: fluoro_barium singleshot_bw · 0.18mm/px · 1 of 1 slices shown (4 of 13)]
[im 1/1]
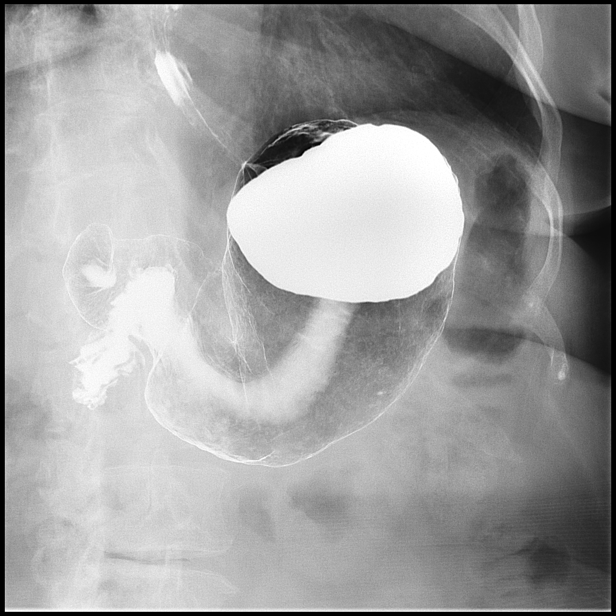

[Series 6: fluoro_barium singleshot_bw · 0.18mm/px · 1 of 1 slices shown (5 of 13)]
[im 1/1]
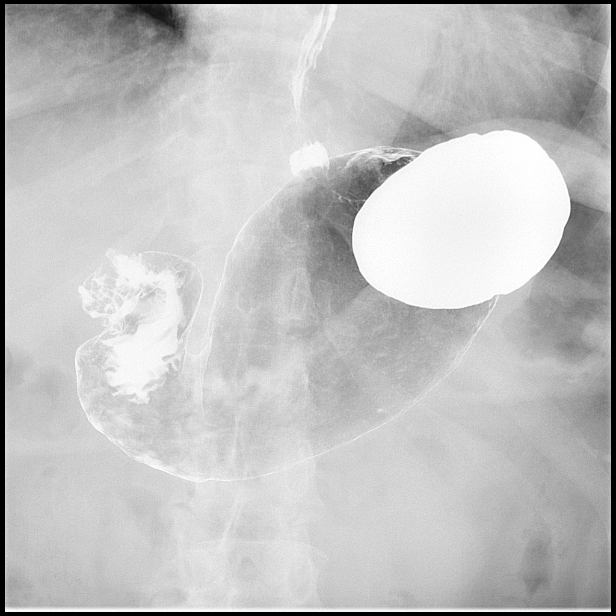

[Series 7: fluoro_barium singleshot_bw · 0.18mm/px · 1 of 1 slices shown (6 of 13)]
[im 1/1]
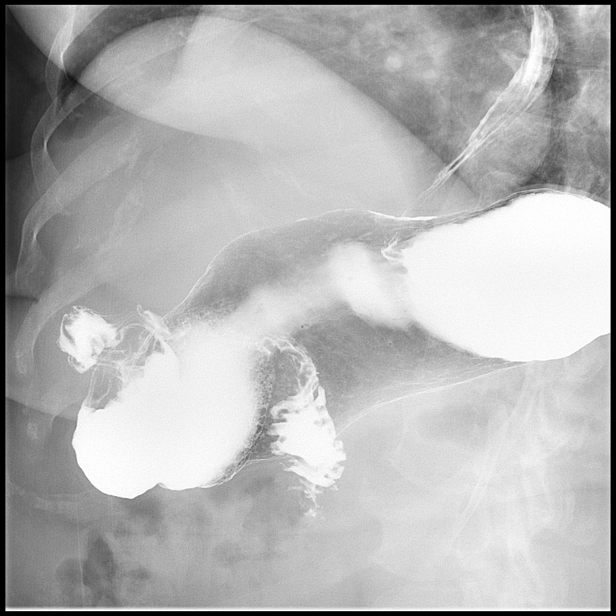

[Series 8: fluoro_barium singleshot_bw · 0.18mm/px · 1 of 1 slices shown (7 of 13)]
[im 1/1]
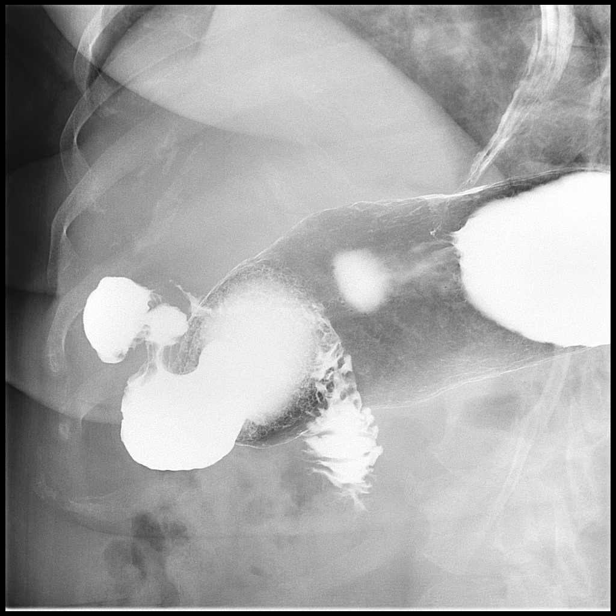

[Series 9: fluoro_barium singleshot_bw · 0.18mm/px · 1 of 1 slices shown (8 of 13)]
[im 1/1]
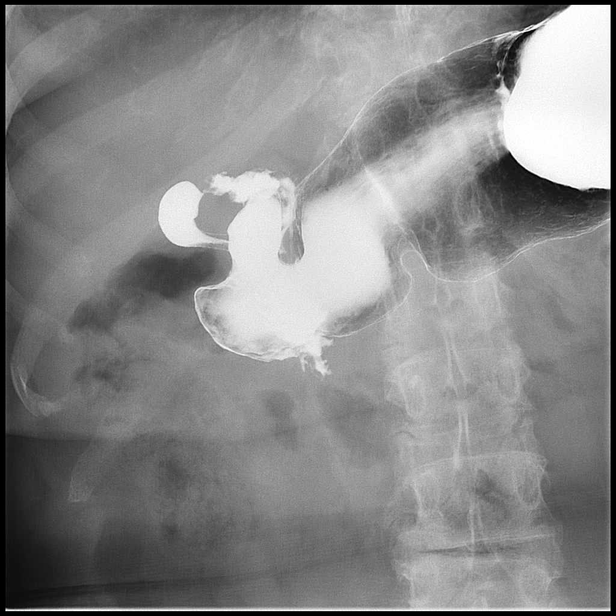

[Series 10: fluoro_barium singleshot_bw · 0.18mm/px · 1 of 1 slices shown (9 of 13)]
[im 1/1]
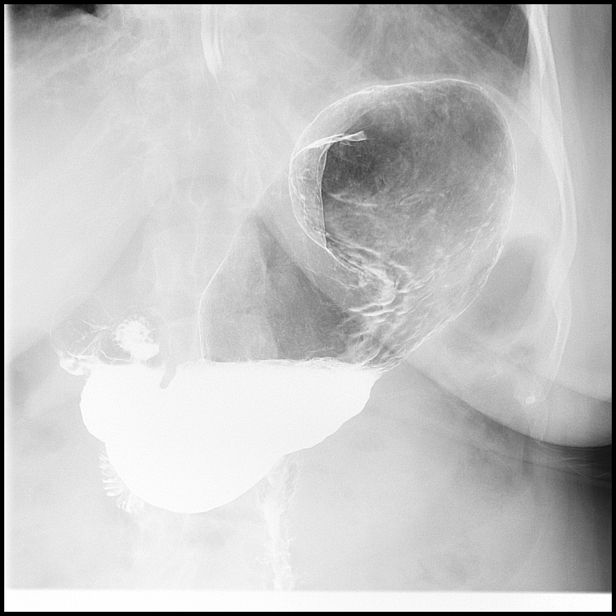

[Series 11: fluoro_barium singleshot_bw · 0.18mm/px · 1 of 1 slices shown (10 of 13)]
[im 1/1]
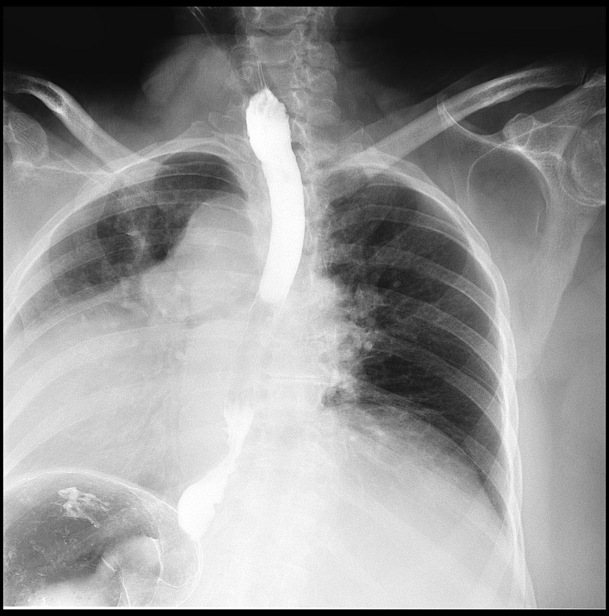

[Series 13: fluoro_barium singleshot_bw · 0.18mm/px · 1 of 1 slices shown (11 of 13)]
[im 1/1]
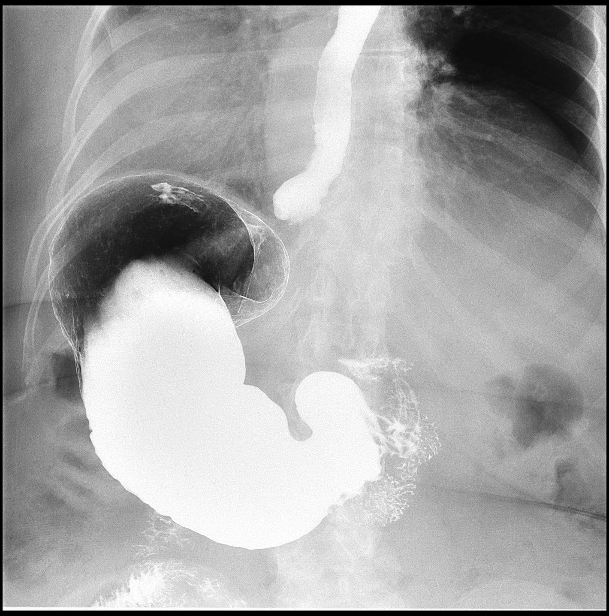

[Series 14: fluoro_barium singleshot_bw · 0.18mm/px · 1 of 1 slices shown (12 of 13)]
[im 1/1]
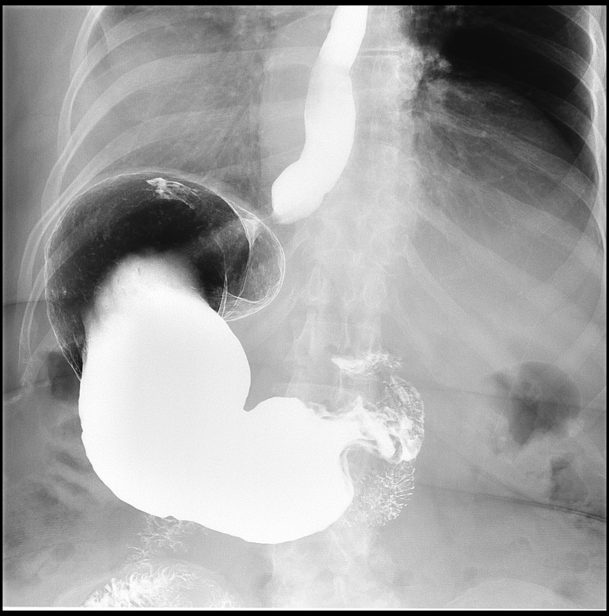

[Series 15: cp_standard · 0.27mm/px · 1 of 1 slices shown]
[im 1/1]
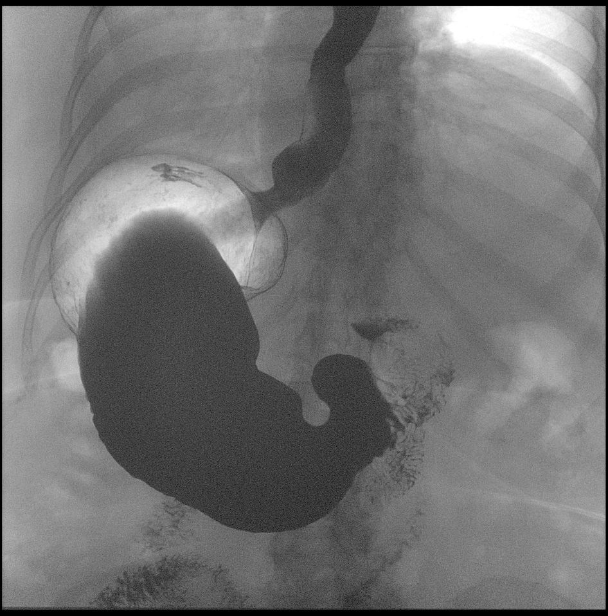

[Series 16: fluoro_barium singleshot_bw · 0.18mm/px · 1 of 1 slices shown (13 of 13)]
[im 1/1]
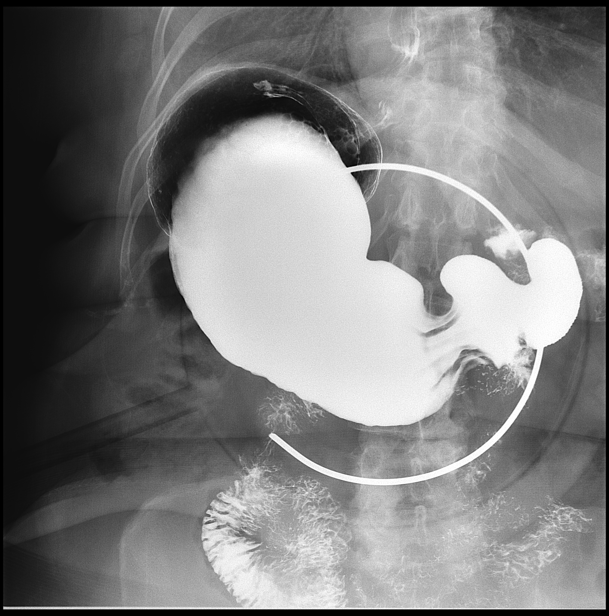

[14 of 16 positions shown; findings below may reference images not displayed]

FINDINGS: The thoracic esophagus is somewhat tortuous in its course but it
distends well. There is no evidence of ulceration. A small reducible
hiatal hernia was observed. A small amount of gastroesophageal
reflux occurred spontaneously. The barium tablet passed promptly
from the mouth to the stomach.

The stomach distended well. The mucosal fold pattern was normal.
Gastric emptying was prompt. The duodenal bulb and Celsius sweep
were normal in appearance. No gastric or duodenal ulcers were
observed.
IMPRESSION: 1. Small incompletely reducible hiatal hernia with a small amount of
spontaneous gastroesophageal reflux. There is no evidence of
stricture nor esophagitis.
2. Normal appearance of the stomach and duodenum.

## 2015-12-04 ENCOUNTER — Encounter: Payer: Self-pay | Admitting: *Deleted

## 2015-12-05 ENCOUNTER — Encounter
Admission: RE | Disposition: A | Discharge status: Home / Self Care | Payer: Self-pay | Source: Ambulatory Visit | Attending: Gastroenterology

## 2015-12-05 ENCOUNTER — Ambulatory Visit
Admission: RE | Admit: 2015-12-05 | Discharge: 2015-12-05 | Disposition: A | Discharge status: Home / Self Care | Payer: Medicare Other | Source: Ambulatory Visit | Attending: Gastroenterology | Admitting: Gastroenterology

## 2015-12-05 ENCOUNTER — Ambulatory Visit: Payer: Medicare Other | Admitting: Anesthesiology

## 2015-12-05 ENCOUNTER — Encounter: Payer: Self-pay | Admitting: Anesthesiology

## 2015-12-05 DIAGNOSIS — Z8719 Personal history of other diseases of the digestive system: Secondary | ICD-10-CM | POA: Diagnosis present

## 2015-12-05 DIAGNOSIS — D132 Benign neoplasm of duodenum: Secondary | ICD-10-CM | POA: Insufficient documentation

## 2015-12-05 DIAGNOSIS — Z7982 Long term (current) use of aspirin: Secondary | ICD-10-CM | POA: Insufficient documentation

## 2015-12-05 DIAGNOSIS — M1990 Unspecified osteoarthritis, unspecified site: Secondary | ICD-10-CM | POA: Diagnosis not present

## 2015-12-05 DIAGNOSIS — Z791 Long term (current) use of non-steroidal anti-inflammatories (NSAID): Secondary | ICD-10-CM | POA: Diagnosis not present

## 2015-12-05 DIAGNOSIS — K2270 Barrett's esophagus without dysplasia: Secondary | ICD-10-CM | POA: Insufficient documentation

## 2015-12-05 DIAGNOSIS — E785 Hyperlipidemia, unspecified: Secondary | ICD-10-CM | POA: Diagnosis not present

## 2015-12-05 DIAGNOSIS — Z885 Allergy status to narcotic agent status: Secondary | ICD-10-CM | POA: Insufficient documentation

## 2015-12-05 DIAGNOSIS — Z86018 Personal history of other benign neoplasm: Secondary | ICD-10-CM | POA: Insufficient documentation

## 2015-12-05 DIAGNOSIS — Z79899 Other long term (current) drug therapy: Secondary | ICD-10-CM | POA: Insufficient documentation

## 2015-12-05 DIAGNOSIS — K2950 Unspecified chronic gastritis without bleeding: Secondary | ICD-10-CM | POA: Insufficient documentation

## 2015-12-05 DIAGNOSIS — I10 Essential (primary) hypertension: Secondary | ICD-10-CM | POA: Insufficient documentation

## 2015-12-05 DIAGNOSIS — Z79890 Hormone replacement therapy (postmenopausal): Secondary | ICD-10-CM | POA: Diagnosis not present

## 2015-12-05 HISTORY — DX: Gastritis, unspecified, without bleeding: K29.70

## 2015-12-05 HISTORY — DX: Stress incontinence (female) (male): N39.3

## 2015-12-05 HISTORY — DX: Benign neoplasm, unspecified site: D36.9

## 2015-12-05 HISTORY — DX: Candidiasis of vulva and vagina: B37.3

## 2015-12-05 HISTORY — PX: ESOPHAGOGASTRODUODENOSCOPY (EGD) WITH PROPOFOL: SHX5813

## 2015-12-05 HISTORY — DX: Cystitis, unspecified without hematuria: N30.90

## 2015-12-05 HISTORY — DX: Sciatica, unspecified side: M54.30

## 2015-12-05 HISTORY — DX: Overflow incontinence: N39.490

## 2015-12-05 HISTORY — DX: Acute candidiasis of vulva and vagina: B37.31

## 2015-12-05 SURGERY — ESOPHAGOGASTRODUODENOSCOPY (EGD) WITH PROPOFOL
Anesthesia: General

## 2015-12-05 MED ORDER — SODIUM CHLORIDE 0.9 % IV SOLN
INTRAVENOUS | Status: DC
  Administered 2015-12-05: 09:00:00 via INTRAVENOUS

## 2015-12-05 MED ORDER — SODIUM CHLORIDE 0.9 % IV SOLN: INTRAVENOUS | Status: DC

## 2015-12-05 MED ORDER — FENTANYL CITRATE (PF) 100 MCG/2ML IJ SOLN
INTRAMUSCULAR | Status: DC | PRN
  Administered 2015-12-05: 50 ug via INTRAVENOUS

## 2015-12-05 MED ORDER — MIDAZOLAM HCL 2 MG/2ML IJ SOLN
INTRAMUSCULAR | Status: DC | PRN
  Administered 2015-12-05: 1 mg via INTRAVENOUS

## 2015-12-05 MED ORDER — LIDOCAINE HCL (CARDIAC) 20 MG/ML IV SOLN
INTRAVENOUS | Status: DC | PRN
  Administered 2015-12-05: 30 mg via INTRAVENOUS

## 2015-12-05 MED ORDER — PROPOFOL 500 MG/50ML IV EMUL
INTRAVENOUS | Status: DC | PRN
  Administered 2015-12-05: 120 ug/kg/min via INTRAVENOUS

## 2015-12-05 NOTE — Anesthesia Preprocedure Evaluation (Signed)
Anesthesia Evaluation  Patient identified by MRN, date of birth, ID band Patient awake    Reviewed: Allergy & Precautions, NPO status , Patient's Chart, lab work & pertinent test results  Airway Mallampati: III       Dental  (+) Caps   Pulmonary neg pulmonary ROS,    breath sounds clear to auscultation       Cardiovascular hypertension, Pt. on home beta blockers  Rhythm:Regular     Neuro/Psych negative neurological ROS     GI/Hepatic negative GI ROS, Neg liver ROS,   Endo/Other  negative endocrine ROS  Renal/GU negative Renal ROS     Musculoskeletal   Abdominal   Peds  Hematology  (+) anemia ,   Anesthesia Other Findings   Reproductive/Obstetrics                             Anesthesia Physical Anesthesia Plan  ASA: II  Anesthesia Plan: General   Post-op Pain Management:    Induction: Intravenous  Airway Management Planned: Natural Airway and Nasal Cannula  Additional Equipment:   Intra-op Plan:   Post-operative Plan:   Informed Consent: I have reviewed the patients History and Physical, chart, labs and discussed the procedure including the risks, benefits and alternatives for the proposed anesthesia with the patient or authorized representative who has indicated his/her understanding and acceptance.     Plan Discussed with: CRNA  Anesthesia Plan Comments:         Anesthesia Quick Evaluation

## 2015-12-05 NOTE — Anesthesia Procedure Notes (Signed)
Performed by: COOK-MARTIN, Jayliani Wanner Pre-anesthesia Checklist: Patient identified, Emergency Drugs available, Suction available, Patient being monitored and Timeout performed Patient Re-evaluated:Patient Re-evaluated prior to inductionOxygen Delivery Method: Nasal cannula Preoxygenation: Pre-oxygenation with 100% oxygen Intubation Type: IV induction Airway Equipment and Method: Bite block Placement Confirmation: positive ETCO2 and CO2 detector     

## 2015-12-05 NOTE — Anesthesia Postprocedure Evaluation (Signed)
Anesthesia Post Note  Patient: Carolyn Woods  Procedure(s) Performed: Procedure(s) (LRB): ESOPHAGOGASTRODUODENOSCOPY (EGD) WITH PROPOFOL (N/A)  Patient location during evaluation: PACU Anesthesia Type: General Level of consciousness: awake Pain management: pain level controlled Vital Signs Assessment: post-procedure vital signs reviewed and stable Respiratory status: nonlabored ventilation Cardiovascular status: stable Anesthetic complications: no    Last Vitals:  Vitals:   12/05/15 0950 12/05/15 1000  BP: 108/75 117/73  Pulse:    Resp:    Temp:      Last Pain:  Vitals:   12/05/15 0920  TempSrc: Tympanic                 VAN STAVEREN,Zurie Platas

## 2015-12-05 NOTE — Op Note (Signed)
[facility] Gastroenterology Patient Name: Carolyn Woods Procedure Date: 12/05/2015 8:54 AM MRN: [id number] Account #: [id number] Date of Birth: 06/14/1940 Admit Type: Outpatient Age: 75 Room: [facility] ENDO ROOM 3 Gender: Female Note Status: Finalized Procedure:            Upper GI endoscopy Indications:          Surveillance procedure, Follow-up of endoscopic mucosal                        resection of Barrett's esophagus Providers:            Lollie Sails, MD Referring MD:         Irven Easterly. Kary Kos, MD (Referring MD) Medicines:            Monitored Anesthesia Care Complications:        No immediate complications. Procedure:            Pre-Anesthesia Assessment:                       - ASA Grade Assessment: III - A patient with severe                        systemic disease.                       After obtaining informed consent, the endoscope was                        passed under direct vision. Throughout the procedure,                        the patient's blood pressure, pulse, and oxygen                        saturations were monitored continuously. The Endoscope                        was introduced through the mouth, and advanced to the                        third part of duodenum. The upper GI endoscopy was                        accomplished without difficulty. Findings:      Patchy moderate inflammation characterized by congestion (edema),       erosions and erythema was found at the incisura, in the gastric antrum       and in the prepyloric region of the stomach. Biopsies were taken with a       cold forceps for Helicobacter pylori testing.      The exam of the stomach was otherwise normal.      The area of previous tattoo was easily found. The opening of the bile       duct seen. There is a small area of variant mucosa noted with mild       texture change, this biopsies/removed with a cold forcep. No other       irregularity is noted in the  area.      The cardia and gastric fundus were normal on retroflexion.      The Z-line was variable. Biopsies were taken  with a cold forceps for       histology. A small nodule was also noted at the GE junction, this       removed with cold forcep. Impression:           - Erosive gastritis. Biopsied.                       - Z-line variable. Biopsied. Recommendation:       - Discharge patient to home. Procedure Code(s):    --- Professional ---                       [phone], Esophagogastroduodenoscopy, flexible, transoral;                        with biopsy, single or multiple Diagnosis Code(s):    --- Professional ---                       K29.60, Other gastritis without bleeding                       K22.8, Other specified diseases of esophagus                       K22.70, Barrett's esophagus without dysplasia                       Z09, Encounter for follow-up examination after                        completed treatment for conditions other than malignant                        neoplasm CPT copyright 2016 [employer]. All rights reserved. The codes documented in this report are preliminary and upon coder review may  be revised to meet current compliance requirements. Lollie Sails, MD 12/05/2015 9:26:56 AM This report has been signed electronically. Number of Addenda: 0 Note Initiated On: 12/05/2015 8:54 AM      [facility]

## 2015-12-05 NOTE — H&P (Signed)
Outpatient short stay form Pre-procedure 12/05/2015 8:51 AM Lollie Sails MD  Primary Physician: Dr. [place] Pink  Reason for visit:  EGD  History of present illness:  Patient is a 75 year old female presenting today for follow-up in regards to her history of duodenal adenoma. Her last EGD was about a year ago with no evidence of recurrence at that time. 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 Her last EGD was about a year ago with no evidence of recurrence at the site. She does take Protonix 40 mg once a day will occasionally take it twice a day of her dyspepsia increases with good result. She takes no aspirin products with the exception of 81 mg aspirin, however she has been taking occasional meloxicam.    Current Facility-Administered Medications:  .  0.9 %  sodium chloride infusion, , Intravenous, Continuous, Lollie Sails, MD, Last Rate: 20 mL/hr at 12/05/15 0846 .  0.9 %  sodium chloride infusion, , Intravenous, Continuous, Lollie Sails, MD  Prescriptions Prior to Admission  Medication Sig Dispense Refill Last Dose  . amLODipine (NORVASC) 2.5 MG tablet Take 2.5 mg by mouth daily.   12/05/2015 at 0600  . aspirin EC 81 MG tablet Take 81 mg by mouth daily.   Past Week at Unknown time  . atenolol (TENORMIN) 25 MG tablet Take by mouth daily.   12/05/2015 at 0600  . ergocalciferol (VITAMIN D2) 50000 UNITS capsule Take 50,000 Units by mouth once a week.   Past Week at Unknown time  . naproxen sodium (ANAPROX) 220 MG tablet Take 220 mg by mouth 2 (two) times daily as needed.   Past Month at Unknown time  . conjugated estrogens (PREMARIN) vaginal cream Place 1 Applicatorful vaginally 2 (two) times a week.   Past Week at Unknown time  . lubiprostone (AMITIZA) 24 MCG capsule Take 24 mcg by mouth 2 (two) times daily with a meal.   12/03/2015  . meloxicam (MOBIC) 15 MG  tablet Take 15 mg by mouth daily.   Not Taking at Unknown time  . pantoprazole (PROTONIX) 40 MG tablet Take 40 mg by mouth 2 (two) times daily.   12/03/2015  . simvastatin (ZOCOR) 20 MG tablet Take 20 mg by mouth daily.   10/24/2014 at 11ooam     Allergies  Allergen Reactions  . Codeine Rash     Past Medical History:  Diagnosis Date  . Anemia   . Arthritis   . Candidiasis of the genitals, female   . Cystitis   . Diverticulosis   . Duodenal adenoma   . Gastritis   . Hyperlipidemia   . Hypertension   . 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 Sciatica   . Tubular adenoma     Review of systems:      Physical Exam    Heart and lungs: Regular rate and rhythm without rub or gallop, lungs are bilaterally clear.    HEENT: Normocephalic atraumatic eyes are anicteric    Other:     Pertinant exam for procedure: Soft nontender nondistended bowel sounds positive normoactive.    Planned proceedures: EGD and indicated procedures. I have discussed the risks benefits and complications of procedures to include not limited to bleeding, infection, perforation and the risk of sedation and the patient wishes to proceed.    Lollie Sails, MD Gastroenterology 12/05/2015  8:51 AM

## 2015-12-05 NOTE — Anesthesia Procedure Notes (Signed)
Performed by: COOK-MARTIN, Francelia Mclaren Pre-anesthesia Checklist: Patient identified, Emergency Drugs available, Suction available, Patient being monitored and Timeout performed Patient Re-evaluated:Patient Re-evaluated prior to inductionOxygen Delivery Method: Nasal cannula Preoxygenation: Pre-oxygenation with 100% oxygen Intubation Type: IV induction Airway Equipment and Method: Bite block Placement Confirmation: CO2 detector and positive ETCO2     

## 2015-12-05 NOTE — Transfer of Care (Signed)
Immediate Anesthesia Transfer of Care Note  Patient: Carolyn Woods  Procedure(s) Performed: Procedure(s): ESOPHAGOGASTRODUODENOSCOPY (EGD) WITH PROPOFOL (N/A)  Patient Location: PACU  Anesthesia Type:General  Level of Consciousness: awake and sedated  Airway & Oxygen Therapy: Patient Spontanous Breathing and Patient connected to nasal cannula oxygen  Post-op Assessment: Report given to RN and Post -op Vital signs reviewed and stable  Post vital signs: Reviewed and stable  Last Vitals:  Vitals:   12/05/15 0833  BP: 124/68  Pulse: 73  Resp: 18  Temp: 36.3 C    Last Pain:  Vitals:   12/05/15 0833  TempSrc: Tympanic         Complications: No apparent anesthesia complications

## 2015-12-06 ENCOUNTER — Encounter: Payer: Self-pay | Admitting: Gastroenterology

## 2015-12-06 LAB — SURGICAL PATHOLOGY

## 2016-02-29 ENCOUNTER — Other Ambulatory Visit: Payer: Self-pay | Admitting: Family Medicine

## 2016-02-29 DIAGNOSIS — Z1231 Encounter for screening mammogram for malignant neoplasm of breast: Secondary | ICD-10-CM

## 2016-04-08 ENCOUNTER — Ambulatory Visit
Admission: RE | Admit: 2016-04-08 | Discharge: 2016-04-08 | Disposition: A | Discharge status: Home / Self Care | Payer: Medicare Other | Source: Ambulatory Visit | Attending: Family Medicine | Admitting: Family Medicine

## 2016-04-08 DIAGNOSIS — Z1231 Encounter for screening mammogram for malignant neoplasm of breast: Secondary | ICD-10-CM | POA: Insufficient documentation

## 2016-04-08 IMAGING — MG MM DIGITAL SCREENING BILAT W/ TOMO W/ CAD
8 of 12 series · 8 of 28 positions shown · non-contrast
Comparison: Previous exam(s).

CLINICAL DATA: Screening.

EXAM:
2D DIGITAL SCREENING BILATERAL MAMMOGRAM WITH CAD AND ADJUNCT TOMO

[R CC synth-2D]
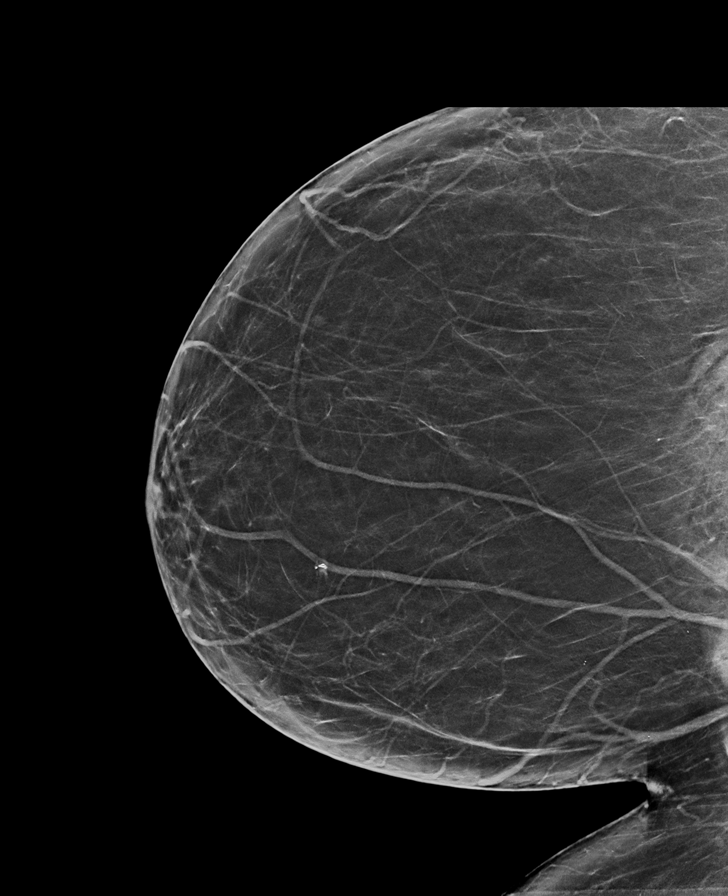

[R MLO synth-2D]
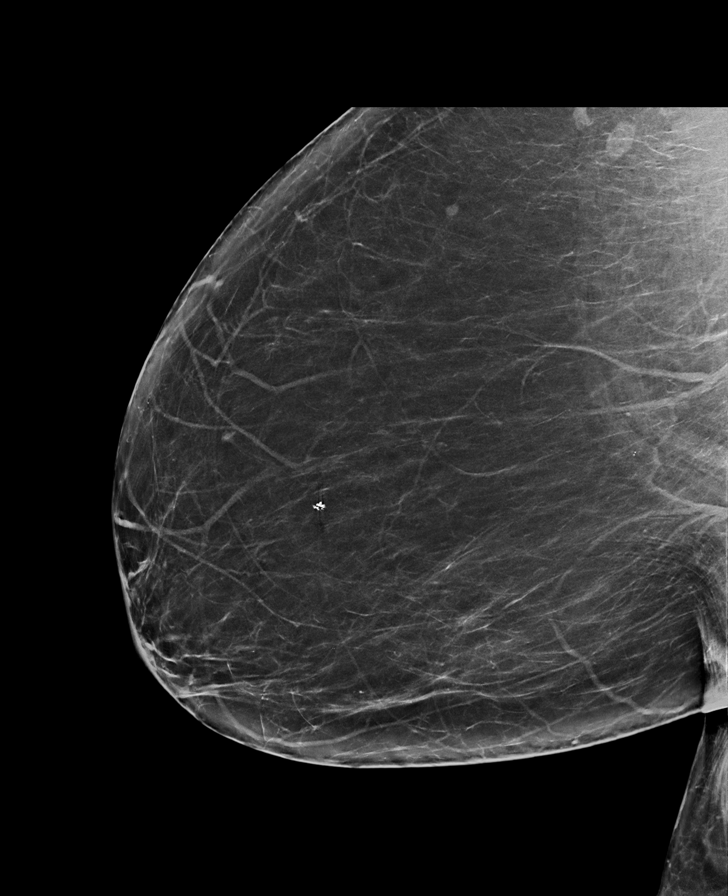

[L MLO]
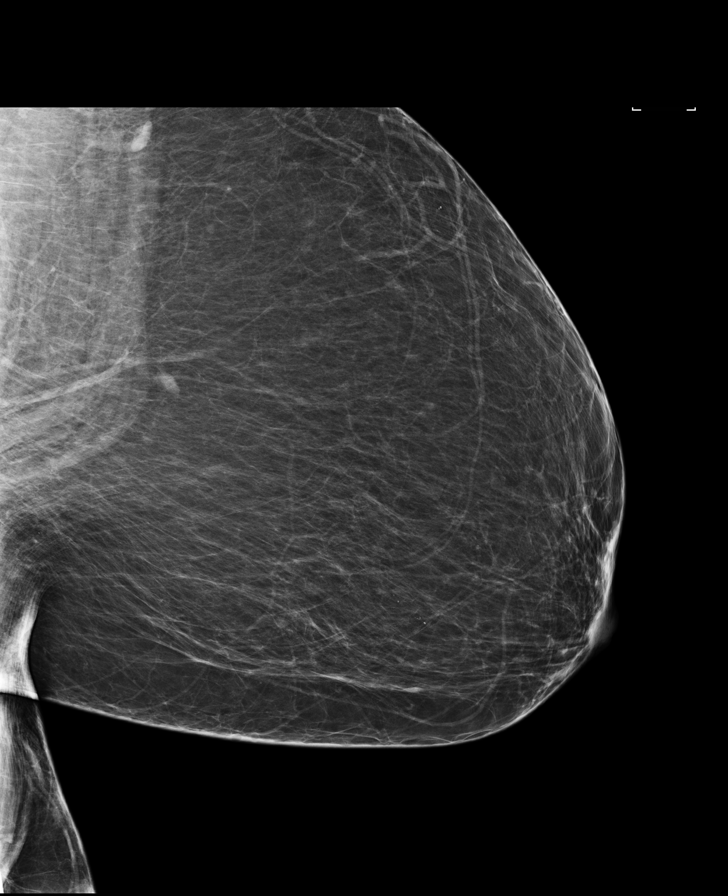

[R CC]
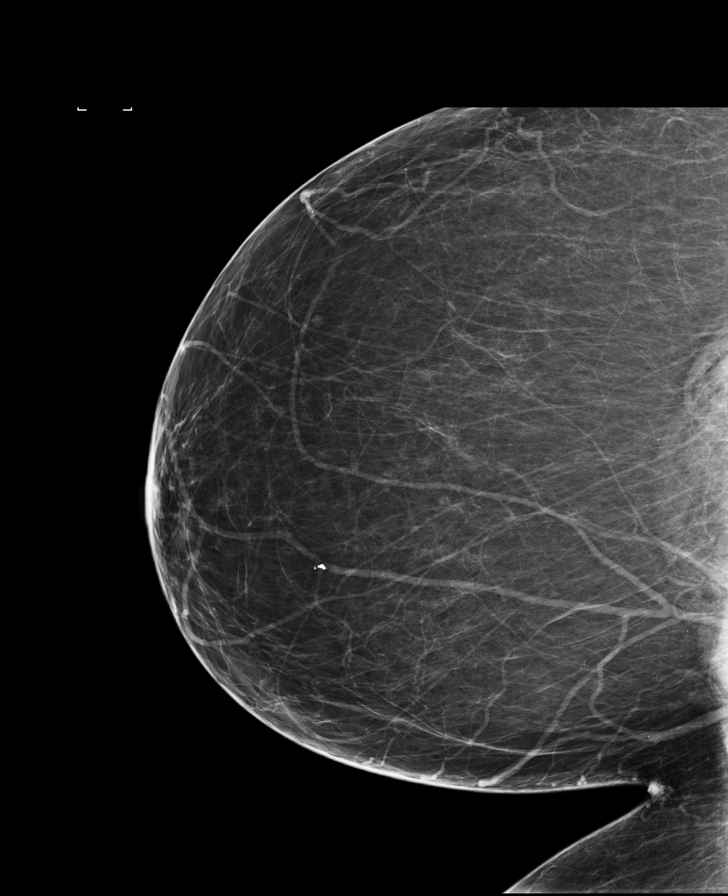

[L CC synth-2D]
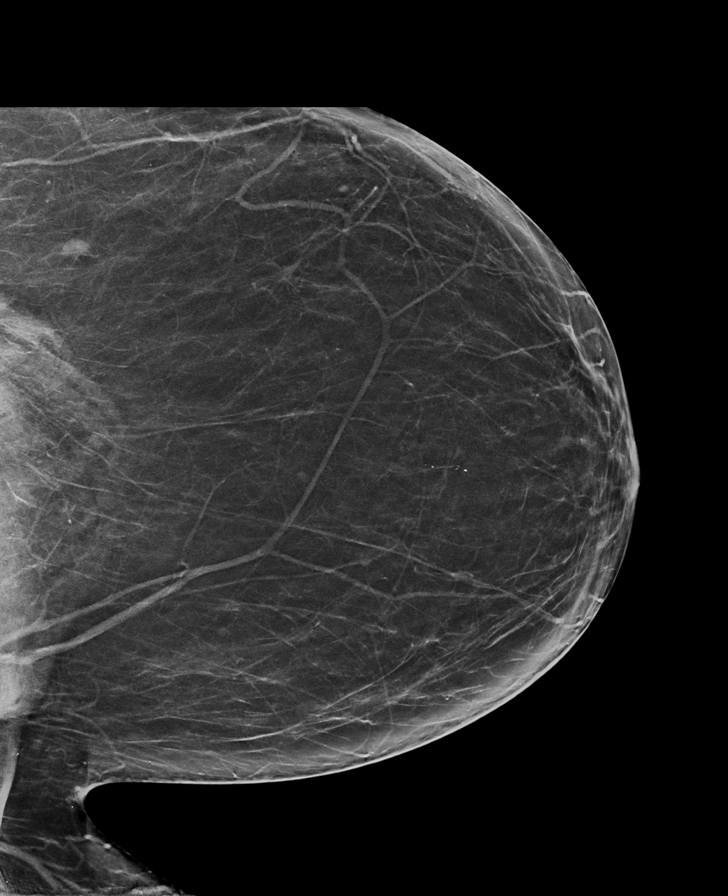

[L MLO synth-2D]
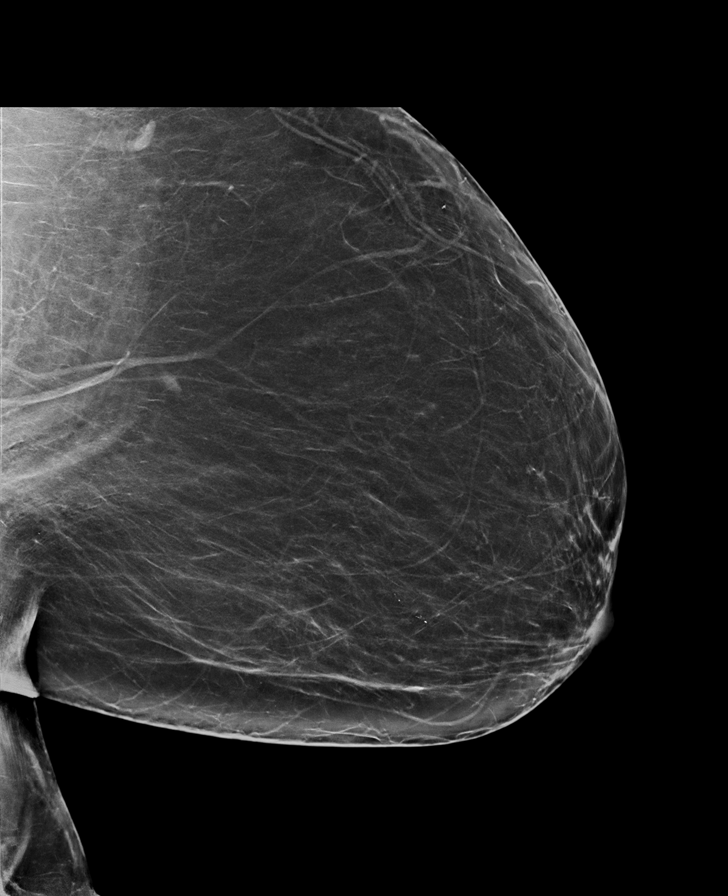

[R MLO]
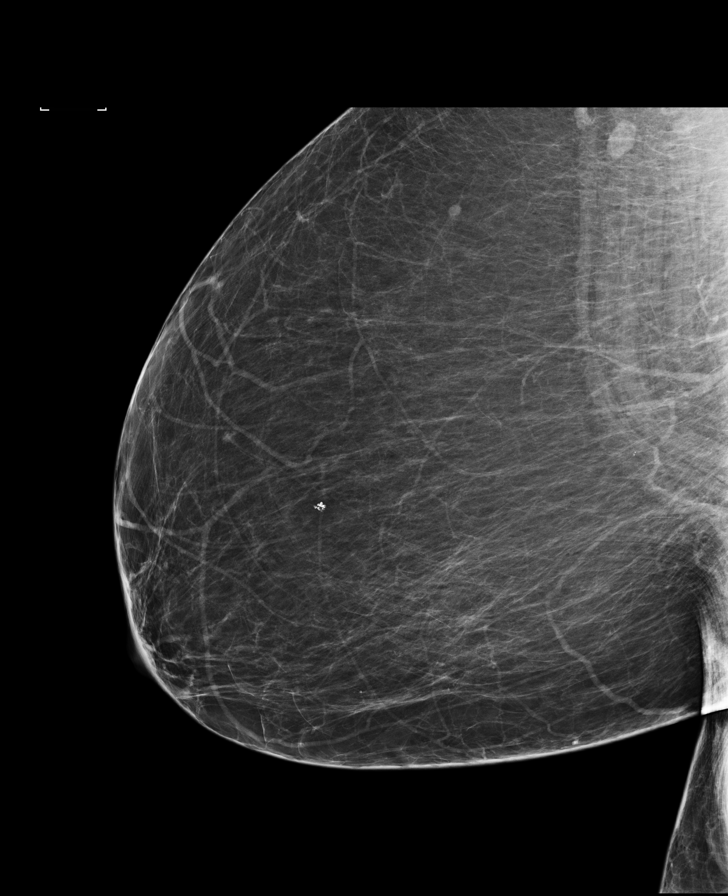

[L CC]
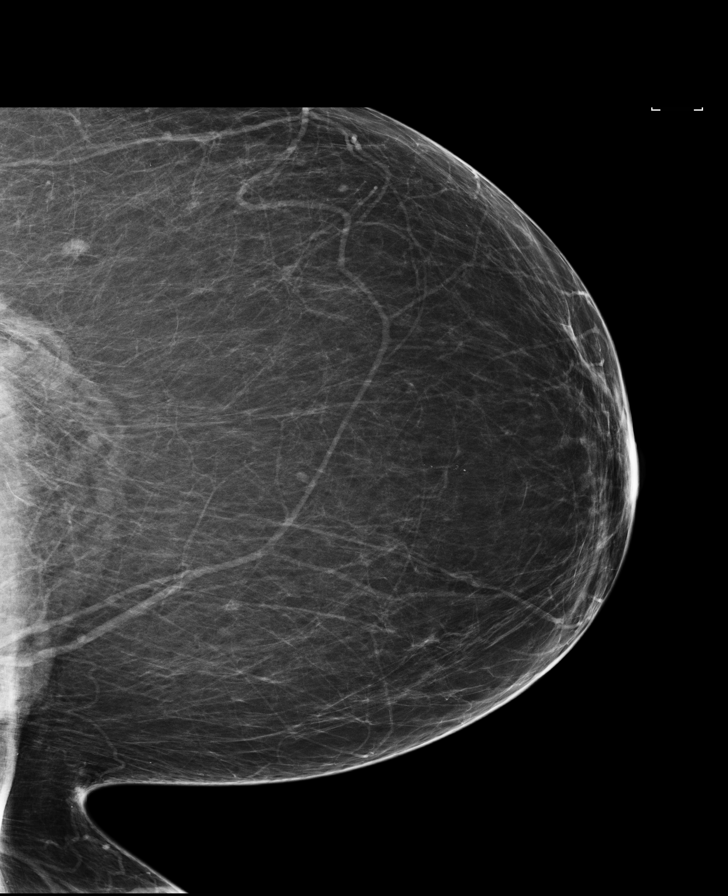

[8 of 28 positions shown; findings below may reference images not displayed]

ACR Breast Density Category b: There are scattered areas of
fibroglandular density.
FINDINGS: There are no findings suspicious for malignancy. Images were
processed with CAD.
IMPRESSION: No mammographic evidence of malignancy. A result letter of this
screening mammogram will be mailed directly to the patient.

RECOMMENDATION:
Screening mammogram in one year. (Code:[33])

BI-RADS CATEGORY  1: Negative.

## 2016-09-13 ENCOUNTER — Other Ambulatory Visit (INDEPENDENT_AMBULATORY_CARE_PROVIDER_SITE_OTHER): Payer: Self-pay | Admitting: Vascular Surgery

## 2016-09-13 DIAGNOSIS — Q2731 Arteriovenous malformation of vessel of upper limb: Secondary | ICD-10-CM

## 2016-09-17 ENCOUNTER — Ambulatory Visit: Payer: Medicare Other | Admitting: Podiatry

## 2016-09-18 ENCOUNTER — Ambulatory Visit (INDEPENDENT_AMBULATORY_CARE_PROVIDER_SITE_OTHER): Payer: Medicare Other

## 2016-09-18 ENCOUNTER — Ambulatory Visit (INDEPENDENT_AMBULATORY_CARE_PROVIDER_SITE_OTHER): Payer: Self-pay | Admitting: Vascular Surgery

## 2016-09-18 DIAGNOSIS — Q2731 Arteriovenous malformation of vessel of upper limb: Secondary | ICD-10-CM

## 2016-09-24 ENCOUNTER — Ambulatory Visit (INDEPENDENT_AMBULATORY_CARE_PROVIDER_SITE_OTHER): Payer: Medicare Other

## 2016-09-24 ENCOUNTER — Telehealth: Payer: Self-pay | Admitting: Podiatry

## 2016-09-24 ENCOUNTER — Ambulatory Visit (INDEPENDENT_AMBULATORY_CARE_PROVIDER_SITE_OTHER): Payer: Medicare Other | Admitting: Podiatry

## 2016-09-24 VITALS — BP 140/90 | HR 70 | Temp 97.8°F | Resp 16

## 2016-09-24 DIAGNOSIS — M79671 Pain in right foot: Secondary | ICD-10-CM

## 2016-09-24 DIAGNOSIS — M79672 Pain in left foot: Secondary | ICD-10-CM

## 2016-09-24 DIAGNOSIS — G609 Hereditary and idiopathic neuropathy, unspecified: Secondary | ICD-10-CM

## 2016-09-24 MED ORDER — NONFORMULARY OR COMPOUNDED ITEM: 2 refills | Status: AC

## 2016-09-24 MED ORDER — GABAPENTIN 100 MG PO CAPS: 100.0000 mg | ORAL_CAPSULE | Freq: Every day | ORAL | 1 refills | Status: DC

## 2016-09-24 NOTE — Telephone Encounter (Signed)
Pt called saying she saw where someone from the office tried calling her after she was seen. Requested a call back at [phone].

## 2016-09-24 NOTE — Progress Notes (Signed)
   Subjective:    Patient ID: Carolyn Woods, female    DOB: 14-Jun-1940, 76 y.o.   MRN: [id number]  HPI    Review of Systems     Objective:   Physical Exam        Assessment & Plan:

## 2016-09-26 NOTE — Progress Notes (Signed)
   HPI: Patient is a 76 year old female presenting today as a new patient complaining of burning and soreness to the dorsum of bilateral feet that began approximately one month ago. She states the pain began suddenly and is constant. She reports the pain is worse at night. She has applied Vicks Vapor Rub, OTC cream and has taken Tylenol as treatment. She is here for further evaluation and treatment.    Past Medical History:  Diagnosis Date  . Anemia   . Arthritis   . Candidiasis of the genitals, female   . Cystitis   . Diverticulosis   . Duodenal adenoma   . Gastritis   . Hyperlipidemia   . Hypertension   . 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 Sciatica   . Tubular adenoma      Physical Exam: General: The patient is alert and oriented x3 in no acute distress.  Dermatology: Skin is warm, dry and supple bilateral lower extremities. Negative for open lesions or macerations.  Vascular: Palpable pedal pulses bilaterally. No edema or erythema noted. Capillary refill within normal limits.  Neurological: Epicritic and protective threshold grossly intact bilaterally.   Musculoskeletal Exam: Range of motion within normal limits to all pedal and ankle joints bilateral. Muscle strength 5/5 in all groups bilateral.   Radiographic Exam:  Normal osseous mineralization. Joint spaces preserved. No fracture/dislocation/boney destruction.    Assessment: - Nocturnal peripheral neuropathy-nondiabetic    Plan of Care:  - Patient was evaluated. X-Rays reviewed.  - Prescription for peripheral neuropathy cream to be dispensed from [facility].  - Prescription for Gabapentin 100 mg QHS given to patient.  - Return to clinic in 4 weeks.    Edrick Kins, DPM [facility]  Dr. Edrick Kins, DPM    [address], [place] [address]                Office [phone]  Fax [phone]

## 2016-10-25 ENCOUNTER — Ambulatory Visit: Payer: PRIVATE HEALTH INSURANCE | Admitting: Podiatry

## 2016-10-25 ENCOUNTER — Ambulatory Visit: Payer: Medicare Other | Admitting: Podiatry

## 2016-10-29 ENCOUNTER — Encounter: Payer: Self-pay | Admitting: Podiatry

## 2016-10-29 ENCOUNTER — Ambulatory Visit (INDEPENDENT_AMBULATORY_CARE_PROVIDER_SITE_OTHER): Payer: Medicare Other | Admitting: Podiatry

## 2016-10-29 DIAGNOSIS — G609 Hereditary and idiopathic neuropathy, unspecified: Secondary | ICD-10-CM

## 2016-11-04 NOTE — Progress Notes (Signed)
   HPI: Patient is a 76 year old female presenting today for follow up evaluation of left foot pain. She states she is continuing to have pain in the dorsal aspect of the foot. She has been taking Gabapentin with some relief of the pain. She also reports a new complaint of swelling to the left ankle. She denies any pain. She is here for further evaluation and treatment.    Past Medical History:  Diagnosis Date  . Anemia   . Arthritis   . Candidiasis of the genitals, female   . Cystitis   . Diverticulosis   . Duodenal adenoma   . Gastritis   . Hyperlipidemia   . Hypertension   . 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 Sciatica   . Tubular adenoma      Physical Exam: General: The patient is alert and oriented x3 in no acute distress.  Dermatology: Skin is warm, dry and supple bilateral lower extremities. Negative for open lesions or macerations.  Vascular: Palpable pedal pulses bilaterally. No edema or erythema noted. Capillary refill within normal limits.  Neurological: Epicritic and protective threshold grossly intact bilaterally.   Musculoskeletal Exam: Range of motion within normal limits to all pedal and ankle joints bilateral. Muscle strength 5/5 in all groups bilateral.     Assessment: - Nocturnal peripheral neuropathy-nondiabetic    Plan of Care:  - Patient was evaluated. X-Rays reviewed.  - Continue using peripheral neuropathy cream..  - Continue taking Gabapentin.  - Return to clinic when necessary.    Edrick Kins, DPM [facility]  Dr. Edrick Kins, DPM    [address], [place] [address]                Office [phone]  Fax [phone]

## 2016-12-10 ENCOUNTER — Ambulatory Visit
Admission: RE | Admit: 2016-12-10 | Discharge: 2016-12-10 | Disposition: A | Discharge status: Home / Self Care | Payer: Medicare Other | Source: Ambulatory Visit | Attending: Gastroenterology | Admitting: Gastroenterology

## 2016-12-10 ENCOUNTER — Encounter: Payer: Self-pay | Admitting: *Deleted

## 2016-12-10 ENCOUNTER — Encounter
Admission: RE | Disposition: A | Discharge status: Home / Self Care | Payer: Self-pay | Source: Ambulatory Visit | Attending: Gastroenterology

## 2016-12-10 DIAGNOSIS — Z09 Encounter for follow-up examination after completed treatment for conditions other than malignant neoplasm: Secondary | ICD-10-CM | POA: Insufficient documentation

## 2016-12-10 DIAGNOSIS — Z79899 Other long term (current) drug therapy: Secondary | ICD-10-CM | POA: Insufficient documentation

## 2016-12-10 DIAGNOSIS — K5909 Other constipation: Secondary | ICD-10-CM | POA: Insufficient documentation

## 2016-12-10 DIAGNOSIS — D132 Benign neoplasm of duodenum: Secondary | ICD-10-CM | POA: Diagnosis present

## 2016-12-10 DIAGNOSIS — Z538 Procedure and treatment not carried out for other reasons: Secondary | ICD-10-CM | POA: Insufficient documentation

## 2016-12-10 SURGERY — ESOPHAGOGASTRODUODENOSCOPY (EGD) WITH PROPOFOL
Anesthesia: General

## 2016-12-10 MED ORDER — SODIUM CHLORIDE 0.9 % IV SOLN: INTRAVENOUS | Status: DC

## 2016-12-10 MED ORDER — PROPOFOL 500 MG/50ML IV EMUL
INTRAVENOUS | Status: AC
  Filled 2016-12-10: qty 50

## 2016-12-10 MED ORDER — MIDAZOLAM HCL 2 MG/2ML IJ SOLN
INTRAMUSCULAR | Status: AC
  Filled 2016-12-10: qty 2

## 2016-12-10 MED ORDER — LIDOCAINE HCL (PF) 2 % IJ SOLN
INTRAMUSCULAR | Status: AC
  Filled 2016-12-10: qty 10

## 2016-12-10 NOTE — H&P (Signed)
Please see note dictated today state.

## 2016-12-10 NOTE — Consult Note (Signed)
Patient presented for EGD today.  She had been seen by Dr Stephanie Acre, [facility], also in follow up pf the history of duodenal adenoma.  She had a EGD with forward and side viewing scope on 05/01/16, and noted no recurrance of the adenoma.  Recommendation at that time was to repeat in one year.  As such will hold procedure today.  I discussed with patient.   She asked questions about he chronic constipation .   She currently takes Amitiza which has been causing some stomach upset.  Will hold that and start on Linzess 145 mcg daily with OV followup in 3-4 weeks.

## 2017-03-07 ENCOUNTER — Other Ambulatory Visit: Payer: Self-pay | Admitting: Family Medicine

## 2017-03-07 DIAGNOSIS — Z1231 Encounter for screening mammogram for malignant neoplasm of breast: Secondary | ICD-10-CM

## 2017-04-10 ENCOUNTER — Ambulatory Visit
Admission: RE | Admit: 2017-04-10 | Discharge: 2017-04-10 | Disposition: A | Discharge status: Home / Self Care | Payer: Medicare Other | Source: Ambulatory Visit | Attending: Family Medicine | Admitting: Family Medicine

## 2017-04-10 DIAGNOSIS — Z1231 Encounter for screening mammogram for malignant neoplasm of breast: Secondary | ICD-10-CM

## 2017-04-10 IMAGING — MG MM DIGITAL SCREENING BILAT W/ TOMO W/ CAD
8 of 12 series · 8 of 28 positions shown · non-contrast
Comparison: Previous exam(s).

CLINICAL DATA: Screening.

EXAM:
DIGITAL SCREENING BILATERAL MAMMOGRAM WITH TOMO AND CAD

[R MLO synth-2D]
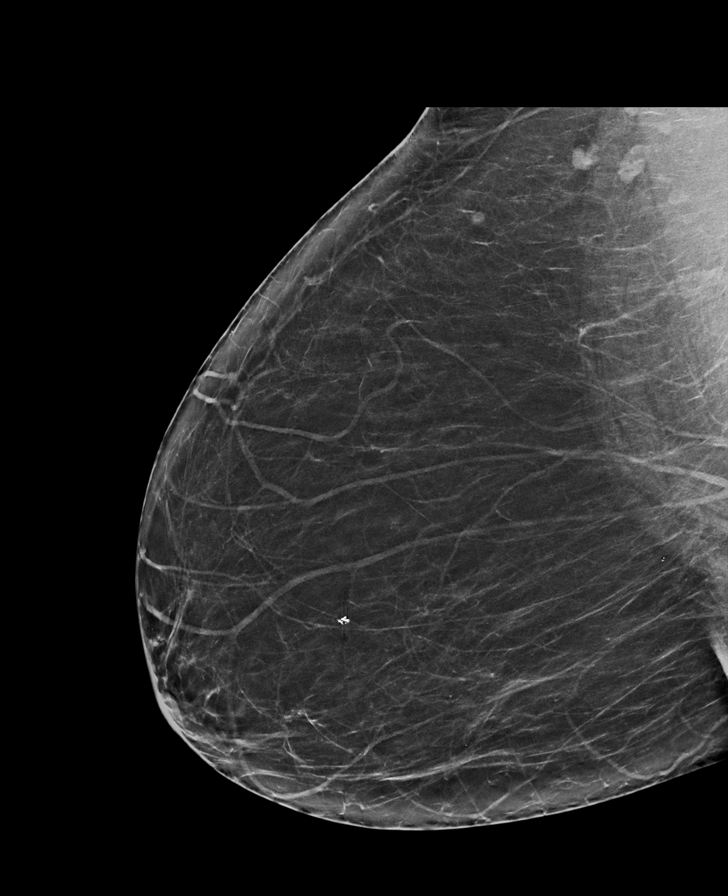

[L CC]
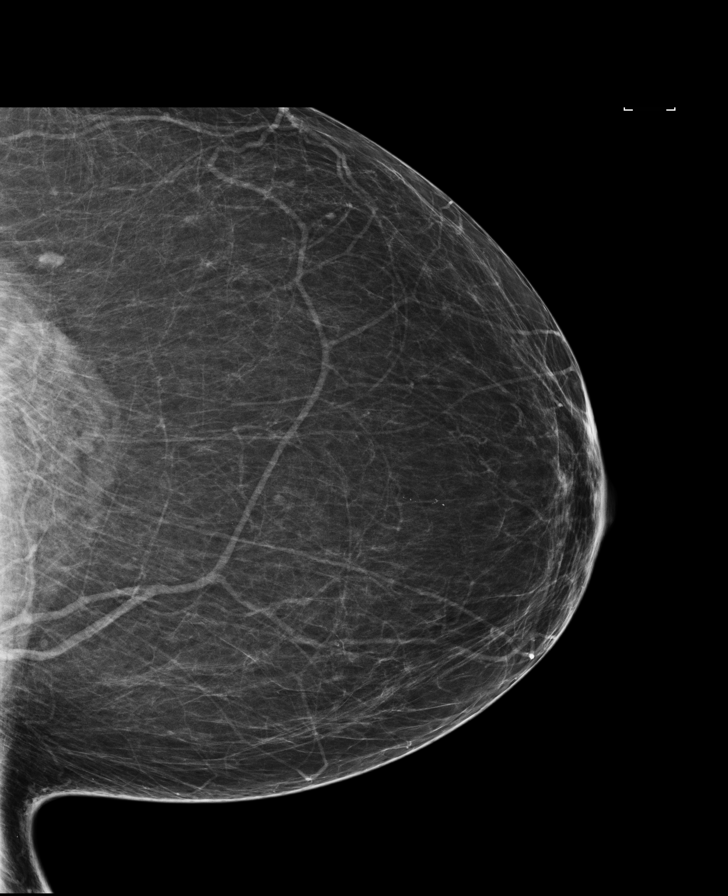

[R MLO]
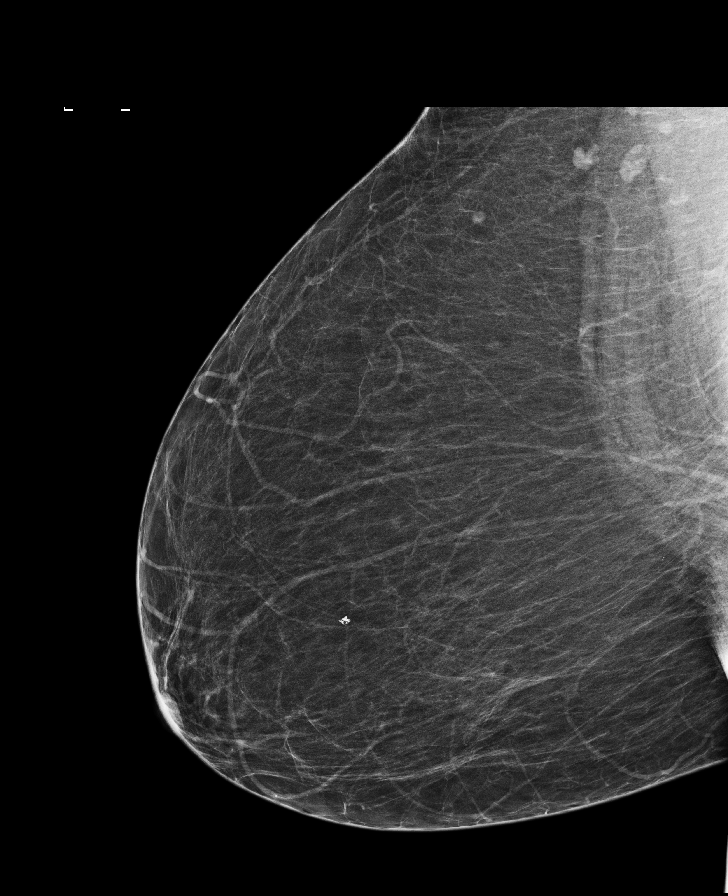

[L MLO synth-2D]
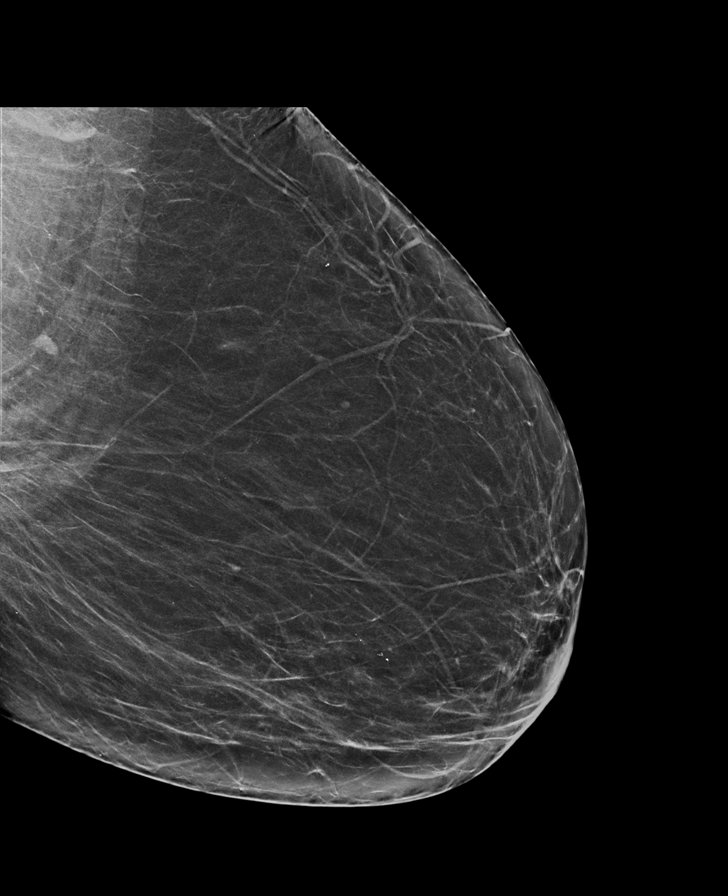

[R CC synth-2D]
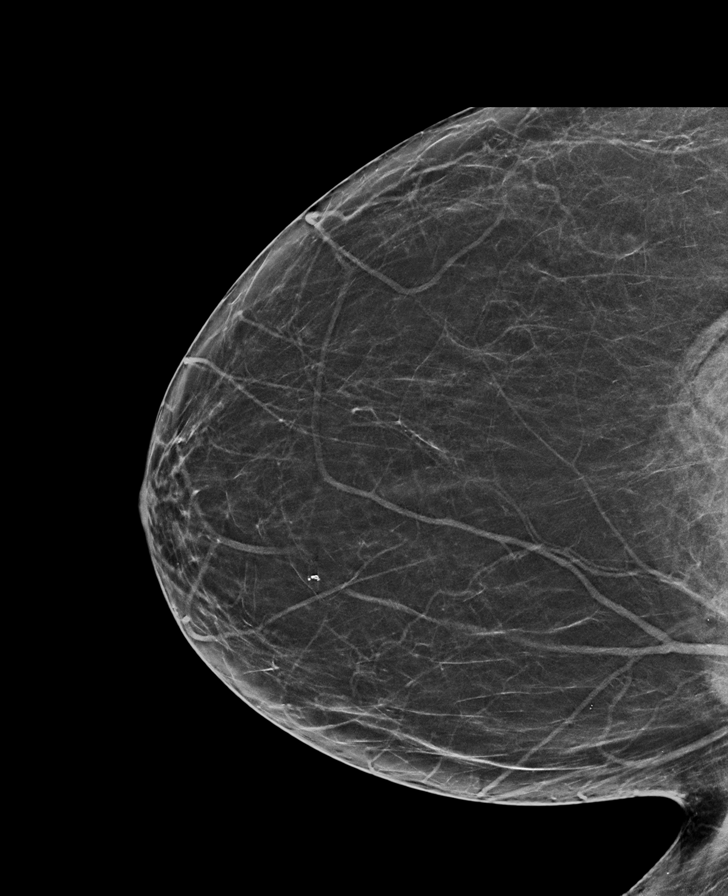

[L MLO]
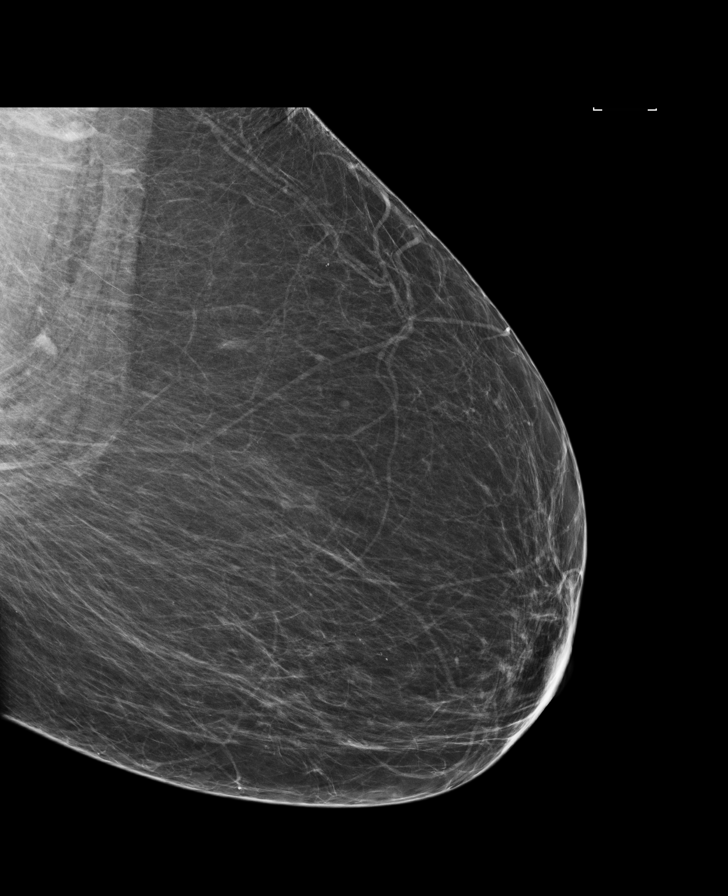

[L CC synth-2D]
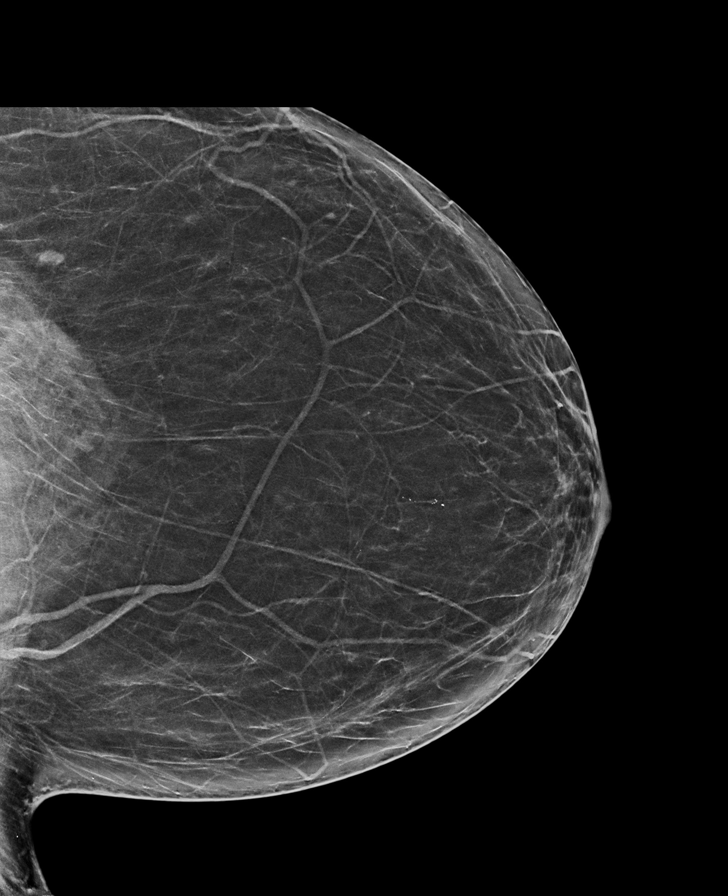

[R CC]
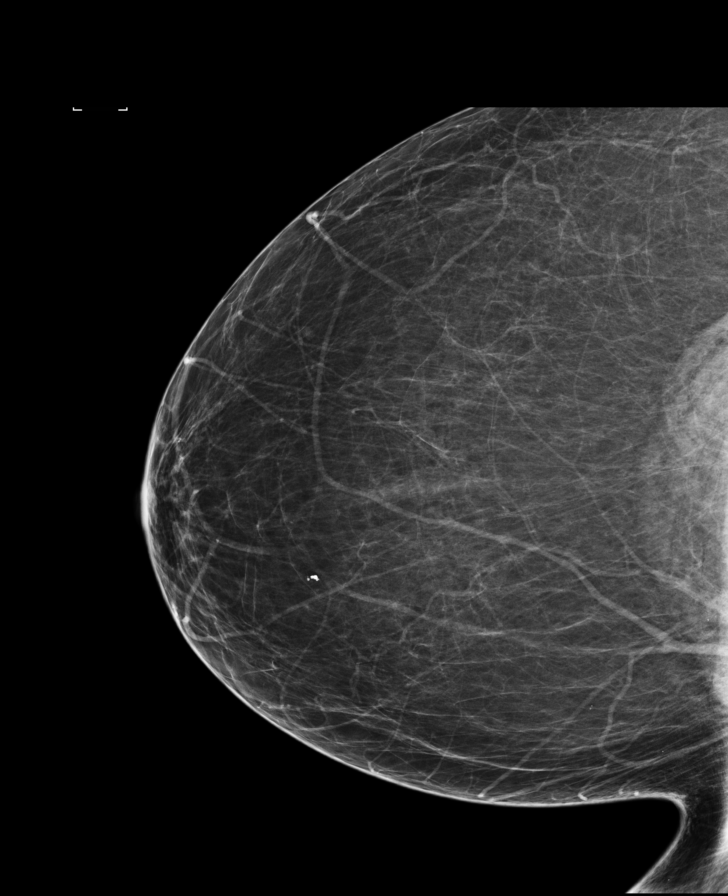

[8 of 28 positions shown; findings below may reference images not displayed]

ACR Breast Density Category b: There are scattered areas of
fibroglandular density.
FINDINGS: There are no findings suspicious for malignancy. Images were
processed with CAD.
IMPRESSION: No mammographic evidence of malignancy. A result letter of this
screening mammogram will be mailed directly to the patient.

RECOMMENDATION:
Screening mammogram in one year. (Code:[TQ])

BI-RADS CATEGORY  1: Negative.

## 2017-04-29 ENCOUNTER — Ambulatory Visit
Admission: RE | Admit: 2017-04-29 | Discharge: 2017-04-29 | Disposition: A | Discharge status: Home / Self Care | Payer: Medicare Other | Source: Ambulatory Visit | Attending: Gastroenterology | Admitting: Gastroenterology

## 2017-04-29 ENCOUNTER — Ambulatory Visit: Payer: Medicare Other | Admitting: Certified Registered"

## 2017-04-29 ENCOUNTER — Encounter: Payer: Self-pay | Admitting: *Deleted

## 2017-04-29 ENCOUNTER — Encounter
Admission: RE | Disposition: A | Discharge status: Home / Self Care | Payer: Self-pay | Source: Ambulatory Visit | Attending: Gastroenterology

## 2017-04-29 DIAGNOSIS — Z79899 Other long term (current) drug therapy: Secondary | ICD-10-CM | POA: Diagnosis not present

## 2017-04-29 DIAGNOSIS — Z79890 Hormone replacement therapy (postmenopausal): Secondary | ICD-10-CM | POA: Diagnosis not present

## 2017-04-29 DIAGNOSIS — Z08 Encounter for follow-up examination after completed treatment for malignant neoplasm: Secondary | ICD-10-CM | POA: Insufficient documentation

## 2017-04-29 DIAGNOSIS — E785 Hyperlipidemia, unspecified: Secondary | ICD-10-CM | POA: Diagnosis not present

## 2017-04-29 DIAGNOSIS — Z885 Allergy status to narcotic agent status: Secondary | ICD-10-CM | POA: Diagnosis not present

## 2017-04-29 DIAGNOSIS — Z7982 Long term (current) use of aspirin: Secondary | ICD-10-CM | POA: Diagnosis not present

## 2017-04-29 DIAGNOSIS — Z538 Procedure and treatment not carried out for other reasons: Secondary | ICD-10-CM | POA: Insufficient documentation

## 2017-04-29 DIAGNOSIS — Z888 Allergy status to other drugs, medicaments and biological substances status: Secondary | ICD-10-CM | POA: Insufficient documentation

## 2017-04-29 DIAGNOSIS — I10 Essential (primary) hypertension: Secondary | ICD-10-CM | POA: Diagnosis not present

## 2017-04-29 SURGERY — ESOPHAGOGASTRODUODENOSCOPY (EGD) WITH PROPOFOL
Anesthesia: General

## 2017-04-29 MED ORDER — SODIUM CHLORIDE 0.9 % IV SOLN: INTRAVENOUS | Status: DC

## 2017-04-29 MED ORDER — PROPOFOL 500 MG/50ML IV EMUL
INTRAVENOUS | Status: AC
  Filled 2017-04-29: qty 50

## 2017-04-29 MED ORDER — LIDOCAINE HCL (PF) 2 % IJ SOLN
INTRAMUSCULAR | Status: AC
  Filled 2017-04-29: qty 10

## 2017-04-29 MED ORDER — SODIUM CHLORIDE 0.9 % IV SOLN
INTRAVENOUS | Status: DC
  Administered 2017-04-29: 1000 mL via INTRAVENOUS

## 2017-04-29 MED ORDER — PROPOFOL 10 MG/ML IV BOLUS
INTRAVENOUS | Status: AC
  Filled 2017-04-29: qty 20

## 2017-04-29 NOTE — H&P (Signed)
Outpatient short stay form Pre-procedure 04/29/2017 9:37 AM Lollie Sails MD  Primary Physician: Dr. 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  Her last EGD was about a year ago at [facility].  At time there is apparently no evidence of duodenal duodenal adenoma.  sHe has had surgery for this in the past.    Current Facility-Administered Medications:  .  0.9 %  sodium chloride infusion, , Intravenous, Continuous, Lollie Sails, MD .  0.9 %  sodium chloride infusion, , Intravenous, Continuous, Lollie Sails, MD  Medications Prior to Admission  Medication Sig Dispense Refill Last Dose  . amLODipine (NORVASC) 2.5 MG tablet Take 2.5 mg by mouth.   04/29/2017 at Unknown time  . atenolol (TENORMIN) 25 MG tablet Take by mouth.   04/29/2017 at Unknown time  . estradiol (ESTRACE) 1 MG tablet Take 1 mg by mouth 2 (two) times a week.     . lubiprostone (AMITIZA) 8 MCG capsule Take 8 mcg by mouth 2 (two) times daily with a meal.     . aspirin EC 81 MG tablet Take 81 mg by mouth daily.   Past Week at Unknown time  . NONFORMULARY OR COMPOUNDED ITEM See pharmacy note 120 each 2   . pantoprazole (PROTONIX) 40 MG tablet TAKE 1 TABLET BY MOUTH TWICE DAILY     . simvastatin (ZOCOR) 20 MG tablet Take 20 mg by mouth.     . Vitamin D, Ergocalciferol, (DRISDOL) 50000 units CAPS capsule TK ONE C PO ONCE A WEEK        Allergies  Allergen Reactions  . Linzess [Linaclotide]   . Codeine Rash     Past Medical History:  Diagnosis Date  . Anemia   . Arthritis   . Candidiasis of the genitals, female   . Cystitis   . Diverticulosis   . Duodenal adenoma   . Gastritis   . Hyperlipidemia   . Hypertension   . 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 Sciatica   . Tubular adenoma     Review of systems:      Physical Exam    Heart and lungs:    HEENT:    Other:   Pertinant exam for procedure:    Planned proceedures: Patient presented today for EGD in regards to her history of recurrent duodenal adenoma.  On further chart review her last upper procedure was done at [facility] by Dr. Stephanie Acre.  At that time he had recommended a repeat evaluation using a side-viewing scope in about a year.  As such I discussed this further with the patient and we will get her arranged for that procedure at [facility] rather than pursuing scope today.  She is in agreement with that.    Lollie Sails, MD Gastroenterology 04/29/2017  9:37 AM

## 2017-04-29 NOTE — Anesthesia Preprocedure Evaluation (Signed)
Anesthesia Evaluation  Patient identified by MRN, date of birth, ID band Patient awake    Reviewed: Allergy & Precautions, NPO status , Patient's Chart, lab work & pertinent test results, reviewed documented beta blocker date and time   Airway Mallampati: III  TM Distance: >3 FB     Dental  (+) Chipped   Pulmonary           Cardiovascular hypertension, Pt. on medications and Pt. on home beta blockers      Neuro/Psych  Neuromuscular disease    GI/Hepatic   Endo/Other    Renal/GU      Musculoskeletal  (+) Arthritis ,   Abdominal   Peds  Hematology  (+) anemia ,   Anesthesia Other Findings Protruding teeth. Dentures will not come out. Difficult intubation.  Reproductive/Obstetrics                             Anesthesia Physical Anesthesia Plan  ASA: III  Anesthesia Plan: General   Post-op Pain Management:    Induction: Intravenous  PONV Risk Score and Plan:   Airway Management Planned:   Additional Equipment:   Intra-op Plan:   Post-operative Plan:   Informed Consent: I have reviewed the patients History and Physical, chart, labs and discussed the procedure including the risks, benefits and alternatives for the proposed anesthesia with the patient or authorized representative who has indicated his/her understanding and acceptance.     Plan Discussed with: CRNA  Anesthesia Plan Comments:         Anesthesia Quick Evaluation

## 2017-04-30 DIAGNOSIS — Z08 Encounter for follow-up examination after completed treatment for malignant neoplasm: Secondary | ICD-10-CM | POA: Diagnosis not present

## 2017-09-30 ENCOUNTER — Ambulatory Visit (INDEPENDENT_AMBULATORY_CARE_PROVIDER_SITE_OTHER): Payer: Medicare Other | Admitting: Vascular Surgery

## 2017-09-30 ENCOUNTER — Encounter (INDEPENDENT_AMBULATORY_CARE_PROVIDER_SITE_OTHER): Payer: Self-pay | Admitting: Vascular Surgery

## 2017-09-30 VITALS — BP 170/84 | HR 77 | Resp 13 | Ht 62.0 in | Wt 180.0 lb

## 2017-09-30 DIAGNOSIS — I720 Aneurysm of carotid artery: Secondary | ICD-10-CM | POA: Diagnosis not present

## 2017-09-30 DIAGNOSIS — I10 Essential (primary) hypertension: Secondary | ICD-10-CM | POA: Diagnosis not present

## 2017-09-30 DIAGNOSIS — E785 Hyperlipidemia, unspecified: Secondary | ICD-10-CM

## 2017-09-30 DIAGNOSIS — I728 Aneurysm of other specified arteries: Secondary | ICD-10-CM

## 2017-09-30 NOTE — Assessment & Plan Note (Signed)
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  I would recommend getting a CT angiogram of the chest and neck at this point for further evaluation.  I have discussed the pathophysiology and natural history with the patient.  We will see her back following the studies to discuss the results and determine further treatment options.

## 2017-09-30 NOTE — Progress Notes (Signed)
Patient ID: Carolyn Woods, female   DOB: 02-21-1940, 77 y.o.   MRN: [id number]  Chief Complaint  Patient presents with  . Follow-up    Aneurysm of Subclavian    HPI Carolyn Woods is a 77 y.o. female.  I am asked to see the patient by Dr. Kary Kos for evaluation of right subclavian and innominate artery aneurysms.  The patient reports noticeable enlargement of the artery in her right neck and clavicle area.  She does have some occasional dysphasia.  It is not overtly painful.  No focal neurologic symptoms. Specifically, the patient denies amaurosis fugax, speech or swallowing difficulties, or arm or leg weakness or numbness.  This has been seen on imaging studies dating back to about 2014.  It was last checked out a year ago as a lab only study in our office but she has not seen a physician here that I can find.  At that time, it measured about 1.7 to 1.8 cm in maximal diameter and there was no hemodynamically significant stenosis.   Past Medical History:  Diagnosis Date  . Anemia   . Arthritis   . Candidiasis of the genitals, female   . Cystitis   . Diverticulosis   . Duodenal adenoma   . Gastritis   . Hyperlipidemia   . Hypertension   . 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 Sciatica   . Tubular adenoma     Past Surgical History:  Procedure Laterality Date  . ABDOMINAL HYSTERECTOMY    . COLONOSCOPY  02/20/10  . ERCP    . ESOPHAGOGASTRODUODENOSCOPY (EGD) WITH PROPOFOL N/A 10/25/2014   Procedure: ESOPHAGOGASTRODUODENOSCOPY (EGD) WITH PROPOFOL;  Surgeon: Lollie Sails, MD;  Location: [facility] ENDOSCOPY;  Service: Endoscopy;  Laterality: N/A;  . ESOPHAGOGASTRODUODENOSCOPY (EGD) WITH PROPOFOL N/A 12/05/2015   Procedure: ESOPHAGOGASTRODUODENOSCOPY (EGD) WITH PROPOFOL;  Surgeon: Lollie Sails, MD;  Location: [facility] ENDOSCOPY;  Service: Endoscopy;  Laterality: N/A;    Family History  Problem Relation Age of Onset  . Breast cancer Neg Hx   No bleeding disorders, clotting  disorders, autoimmune diseases, or aneurysms  Social History Social History   Tobacco Use  . Smoking status: Never Smoker  . Smokeless tobacco: Never Used  Substance Use Topics  . Alcohol use: No    Alcohol/week: 1.0 standard drinks    Types: 1 Glasses of wine per week  . Drug use: No    Allergies  Allergen Reactions  . Linzess [Linaclotide]   . Codeine Rash    Current Outpatient Medications  Medication Sig Dispense Refill  . amLODipine (NORVASC) 2.5 MG tablet Take 2.5 mg by mouth.    Marland Kitchen aspirin EC 81 MG tablet Take 81 mg by mouth daily.    Marland Kitchen atenolol (TENORMIN) 25 MG tablet Take by mouth.    . estradiol (ESTRACE) 1 MG tablet Take 1 mg by mouth 2 (two) times a week.    . lubiprostone (AMITIZA) 8 MCG capsule Take 8 mcg by mouth 2 (two) times daily with a meal.    . NONFORMULARY OR COMPOUNDED ITEM See pharmacy note 120 each 2  . pantoprazole (PROTONIX) 40 MG tablet TAKE 1 TABLET BY MOUTH TWICE DAILY    . simvastatin (ZOCOR) 20 MG tablet Take 20 mg by mouth.    . Vitamin D, Ergocalciferol, (DRISDOL) 50000 units CAPS capsule TK ONE C PO ONCE A WEEK     No current facility-administered medications for this visit.       REVIEW OF  SYSTEMS (Negative unless checked)  Constitutional: [] Weight loss  [] Fever  [] Chills Cardiac: [] Chest pain   [] Chest pressure   [] Palpitations   [] Shortness of breath when laying flat   [] Shortness of breath at rest   [] Shortness of breath with exertion. Vascular:  [] Pain in legs with walking   [] Pain in legs at rest   [] Pain in legs when laying flat   [] Claudication   [] Pain in feet when walking  [] Pain in feet at rest  [] Pain in feet when laying flat   [] History of DVT   [] Phlebitis   [] Swelling in legs   [] Varicose veins   [] Non-healing ulcers Pulmonary:   [] Uses home oxygen   [] Productive cough   [] Hemoptysis   [] Wheeze  [] COPD   [] Asthma Neurologic:  [] Dizziness  [] Blackouts   [] Seizures   [] History of stroke   [] History of TIA  [] Aphasia    [] Temporary blindness   [] Dysphagia   [] Weakness or numbness in arms   [] Weakness or numbness in legs Musculoskeletal:  [] Arthritis   [] Joint swelling   [] Joint pain   [] Low back pain Hematologic:  [] Easy bruising  [] Easy bleeding   [] Hypercoagulable state   [] Anemic  [] Hepatitis Gastrointestinal:  [] Blood in stool   [] Vomiting blood  [] Gastroesophageal reflux/heartburn   [] Abdominal pain Genitourinary:  [] Chronic kidney disease   [] Difficult urination  [] Frequent urination  [] Burning with urination   [] Hematuria Skin:  [] Rashes   [] Ulcers   [] Wounds Psychological:  [] History of anxiety   []  History of major depression.    Physical Exam BP (!) 170/84 (BP Location: Right Arm, Patient Position: Sitting)   Pulse 77   Resp 13   Ht 5\' 2"  (1.575 m)   Wt 180 lb (81.6 kg)   BMI 32.92 kg/m  Gen:  WD/WN, NAD Head: [place]/AT, No temporalis wasting. Prominent temp pulse not noted. Ear/Nose/Throat: Hearing grossly intact, nares w/o erythema or drainage, oropharynx w/o Erythema/Exudate Eyes: Conjunctiva clear, sclera non-icteric  Neck: trachea midline.  Visibly pulsatile right carotid artery.  No bruit Pulmonary:  Good air movement, clear to auscultation bilaterally.  Cardiac: RRR, no JVD Vascular:  Vessel Right Left  Radial Palpable Palpable                                   Gastrointestinal: soft, non-tender/non-distended.  Musculoskeletal: M/S 5/5 throughout.  Extremities without ischemic changes.  No deformity or atrophy Neurologic: Sensation grossly intact in extremities.  Symmetrical.  Speech is fluent. Motor exam as listed above. Psychiatric: Judgment intact, Mood & affect appropriate for pt's clinical situation. Dermatologic: No rashes or ulcers noted.  No cellulitis or open wounds.   Radiology No results found.  Labs No results found for this or any previous visit (from the past 2160 hour(s)).  Assessment/Plan:  Hypertension blood pressure control important in reducing the  progression of atherosclerotic disease and aneurysmal degeneration. On appropriate oral medications.   Hyperlipidemia lipid control important in reducing the progression of atherosclerotic disease. 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  I would recommend getting a CT angiogram of the chest and neck at this point for further evaluation.  I have discussed the pathophysiology and natural history with the patient.  We will see her back following the studies to discuss the results and determine  further treatment options.      Leotis Pain 09/30/2017, 2:04 PM   This note was created with Dragon medical transcription system.  Any errors from dictation are unintentional.

## 2017-09-30 NOTE — Assessment & Plan Note (Signed)
lipid control important in reducing the progression of atherosclerotic disease. Continue statin therapy  

## 2017-09-30 NOTE — Assessment & Plan Note (Addendum)
blood pressure control important in reducing the progression of atherosclerotic disease and aneurysmal degeneration. On appropriate oral medications.  

## 2017-10-08 ENCOUNTER — Other Ambulatory Visit (INDEPENDENT_AMBULATORY_CARE_PROVIDER_SITE_OTHER): Payer: Self-pay | Admitting: Vascular Surgery

## 2017-10-08 ENCOUNTER — Telehealth (INDEPENDENT_AMBULATORY_CARE_PROVIDER_SITE_OTHER): Payer: Self-pay

## 2017-10-08 DIAGNOSIS — I728 Aneurysm of other specified arteries: Secondary | ICD-10-CM

## 2017-10-08 DIAGNOSIS — I720 Aneurysm of carotid artery: Secondary | ICD-10-CM

## 2017-10-08 NOTE — Telephone Encounter (Deleted)
ERROR

## 2017-10-08 NOTE — Telephone Encounter (Signed)
The question for radiology was answered

## 2017-10-09 ENCOUNTER — Ambulatory Visit
Admission: RE | Admit: 2017-10-09 | Discharge: 2017-10-09 | Disposition: A | Discharge status: Home / Self Care | Payer: Medicare Other | Source: Ambulatory Visit | Attending: Vascular Surgery | Admitting: Vascular Surgery

## 2017-10-09 DIAGNOSIS — I728 Aneurysm of other specified arteries: Secondary | ICD-10-CM | POA: Diagnosis not present

## 2017-10-09 DIAGNOSIS — I712 Thoracic aortic aneurysm, without rupture: Secondary | ICD-10-CM | POA: Diagnosis not present

## 2017-10-09 DIAGNOSIS — I720 Aneurysm of carotid artery: Secondary | ICD-10-CM | POA: Insufficient documentation

## 2017-10-09 HISTORY — DX: Malignant neoplasm of cervix uteri, unspecified: C53.9

## 2017-10-09 IMAGING — CT CT ANGIO NECK
2 of 7 series · 7 of 35 positions shown · IV contrast (iopamidol)
Comparison: [DATE] neck CTA. Carotid Doppler ultrasound report
[DATE].

CLINICAL DATA: Innominate and right subclavian artery aneurysms.

EXAM:
CT ANGIOGRAPHY NECK
TECHNIQUE: Multidetector CT imaging of the neck was performed using the
standard protocol during bolus administration of intravenous
contrast. Multiplanar CT image reconstructions and MIPs were
obtained to evaluate the vascular anatomy. Carotid stenosis
measurements (when applicable) are obtained utilizing NASCET
criteria, using the distal internal carotid diameter as the
denominator.
CONTRAST:  75mL [29] IOPAMIDOL ([29]) INJECTION 76%

[Series 7: ax thin mips cta neck · axial · 0.47mm/px · z∈[-755,-641]mm · 4 of 192 slices shown]
[im 39/192  soft-tissue]
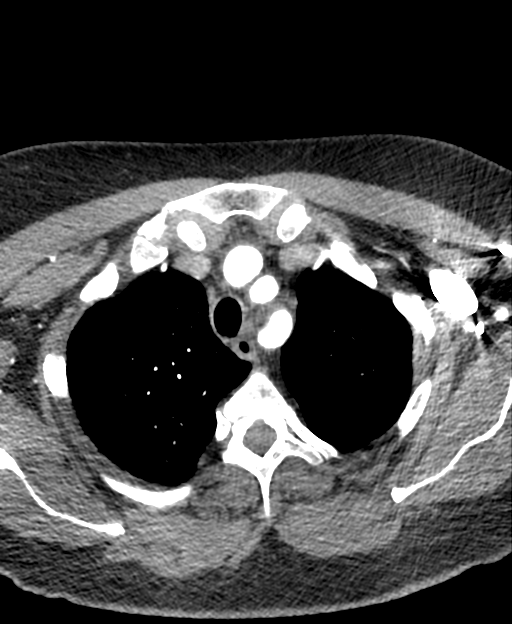
[im 77/192  bone]
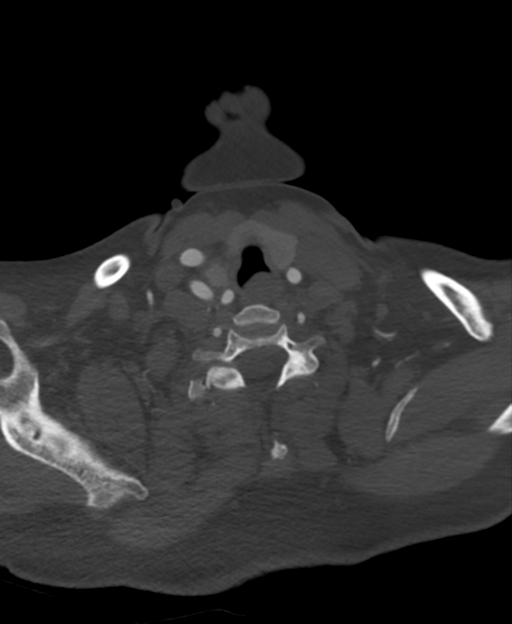
[im 115/192  soft-tissue]
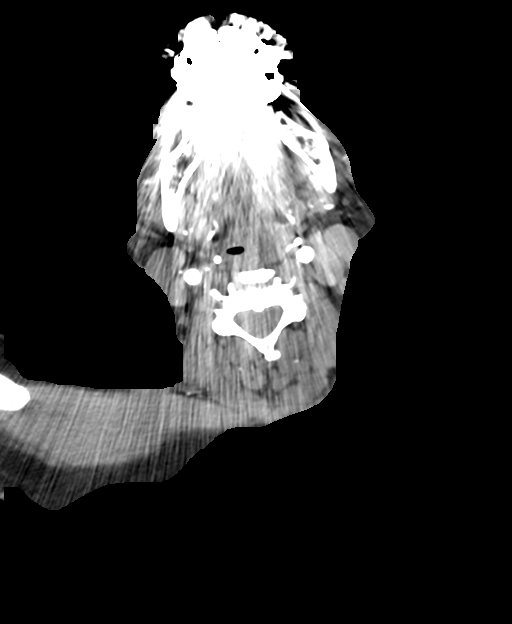
[im 153/192  bone]
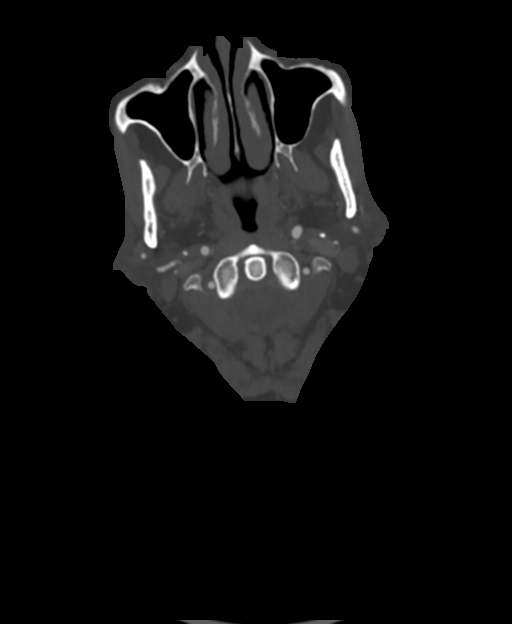

[Series 11: sag thin mips cta neck · sagittal · 0.38mm/px · 3 of 240 slices shown]
[im 38/240  soft-tissue]
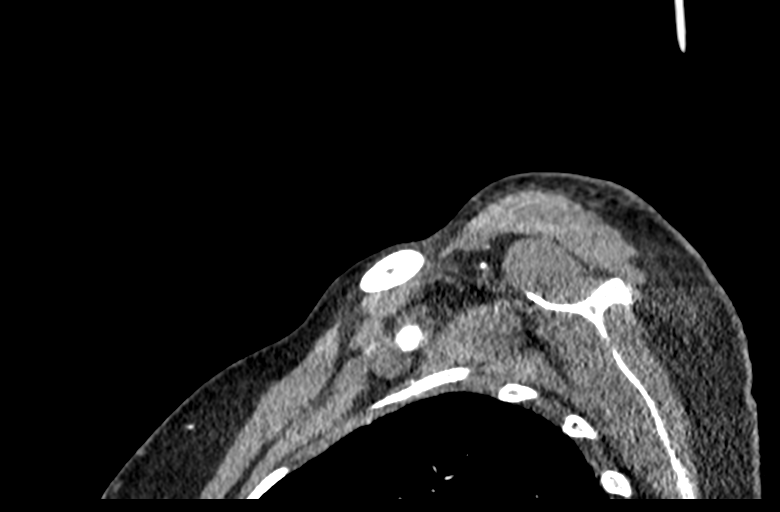
[im 120/240  soft-tissue]
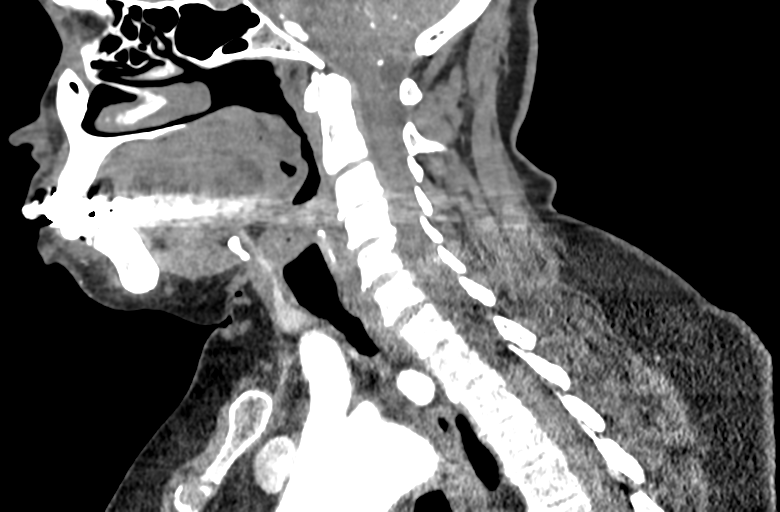
[im 203/240  soft-tissue]
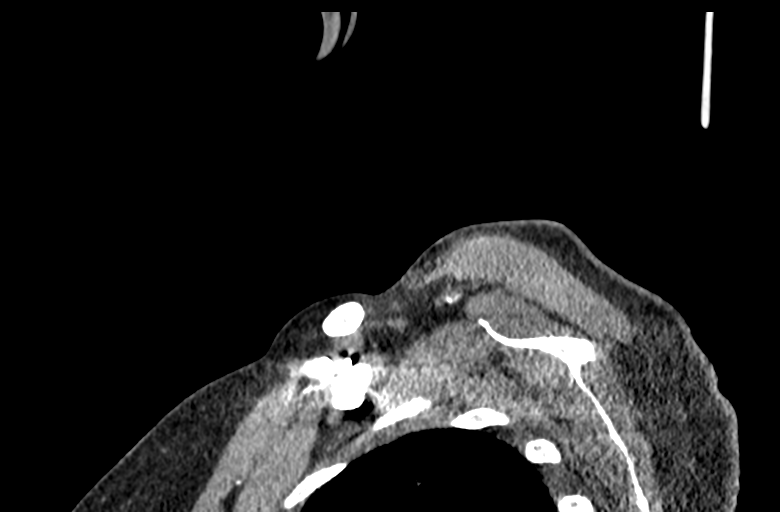

[7 of 35 positions shown; findings below may reference images not displayed]

FINDINGS: Aortic arch: Standard 3 vessel aortic arch. Partially visualized
aneurysmal dilatation of the distal ascending and proximal
descending thoracic aorta with diameters of 4.0 cm and 3.8 cm,
respectively. There is a generalized ectatic appearance of the
innominate and proximal right subclavian arteries which each measure
up to 1.6 cm in maximal diameter, not significantly changed from
[29]. No saccular aneurysm is present.

Right carotid system: Patent with minimal calcified plaque at the
carotid bifurcation. No evidence of stenosis or dissection. Tortuous
proximal common carotid artery.

Left carotid system: Patent with mild calcified plaque at the
carotid bifurcation. No evidence of significant stenosis or
dissection. Tortuous proximal common carotid artery.

Vertebral arteries: Patent and codominant without evidence of
stenosis or dissection. Minimal calcified plaque at the right
vertebral artery origin.

Skeleton: Mild-to-moderate cervical disc and facet degeneration. No
suspicious osseous lesion.

Other neck: No evidence of acute abnormality. No lymph node
enlargement.

Upper chest: Clear lung apices.
IMPRESSION: 1. Unchanged ectasia of the innominate and proximal right subclavian
arteries with maximal diameter 1.6 cm.
2. Partially visualized aneurysmal dilatation of the ascending
thoracic aorta, 4.0 cm diameter. Recommend annual imaging followup
by CTA or MRA. This recommendation follows [29]
ACCF/AHA/AATS/ACR/ASA/SCA/SORPRESAS/SORPRESAS/SORPRESAS/SORPRESAS Guidelines for the
Diagnosis and Management of Patients with Thoracic Aortic Disease.
Circulation. [29]; 121: e266-e369
3. Mild carotid artery atherosclerosis without stenosis.

## 2017-10-09 MED ORDER — IOPAMIDOL (ISOVUE-370) INJECTION 76%
75.0000 mL | Freq: Once | INTRAVENOUS | Status: AC | PRN
Start: 2017-10-09 — End: 2017-10-09
  Administered 2017-10-09: 75 mL via INTRAVENOUS

## 2017-10-24 ENCOUNTER — Encounter (INDEPENDENT_AMBULATORY_CARE_PROVIDER_SITE_OTHER): Payer: Self-pay | Admitting: Vascular Surgery

## 2017-10-24 ENCOUNTER — Ambulatory Visit (INDEPENDENT_AMBULATORY_CARE_PROVIDER_SITE_OTHER): Payer: Medicare Other | Admitting: Vascular Surgery

## 2017-10-24 VITALS — BP 134/77 | HR 76 | Resp 16 | Ht 62.0 in | Wt 179.4 lb

## 2017-10-24 DIAGNOSIS — I728 Aneurysm of other specified arteries: Secondary | ICD-10-CM | POA: Diagnosis not present

## 2017-10-24 DIAGNOSIS — I10 Essential (primary) hypertension: Secondary | ICD-10-CM | POA: Diagnosis not present

## 2017-10-24 DIAGNOSIS — E785 Hyperlipidemia, unspecified: Secondary | ICD-10-CM | POA: Diagnosis not present

## 2017-10-24 DIAGNOSIS — I712 Thoracic aortic aneurysm, without rupture, unspecified: Secondary | ICD-10-CM

## 2017-10-24 DIAGNOSIS — I7121 Aneurysm of the ascending aorta, without rupture: Secondary | ICD-10-CM | POA: Insufficient documentation

## 2017-10-24 NOTE — Assessment & Plan Note (Signed)
I have independently reviewed her CT scan.  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  This has not enlarged significantly since her last checked several years ago.  This poses her no immediate risk which is encouraging.  Blood pressure control and tobacco avoidance of paramount importance.  Recheck with CT scan in 1 year

## 2017-10-24 NOTE — Progress Notes (Signed)
MRN : [id number]  Carolyn Woods is a 77 y.o. (Oct 02, 1940) female who presents with chief complaint of  Chief Complaint  Patient presents with  . Follow-up    ct results results  .  History of Present Illness: Patient returns today in follow up of her innominate artery aneurysm.  She has some dysphasia although it is not overly painful but has not had major changes since her visit several weeks ago.  I have independently reviewed her CT scan.  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  This has not enlarged significantly since her last checked several years ago.  Current Outpatient Medications  Medication Sig Dispense Refill  . amLODipine (NORVASC) 2.5 MG tablet Take 2.5 mg by mouth.    Marland Kitchen aspirin EC 81 MG tablet Take 81 mg by mouth daily.    Marland Kitchen atenolol (TENORMIN) 25 MG tablet Take by mouth.    . estradiol (ESTRACE) 1 MG tablet Take 1 mg by mouth 2 (two) times a week.    . lubiprostone (AMITIZA) 8 MCG capsule Take 8 mcg by mouth 2 (two) times daily with a meal.    . NONFORMULARY OR COMPOUNDED ITEM See pharmacy note 120 each 2  . pantoprazole (PROTONIX) 40 MG tablet TAKE 1 TABLET BY MOUTH TWICE DAILY    . simvastatin (ZOCOR) 20 MG tablet Take 20 mg by mouth.    . Vitamin D, Ergocalciferol, (DRISDOL) 50000 units CAPS capsule TK ONE C PO ONCE A WEEK     No current facility-administered medications for this visit.     Past Medical History:  Diagnosis Date  . Anemia   . Arthritis   . Candidiasis of the genitals, female   . Cervical cancer [facility])    Hysterectomy.  . Cystitis   . Diverticulosis   . Duodenal adenoma   . Gastritis   . Hyperlipidemia   . Hypertension   . 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 Sciatica   . Tubular adenoma     Past Surgical History:  Procedure Laterality Date  . ABDOMINAL HYSTERECTOMY    . COLONOSCOPY  02/20/10  . ERCP    . ESOPHAGOGASTRODUODENOSCOPY (EGD) WITH PROPOFOL N/A  10/25/2014   Procedure: ESOPHAGOGASTRODUODENOSCOPY (EGD) WITH PROPOFOL;  Surgeon: Lollie Sails, MD;  Location: [facility] ENDOSCOPY;  Service: Endoscopy;  Laterality: N/A;  . ESOPHAGOGASTRODUODENOSCOPY (EGD) WITH PROPOFOL N/A 12/05/2015   Procedure: ESOPHAGOGASTRODUODENOSCOPY (EGD) WITH PROPOFOL;  Surgeon: Lollie Sails, MD;  Location: [facility] ENDOSCOPY;  Service: Endoscopy;  Laterality: N/A;    Family History  Problem Relation Age of Onset  . Breast cancer Neg Hx   No bleeding disorders, clotting disorders, autoimmune diseases, or aneurysms  Social History Social History        Tobacco Use  . Smoking status: Never Smoker  . Smokeless tobacco: Never Used  Substance Use Topics  . Alcohol use: No    Alcohol/week: 1.0 standard drinks    Types: 1 Glasses of wine per week  . Drug use: No     Allergies  Allergen Reactions  . Linzess [Linaclotide]   . Codeine Rash   REVIEW OF SYSTEMS (Negative unless checked)  Constitutional: [] Weight loss  [] Fever  [] Chills Cardiac: [] Chest pain   [] Chest pressure   [] Palpitations   [] Shortness of breath when laying flat   [] Shortness of breath at rest   [] Shortness of breath with exertion. Vascular:  [] Pain in legs with walking   []   Pain in legs at rest   [] Pain in legs when laying flat   [] Claudication   [] Pain in feet when walking  [] Pain in feet at rest  [] Pain in feet when laying flat   [] History of DVT   [] Phlebitis   [] Swelling in legs   [] Varicose veins   [] Non-healing ulcers Pulmonary:   [] Uses home oxygen   [] Productive cough   [] Hemoptysis   [] Wheeze  [] COPD   [] Asthma Neurologic:  [] Dizziness  [] Blackouts   [] Seizures   [] History of stroke   [] History of TIA  [] Aphasia   [] Temporary blindness   [] Dysphagia   [] Weakness or numbness in arms   [] Weakness or numbness in legs Musculoskeletal:  [] Arthritis   [] Joint swelling   [] Joint pain   [] Low back pain Hematologic:  [] Easy bruising  [] Easy bleeding   [] Hypercoagulable state    [] Anemic  [] Hepatitis Gastrointestinal:  [] Blood in stool   [] Vomiting blood  [] Gastroesophageal reflux/heartburn   [] Abdominal pain Genitourinary:  [] Chronic kidney disease   [] Difficult urination  [] Frequent urination  [] Burning with urination   [] Hematuria Skin:  [] Rashes   [] Ulcers   [] Wounds Psychological:  [] History of anxiety   []  History of major depression.  Physical Examination  BP 134/77 (BP Location: Right Arm)   Pulse 76   Resp 16   Ht 5\' 2"  (1.575 m)   Wt 179 lb 6.4 oz (81.4 kg)   BMI 32.81 kg/m  Gen:  WD/WN, NAD Head: [place]/AT, No temporalis wasting. Ear/Nose/Throat: Hearing grossly intact, nares w/o erythema or drainage Eyes: Conjunctiva clear. Sclera non-icteric Neck: Supple. Increased impulse of right carotid artery, visibly pulsatile Pulmonary:  Good air movement, no use of accessory muscles.  Cardiac: RRR, no JVD Vascular:  Vessel Right Left  Radial Palpable Palpable                                   Musculoskeletal: M/S 5/5 throughout.  No deformity or atrophy. Mild LE edema. Neurologic: Sensation grossly intact in extremities.  Symmetrical.  Speech is fluent.  Psychiatric: Judgment intact, Mood & affect appropriate for pt's clinical situation. Dermatologic: No rashes or ulcers noted.  No cellulitis or open wounds.       Labs No results found for this or any previous visit (from the past 2160 hour(s)).  Radiology Ct Angio Neck W Or Wo Contrast  Result Date: 10/09/2017 CLINICAL DATA:  Innominate and right subclavian artery aneurysms. EXAM: CT ANGIOGRAPHY NECK TECHNIQUE: Multidetector CT imaging of the neck was performed using the standard protocol during bolus administration of intravenous contrast. Multiplanar CT image reconstructions and MIPs were obtained to evaluate the vascular anatomy. Carotid stenosis measurements (when applicable) are obtained utilizing NASCET criteria, using the distal internal carotid diameter as the denominator. CONTRAST:   66mL ISOVUE-370 IOPAMIDOL (ISOVUE-370) INJECTION 76% COMPARISON:  09/24/2011 neck CTA. Carotid Doppler ultrasound report 09/18/2016. FINDINGS: Aortic arch: Standard 3 vessel aortic arch. Partially visualized aneurysmal dilatation of the distal ascending and proximal descending thoracic aorta with diameters of 4.0 cm and 3.8 cm, respectively. There is a generalized ectatic appearance of the innominate and proximal right subclavian arteries which each measure up to 1.6 cm in maximal diameter, not significantly changed from 2013. No saccular aneurysm is present. Right carotid system: Patent with minimal calcified plaque at the carotid bifurcation. No evidence of stenosis or dissection. Tortuous proximal common carotid artery. Left carotid system: Patent with mild calcified plaque at  the carotid bifurcation. No evidence of significant stenosis or dissection. Tortuous proximal common carotid artery. Vertebral arteries: Patent and codominant without evidence of stenosis or dissection. Minimal calcified plaque at the right vertebral artery origin. Skeleton: Mild-to-moderate cervical disc and facet degeneration. No suspicious osseous lesion. Other neck: No evidence of acute abnormality. No lymph node enlargement. Upper chest: Clear lung apices. IMPRESSION: 1. Unchanged ectasia of the innominate and proximal right subclavian arteries with maximal diameter 1.6 cm. 2. Partially visualized aneurysmal dilatation of the ascending thoracic aorta, 4.0 cm diameter. Recommend annual imaging followup by CTA or MRA. This recommendation follows 2010 ACCF/AHA/AATS/ACR/ASA/SCA/SCAI/SIR/STS/SVM Guidelines for the Diagnosis and Management of Patients with Thoracic Aortic Disease. Circulation. 2010; 121: [id number] 3. Mild carotid artery atherosclerosis without stenosis. Electronically Signed   By: Logan Bores M.D.   On: 10/09/2017 13:26    Assessment/Plan Hypertension blood pressure control important in reducing the progression of  atherosclerotic disease and aneurysmal degeneration. On appropriate oral medications.   Hyperlipidemia lipid control important in reducing the progression of atherosclerotic disease. Continue statin therapy  Ascending aortic aneurysm (HCC) 4.0 cm on CT. Small. No need for intervention at this point.  Check CT in one year.  Subclavian aneurysm ([place]) I have independently reviewed her CT scan.  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  This has not enlarged significantly since her last checked several years ago.  This poses her no immediate risk which is encouraging.  Blood pressure control and tobacco avoidance of paramount importance.  Recheck with CT scan in 1 year    Leotis Pain, MD  10/24/2017 9:50 AM    This note was created with Dragon medical transcription system.  Any errors from dictation are purely unintentional

## 2017-10-24 NOTE — Assessment & Plan Note (Signed)
4.0 cm on CT. Small. No need for intervention at this point.  Check CT in one year.

## 2018-03-02 ENCOUNTER — Other Ambulatory Visit: Payer: Self-pay | Admitting: Family Medicine

## 2018-03-02 DIAGNOSIS — Z1231 Encounter for screening mammogram for malignant neoplasm of breast: Secondary | ICD-10-CM

## 2018-07-22 ENCOUNTER — Other Ambulatory Visit: Payer: Self-pay

## 2018-07-22 ENCOUNTER — Ambulatory Visit
Admission: RE | Admit: 2018-07-22 | Discharge: 2018-07-22 | Disposition: A | Discharge status: Home / Self Care | Payer: Medicare Other | Source: Ambulatory Visit | Attending: Family Medicine | Admitting: Family Medicine

## 2018-07-22 DIAGNOSIS — Z1231 Encounter for screening mammogram for malignant neoplasm of breast: Secondary | ICD-10-CM | POA: Diagnosis not present

## 2018-07-22 IMAGING — MG DIGITAL SCREENING BILATERAL MAMMOGRAM WITH TOMO AND CAD
6 of 10 series · 6 of 30 positions shown · non-contrast
Comparison: Previous exam(s).

ACR Breast Density Category a: The breast tissue is almost entirely
fatty.

CLINICAL DATA: Screening.

EXAM:
DIGITAL SCREENING BILATERAL MAMMOGRAM WITH TOMO AND CAD

[L MLO synth-2D]
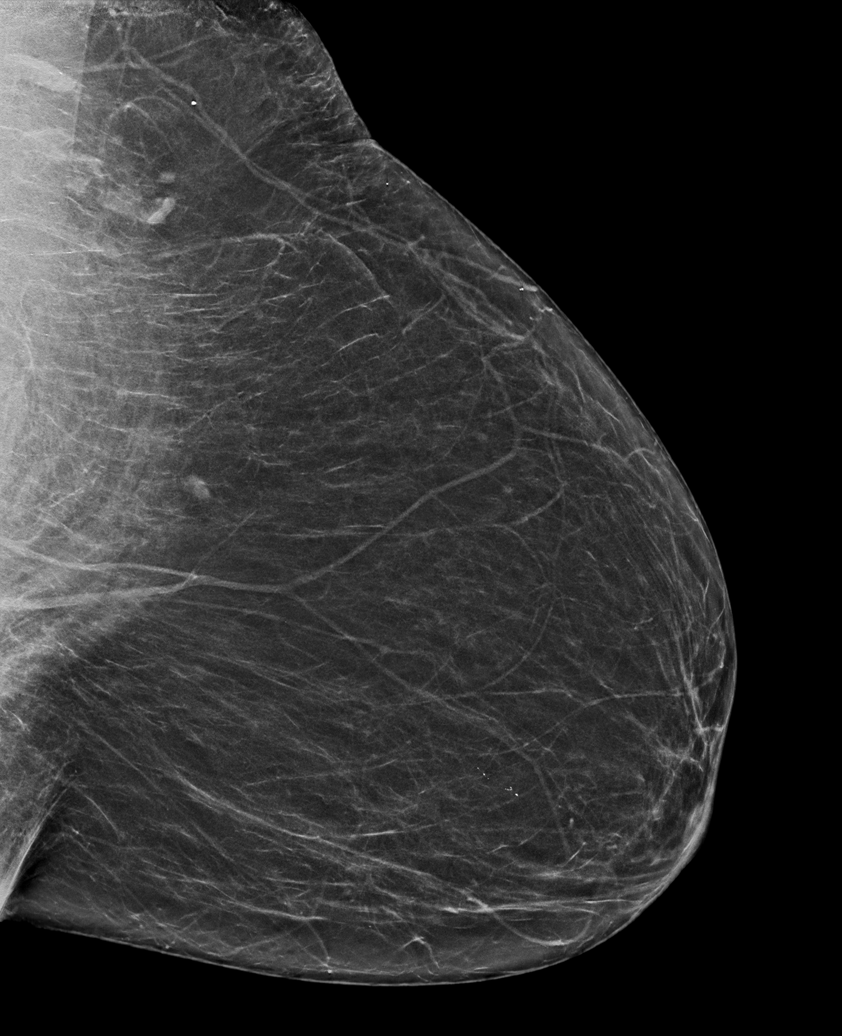

[R MLO synth-2D (1 of 2)]
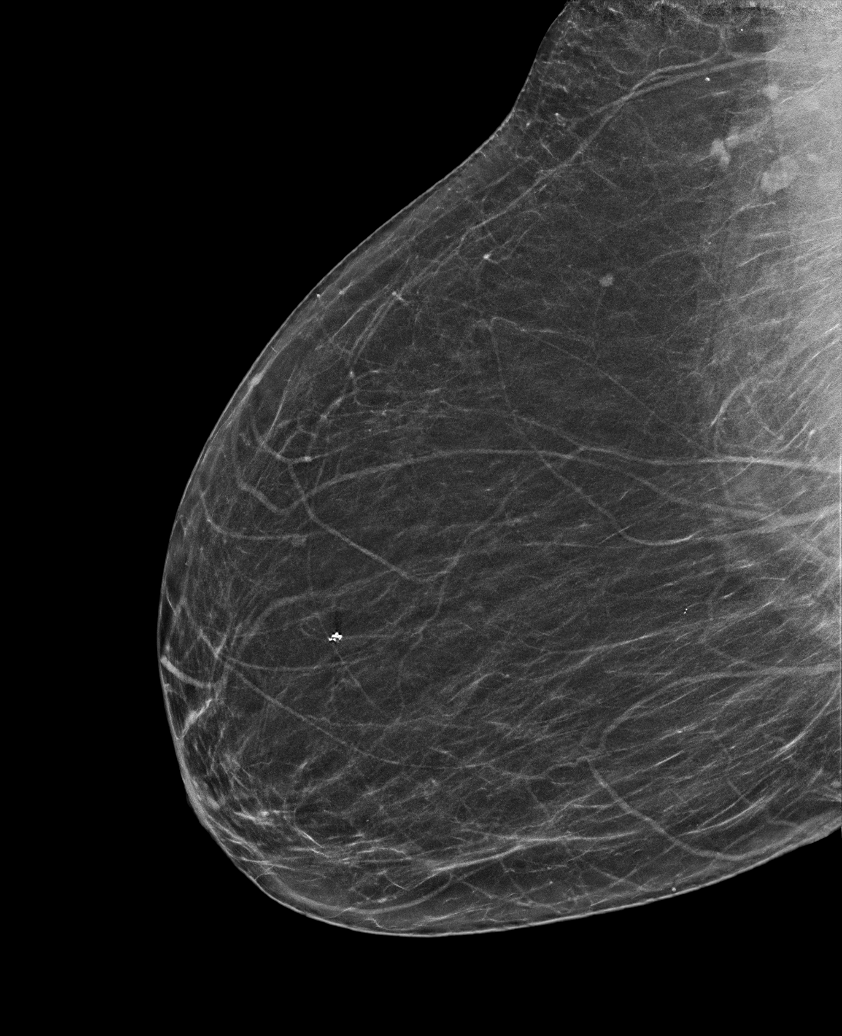

[L CC synth-2D]
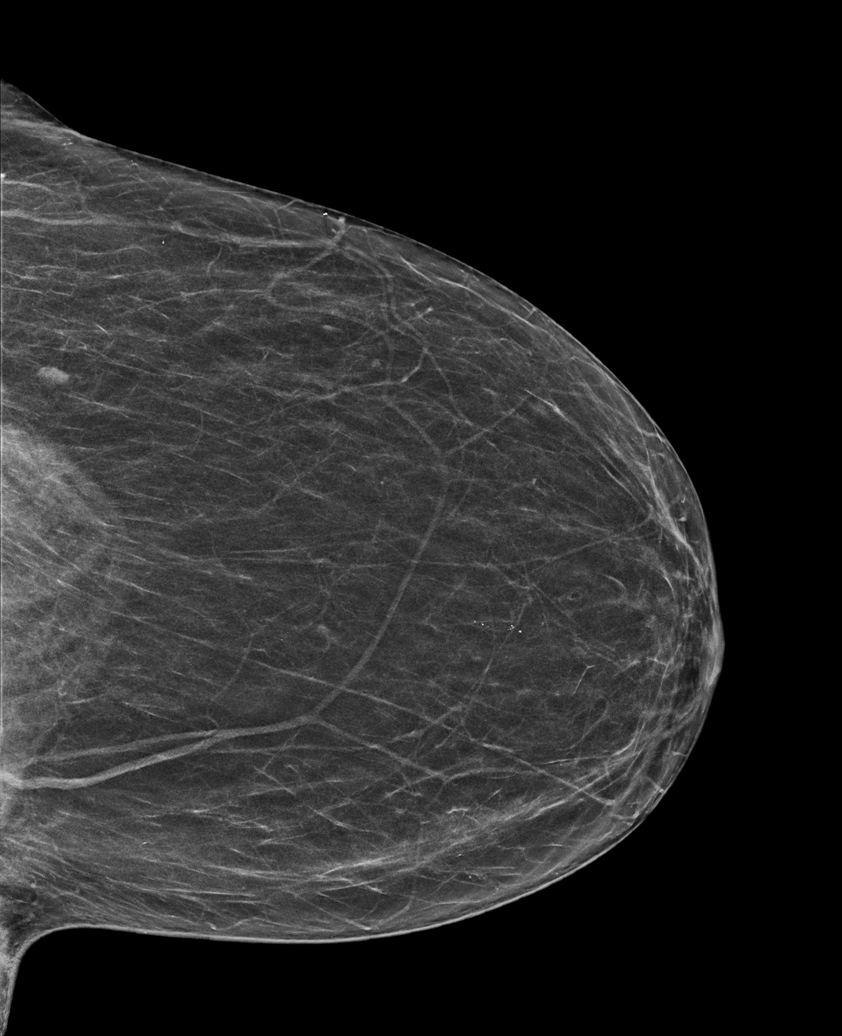

[R MLO synth-2D (2 of 2)]
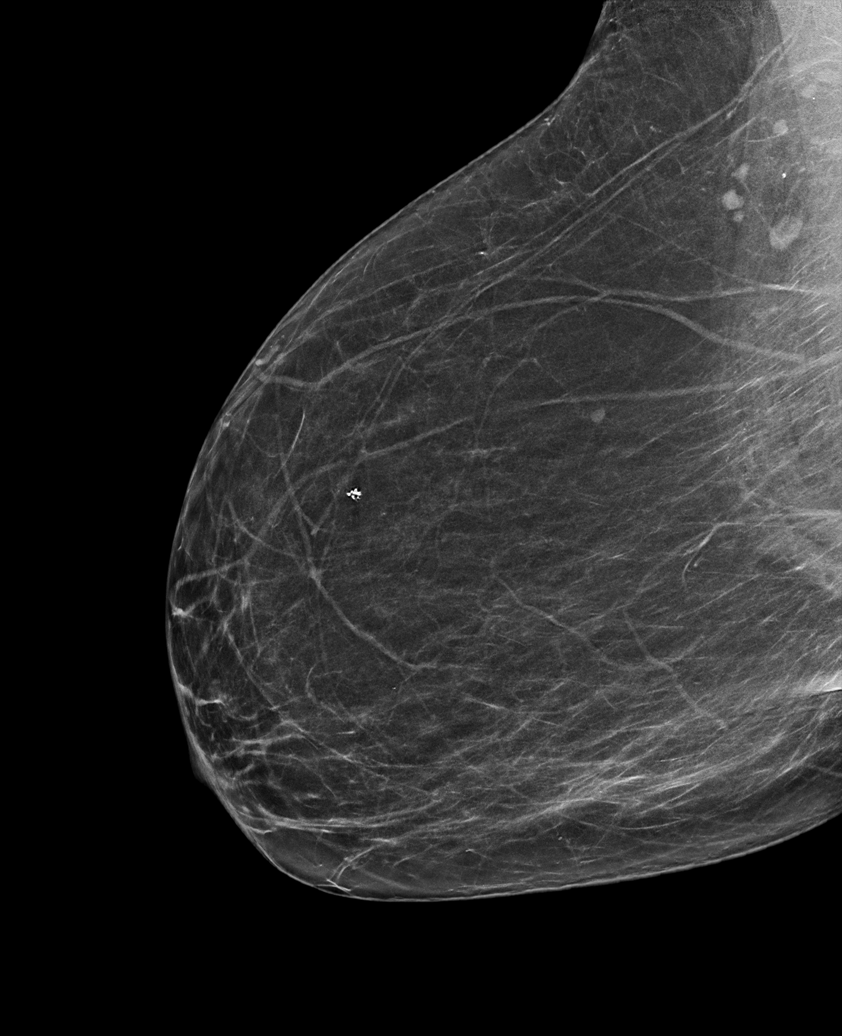

[R CC synth-2D]
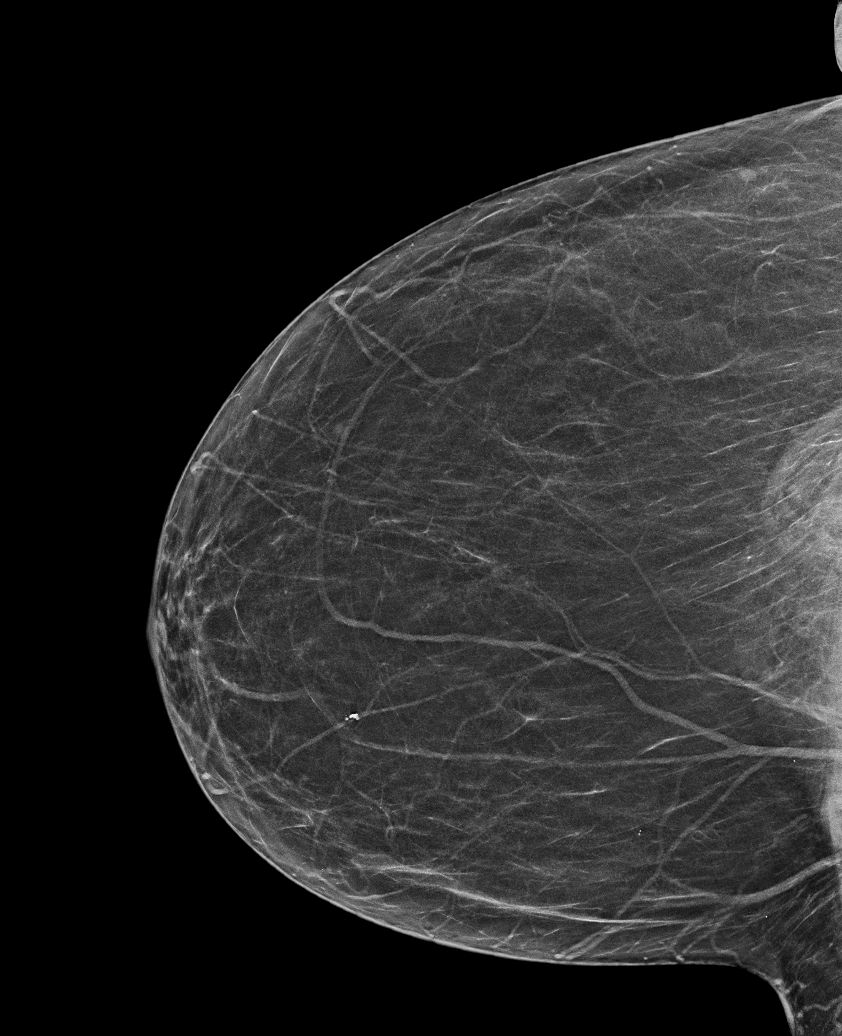

[L MLO tomo · tomo slice 31/61.0]
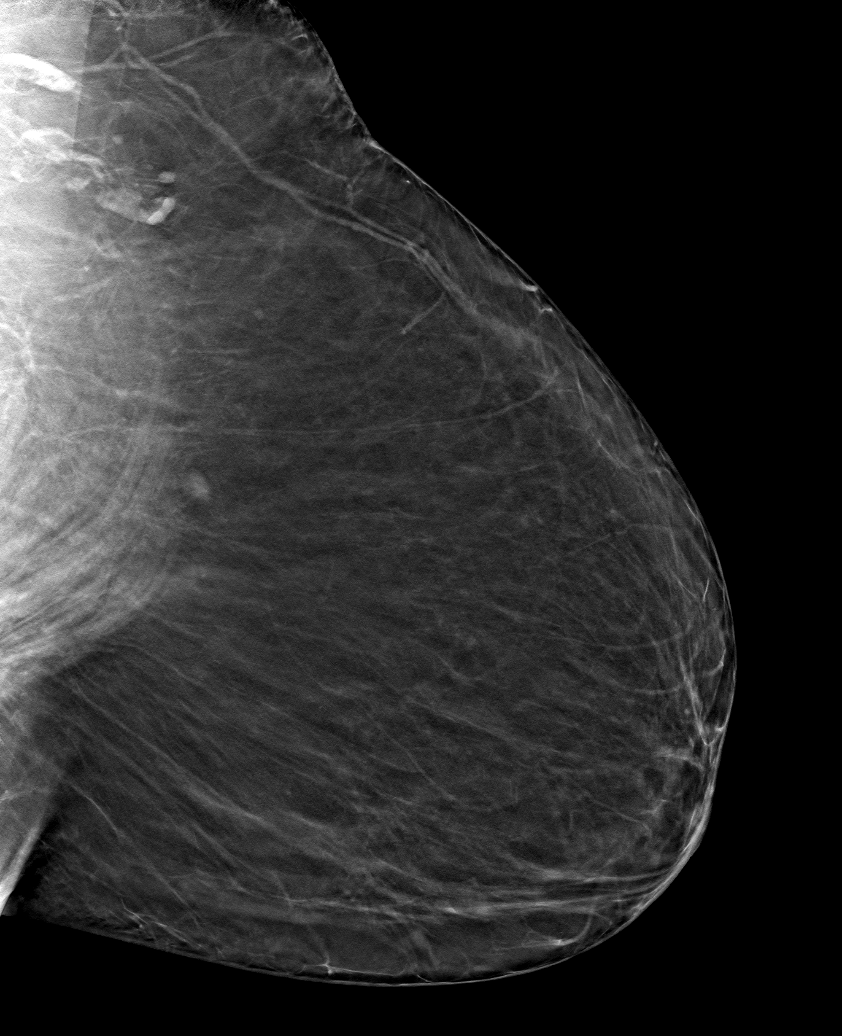

[6 of 30 positions shown; findings below may reference images not displayed]

FINDINGS: There are no findings suspicious for malignancy. Images were
processed with CAD.
IMPRESSION: No mammographic evidence of malignancy. A result letter of this
screening mammogram will be mailed directly to the patient.

RECOMMENDATION:
Screening mammogram in one year. (Code:[TA])

BI-RADS CATEGORY  1: Negative.

## 2018-10-08 ENCOUNTER — Other Ambulatory Visit: Payer: Self-pay

## 2018-10-08 DIAGNOSIS — Z20822 Contact with and (suspected) exposure to covid-19: Secondary | ICD-10-CM

## 2018-10-09 LAB — NOVEL CORONAVIRUS, NAA: SARS-CoV-2, NAA: NOT DETECTED

## 2018-10-19 ENCOUNTER — Telehealth (INDEPENDENT_AMBULATORY_CARE_PROVIDER_SITE_OTHER): Payer: Self-pay | Admitting: Vascular Surgery

## 2018-10-19 NOTE — Telephone Encounter (Signed)
Please advise. AS, CMA 

## 2018-10-19 NOTE — Telephone Encounter (Signed)
She should have her CT done before she comes so that it can be reviewed.  It looks like it has been ordered, so it may just need to be scheduled.

## 2018-10-19 NOTE — Telephone Encounter (Signed)
Patient was given the number to schedule CT [phone] [id number]. AS, CMA

## 2018-10-29 ENCOUNTER — Other Ambulatory Visit: Payer: Self-pay

## 2018-10-29 ENCOUNTER — Ambulatory Visit
Admission: RE | Admit: 2018-10-29 | Discharge: 2018-10-29 | Disposition: A | Discharge status: Home / Self Care | Payer: Medicare Other | Source: Ambulatory Visit | Attending: Vascular Surgery | Admitting: Vascular Surgery

## 2018-10-29 DIAGNOSIS — I712 Thoracic aortic aneurysm, without rupture, unspecified: Secondary | ICD-10-CM

## 2018-10-29 LAB — POCT I-STAT CREATININE: Creatinine, Ser: 1.1 mg/dL — ABNORMAL HIGH (ref 0.44–1.00)

## 2018-10-29 IMAGING — CT CT ANGIO CHEST
2 of 6 series · 13 of 36 positions shown · IV contrast (omnipaque)
Comparison: [DATE], prior neck CT [DATE]

CLINICAL DATA: 78-year-old female with known thoracic aortic
aneurysm

EXAM:
CT ANGIOGRAPHY CHEST WITH CONTRAST
TECHNIQUE: Multidetector CT imaging of the chest was performed using the
standard protocol during bolus administration of intravenous
contrast. Multiplanar CT image reconstructions and MIPs were
obtained to evaluate the vascular anatomy.
CONTRAST:  75mL OMNIPAQUE IOHEXOL 350 MG/ML SOLN

[Series 5: axial arterial cta thorax · axial · arterial · 0.59mm/px · z∈[-1139,-913]mm · 12 of 135 slices shown]
[im 11/135  lung]
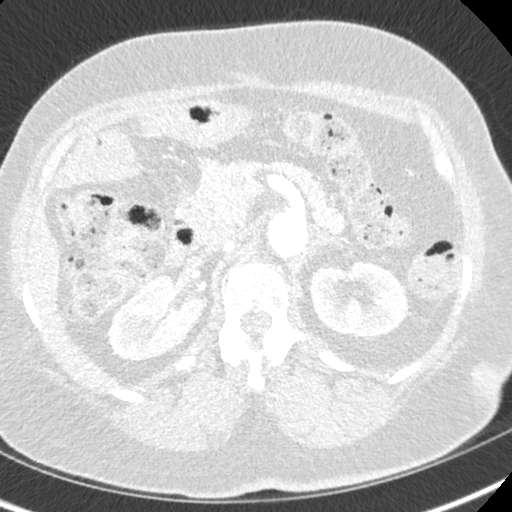
[im 21/135  mediastinal]
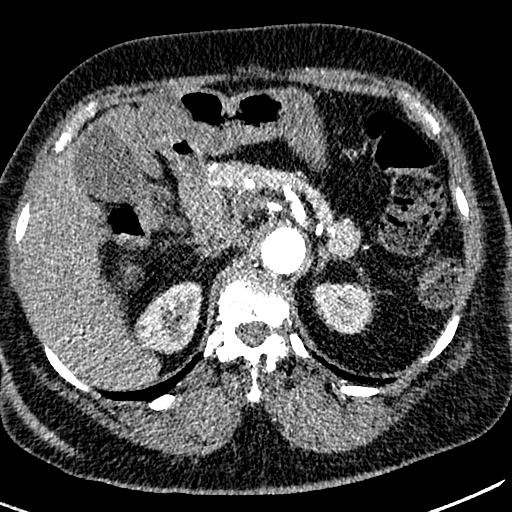
[im 31/135  lung]
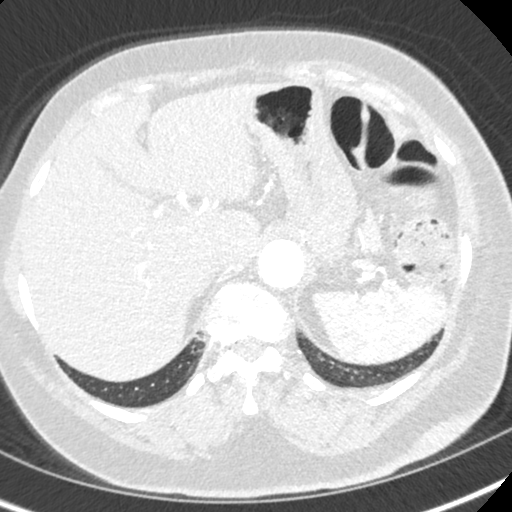
[im 42/135  mediastinal]
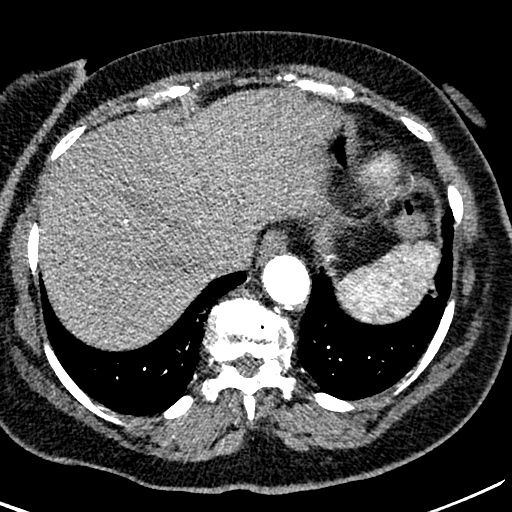
[im 52/135  lung]
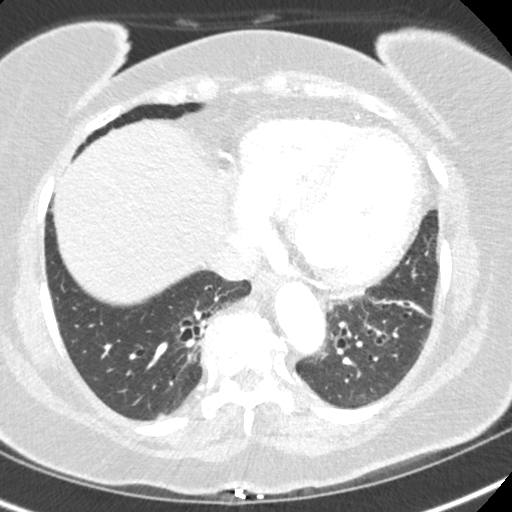
[im 62/135  mediastinal]
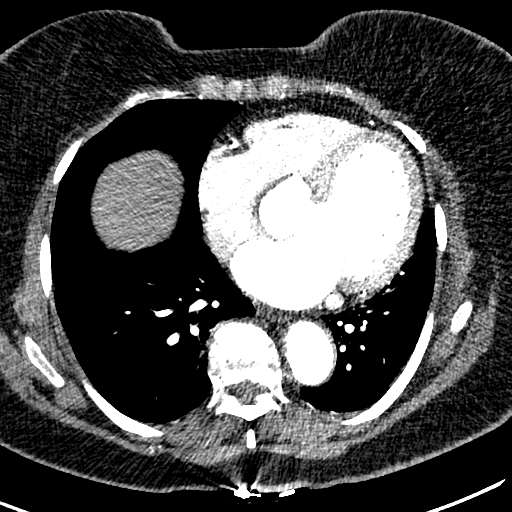
[im 73/135  lung]
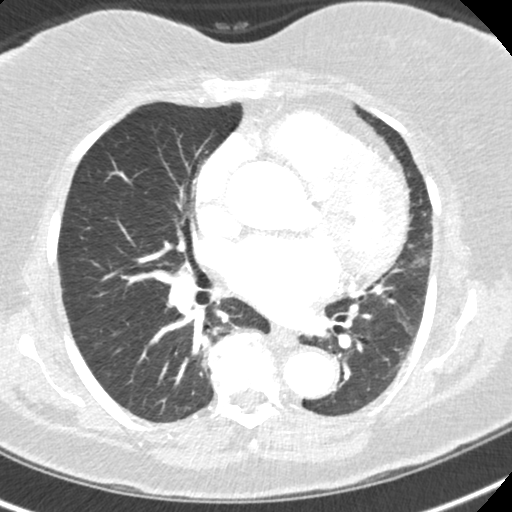
[im 83/135  mediastinal]
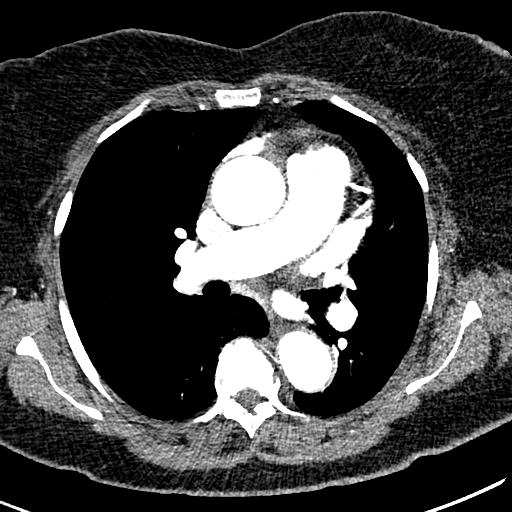
[im 93/135  lung]
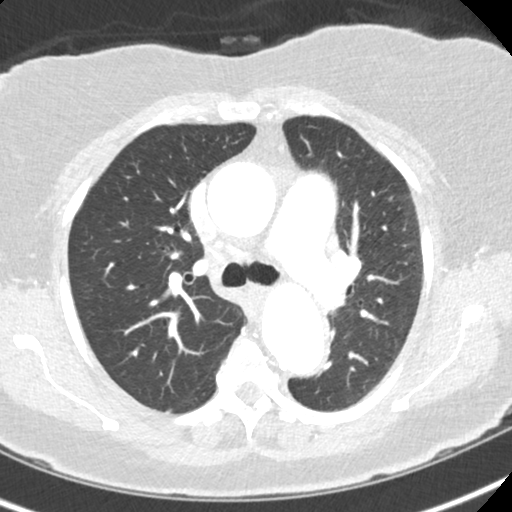
[im 104/135  mediastinal]
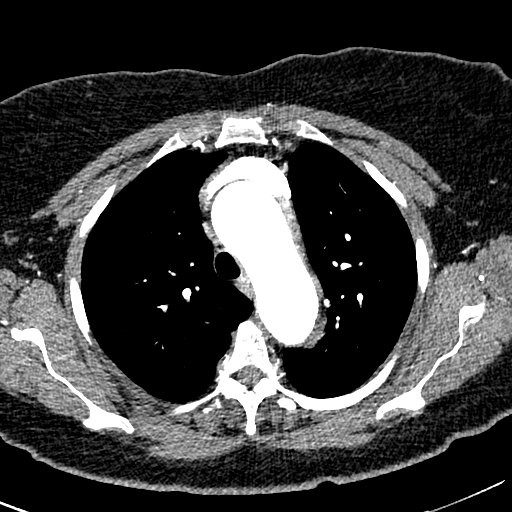
[im 114/135  lung]
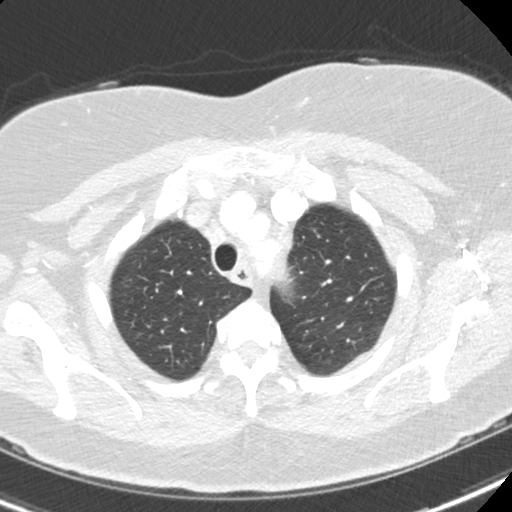
[im 124/135  mediastinal]
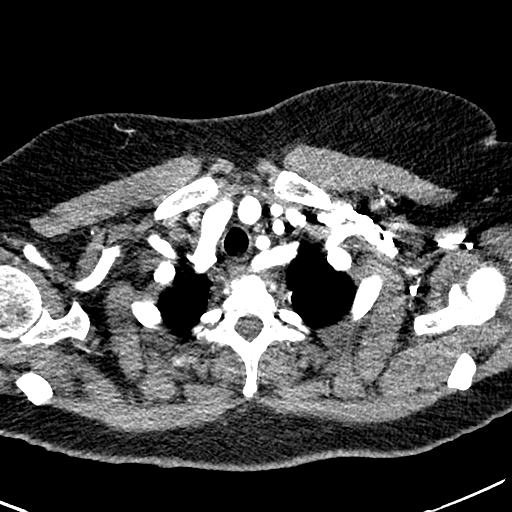

[Series 8: cor st cta thorax · coronal · 0.53mm/px · 1 of 148 slices shown]
[im 74/148  mediastinal]
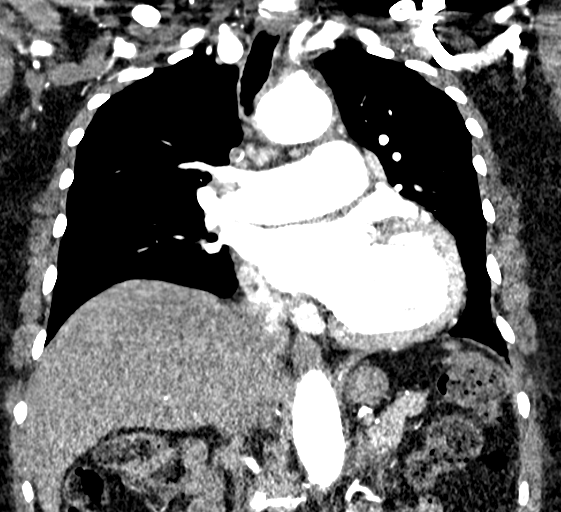

[13 of 36 positions shown; findings below may reference images not displayed]

FINDINGS: Cardiovascular:

Heart:

Cardiomegaly. No pericardial fluid/thickening. No significant
coronary calcifications.

Aorta:

No significant aortic valve calcifications. Best estimate of the
aortic valve annulus is 2.1 cm.

Estimated greatest diameter of the ascending aorta transverse to the
flow channel 4.3 cm.

Three vessel aortic arch with patency of the branch vessels.

Mild atherosclerosis of the descending thoracic aorta. No dissection
or periaortic fluid.

Distal aortic arch measures 3.7 cm maximum diameter.

Diameter at the aortic hiatus 2.4 cm.

Pulmonary arteries:

The bolus of contrast is not timed for the most sensitive evaluation
of the pulmonary arteries for filling defects, however, no proximal
filling defects are suspected. Greatest diameter of the main
pulmonary artery 3.7 cm. No significant enlargement of the distal
pulmonary arteries.

Mediastinum/Nodes: Small lymph nodes of the mediastinum.
Unremarkable appearance of the thoracic esophagus.

Unremarkable thoracic inlet

Lungs/Pleura: Central airways are clear. No pleural effusion. No
confluent airspace disease.

Bronchiectasis of the lower lobes with some architectural distortion
and subpleural reticulation medially on the right. No confluent
airspace disease.

Upper Abdomen: No acute.

Musculoskeletal: Accentuated kyphotic curvature of the thoracic
spine. Degenerative changes of the thoracic spine including advanced
sclerotic changes of T10-T11 with vacuum disc phenomenon. No bony
canal narrowing. No acute displaced fracture.

Review of the MIP images confirms the above findings.
IMPRESSION: Greatest diameter of the ascending aorta is estimated 4.3 cm.
Recommend annual imaging followup by CTA or MRA. This recommendation
follows [2R] ACCF/AHA/AATS/ACR/ASA/SCA/OZUNA/OZUNA/OZUNA/OZUNA Guidelines
for the Diagnosis and Management of Patients with Thoracic Aortic
Disease. Circulation. [2R]; 121: [id number]. Aortic aneurysm NOS
([2R]-[2R])

Aortic Atherosclerosis ([2R]-[2R]).

Cardiomegaly.

Mild bronchiectasis and scarring of the lower lobes.

## 2018-10-29 MED ORDER — IOHEXOL 350 MG/ML SOLN
75.0000 mL | Freq: Once | INTRAVENOUS | Status: AC | PRN
  Administered 2018-10-29: 75 mL via INTRAVENOUS

## 2018-11-03 ENCOUNTER — Encounter (INDEPENDENT_AMBULATORY_CARE_PROVIDER_SITE_OTHER): Payer: Self-pay

## 2018-11-03 ENCOUNTER — Ambulatory Visit (INDEPENDENT_AMBULATORY_CARE_PROVIDER_SITE_OTHER): Payer: Medicare Other | Admitting: Vascular Surgery

## 2018-11-03 ENCOUNTER — Encounter (INDEPENDENT_AMBULATORY_CARE_PROVIDER_SITE_OTHER): Payer: Self-pay | Admitting: Vascular Surgery

## 2018-11-03 ENCOUNTER — Other Ambulatory Visit: Payer: Self-pay

## 2018-11-03 VITALS — BP 159/89 | HR 77 | Resp 16 | Ht 62.0 in | Wt 177.0 lb

## 2018-11-03 DIAGNOSIS — I712 Thoracic aortic aneurysm, without rupture, unspecified: Secondary | ICD-10-CM

## 2018-11-03 DIAGNOSIS — I10 Essential (primary) hypertension: Secondary | ICD-10-CM

## 2018-11-03 DIAGNOSIS — I728 Aneurysm of other specified arteries: Secondary | ICD-10-CM | POA: Diagnosis not present

## 2018-11-03 DIAGNOSIS — Z20822 Contact with and (suspected) exposure to covid-19: Secondary | ICD-10-CM

## 2018-11-03 DIAGNOSIS — E785 Hyperlipidemia, unspecified: Secondary | ICD-10-CM | POA: Diagnosis not present

## 2018-11-03 DIAGNOSIS — I7121 Aneurysm of the ascending aorta, without rupture: Secondary | ICD-10-CM

## 2018-11-03 NOTE — Assessment & Plan Note (Signed)
She has undergone a CT angiogram of the chest which I have independently reviewed.  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

## 2018-11-03 NOTE — Assessment & Plan Note (Signed)
She has undergone a CT angiogram of the chest which I have independently reviewed.  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 Minimal change.  Blood pressure control is the most important factor for reducing progression of the aneurysmal degeneration.  Continue to check on an annual basis with CT scan for both the innominate artery and the ascending thoracic aorta.

## 2018-11-03 NOTE — Progress Notes (Signed)
MRN : [id number]  Carolyn Woods is a 78 y.o. (12/30/1940) female who presents with chief complaint of  Chief Complaint  Patient presents with   Follow-up    yearly f/u  .  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  She is doing well today without any specific complaints.  She denies any aneurysm related symptoms such as back or chest pain or signs of peripheral embolization or stroke. She has undergone a CT angiogram of the chest which I have independently reviewed.  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  Current Outpatient Medications  Medication Sig Dispense Refill   amLODipine (NORVASC) 2.5 MG tablet Take 2.5 mg by mouth.     aspirin EC 81 MG tablet Take 81 mg by mouth daily.     atenolol (TENORMIN) 25 MG tablet Take by mouth.     conjugated estrogens (PREMARIN) vaginal cream Place vaginally.     estradiol (ESTRACE) 1 MG tablet Take 1 mg by mouth 2 (two) times a week.     lubiprostone (AMITIZA) 8 MCG capsule Take 8 mcg by mouth 2 (two) times daily with a meal.     NONFORMULARY OR COMPOUNDED ITEM See pharmacy note 120 each 2   pantoprazole (PROTONIX) 40 MG tablet TAKE 1 TABLET BY MOUTH TWICE DAILY     simvastatin (ZOCOR) 20 MG tablet Take 20 mg by mouth.     Vitamin D, Ergocalciferol, (DRISDOL) 50000 units CAPS capsule TK ONE C PO ONCE A WEEK     No current facility-administered medications for this visit.     Past Medical History:  Diagnosis Date   Anemia    Arthritis    Candidiasis of the genitals, female    Cervical cancer [facility])    Hysterectomy.   Cystitis    Diverticulosis    Duodenal adenoma    Gastritis     Hyperlipidemia    Hypertension    Incontinence overflow, stress female    Sciatica    Tubular adenoma     Past Surgical History:  Procedure Laterality Date   ABDOMINAL HYSTERECTOMY     COLONOSCOPY  02/20/10   ERCP     ESOPHAGOGASTRODUODENOSCOPY (EGD) WITH PROPOFOL N/A 10/25/2014   Procedure: ESOPHAGOGASTRODUODENOSCOPY (EGD) WITH PROPOFOL;  Surgeon: Lollie Sails, MD;  Location[facility] ENDOSCOPY;  Service: Endoscopy;  Laterality: N/A;   ESOPHAGOGASTRODUODENOSCOPY (EGD) WITH PROPOFOL N/A 12/05/2015   Procedure: ESOPHAGOGASTRODUODENOSCOPY (EGD) WITH PROPOFOL;  Surgeon: Lollie Sails, MD;  Location: [facility] ENDOSCOPY;  Service: Endoscopy;  Laterality: N/A;   Family History  Problem Relation Age of Onset   Breast cancer Neg Hx   No bleeding disorders, clotting disorders, autoimmune diseases, or aneurysms  Social History Social History        Tobacco Use   Smoking status: Never Smoker   Smokeless tobacco: Never Used  Substance Use Topics   Alcohol use: No    Alcohol/week: 1.0 standard drinks    Types: 1 Glasses of wine per week   Drug use: No     Allergies  Allergen Reactions   Linzess [Linaclotide]    Codeine Rash   REVIEW OF SYSTEMS(Negative unless checked)  Constitutional: [] ?Weight loss[] ?Fever[] ?Chills Cardiac:[] ?Chest pain[] ?Chest  pressure[] ?Palpitations [] ?Shortness of breath when laying flat [] ?Shortness of breath at rest [] ?Shortness of breath with exertion. Vascular: [] ?Pain in legs with walking[] ?Pain in legsat rest[] ?Pain in legs when laying flat [] ?Claudication [] ?Pain in feet when walking [] ?Pain in feet at rest [] ?Pain in feet when laying flat [] ?History of DVT [] ?Phlebitis [] ?Swelling in legs [] ?Varicose veins [] ?Non-healing ulcers Pulmonary: [] ?Uses home oxygen [] ?Productive cough[] ?Hemoptysis [] ?Wheeze [] ?COPD [] ?Asthma Neurologic: [] ?Dizziness [] ?Blackouts  [] ?Seizures [] ?History of stroke [] ?History of TIA[] ?Aphasia [] ?Temporary blindness[] ?Dysphagia [] ?Weaknessor numbness in arms [] ?Weakness or numbnessin legs Musculoskeletal: [] ?Arthritis [] ?Joint swelling [] ?Joint pain [] ?Low back pain Hematologic:[] ?Easy bruising[] ?Easy bleeding [] ?Hypercoagulable state [] ?Anemic [] ?Hepatitis Gastrointestinal:[] ?Blood in stool[] ?Vomiting blood[] ?Gastroesophageal reflux/heartburn[] ?Abdominal pain Genitourinary: [] ?Chronic kidney disease [] ?Difficulturination [] ?Frequenturination [] ?Burning with urination[] ?Hematuria Skin: [] ?Rashes [] ?Ulcers [] ?Wounds Psychological: [] ?History of anxiety[] ?History of major depression.    Physical Examination  BP (!) 159/89 (BP Location: Left Arm)    Pulse 77    Resp 16    Ht 5\' 2"  (1.575 m)    Wt 177 lb (80.3 kg)    BMI 32.37 kg/m  Gen:  WD/WN, NAD Head: [place]/AT, No temporalis wasting. Ear/Nose/Throat: Hearing grossly intact, nares w/o erythema or drainage Eyes: Conjunctiva clear. Sclera non-icteric Neck: Supple.  Trachea midline Pulmonary:  Good air movement, no use of accessory muscles.  Cardiac: RRR, no JVD Vascular:  Vessel Right Left  Radial Palpable Palpable                   Musculoskeletal: M/S 5/5 throughout.  No deformity or atrophy.  Neurologic: Sensation grossly intact in extremities.  Symmetrical.  Speech is fluent.  Psychiatric: Judgment intact, Mood & affect appropriate for pt's clinical situation. Dermatologic: No rashes or ulcers noted.  No cellulitis or open wounds.       Labs Recent Results (from the past 2160 hour(s))  Novel Coronavirus, NAA ([facility])     Status: None   Collection Time: 10/08/18 12:00 AM   Specimen: Oropharyngeal(OP) collection in vial transport medium   OROPHARYNGEA  TESTING  Result Value Ref Range   SARS-CoV-2, NAA Not Detected Not Detected    Comment: This nucleic acid amplification test was  developed and its performance characteristics determined by [employer]. Nucleic acid amplification tests include PCR and TMA. This test has not been FDA cleared or approved. This test has been authorized by FDA under an Emergency Use Authorization (EUA). This test is only authorized for the duration of time the declaration that circumstances exist justifying the authorization of the emergency use of in vitro diagnostic tests for detection of SARS-CoV-2 virus and/or diagnosis of COVID-19 infection under section 564(b)(1) of the Act, 21 U.S.C. [id number]) (1), unless the authorization is terminated or revoked sooner. When diagnostic testing is negative, the possibility of a false negative result should be considered in the context of a patient's recent exposures and the presence of clinical signs and symptoms consistent with COVID-19. An individual without symptoms of COVID-19 and who is not shedding SARS-CoV-2 virus would  expect to have a negative (not detected) result in this assay.   I-STAT creatinine     Status: Abnormal   Collection Time: 10/29/18  8:46 AM  Result Value Ref Range   Creatinine, Ser 1.10 (H) 0.44 - 1.00 mg/dL    Radiology Ct Angio Chest Aorta W/cm &/or Wo/cm  Result Date: 10/29/2018 CLINICAL DATA:  78 year old female with known thoracic aortic aneurysm EXAM: CT ANGIOGRAPHY CHEST WITH CONTRAST TECHNIQUE: Multidetector CT imaging of the chest was performed using the standard protocol during bolus administration of intravenous contrast.  Multiplanar CT image reconstructions and MIPs were obtained to evaluate the vascular anatomy. CONTRAST:  6mL OMNIPAQUE IOHEXOL 350 MG/ML SOLN COMPARISON:  09/24/2011, prior neck CT 10/09/2017 FINDINGS: Cardiovascular: Heart: Cardiomegaly. No pericardial fluid/thickening. No significant coronary calcifications. Aorta: No significant aortic valve calcifications. Best estimate of the aortic valve annulus is 2.1 cm. Estimated  greatest diameter of the ascending aorta transverse to the flow channel 4.3 cm. Three vessel aortic arch with patency of the branch vessels. Mild atherosclerosis of the descending thoracic aorta. No dissection or periaortic fluid. Distal aortic arch measures 3.7 cm maximum diameter. Diameter at the aortic hiatus 2.4 cm. Pulmonary arteries: The bolus of contrast is not timed for the most sensitive evaluation of the pulmonary arteries for filling defects, however, no proximal filling defects are suspected. Greatest diameter of the main pulmonary artery 3.7 cm. No significant enlargement of the distal pulmonary arteries. Mediastinum/Nodes: Small lymph nodes of the mediastinum. Unremarkable appearance of the thoracic esophagus. Unremarkable thoracic inlet Lungs/Pleura: Central airways are clear. No pleural effusion. No confluent airspace disease. Bronchiectasis of the lower lobes with some architectural distortion and subpleural reticulation medially on the right. No confluent airspace disease. Upper Abdomen: No acute. Musculoskeletal: Accentuated kyphotic curvature of the thoracic spine. Degenerative changes of the thoracic spine including advanced sclerotic changes of T10-T11 with vacuum disc phenomenon. No bony canal narrowing. No acute displaced fracture. Review of the MIP images confirms the above findings. IMPRESSION: Greatest diameter of the ascending aorta is estimated 4.3 cm. Recommend annual imaging followup by CTA or MRA. This recommendation follows 2010 ACCF/AHA/AATS/ACR/ASA/SCA/SCAI/SIR/STS/SVM Guidelines for the Diagnosis and Management of Patients with Thoracic Aortic Disease. Circulation. 2010; [id number]. Aortic aneurysm NOS (ICD10-I71.9) Aortic Atherosclerosis (ICD10-I70.0). Cardiomegaly. Mild bronchiectasis and scarring of the lower lobes. Signed, Dulcy Fanny. Dellia Nims, RPVI [facility] Electronically Signed   By: Corrie Mckusick D.O.   On:  10/29/2018 11:01    Assessment/Plan Hypertension blood pressure control important in reducing the progression of atherosclerotic diseaseand aneurysmal degeneration. On appropriate oral medications.   Hyperlipidemia lipid control important in reducing the progression of atherosclerotic disease. 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  Ascending aortic aneurysm [facility]) She has undergone a CT angiogram of the chest which I have independently reviewed.  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 Minimal change.  Blood pressure control is the most important factor for reducing progression of the aneurysmal degeneration.  Continue to check on an annual basis with CT scan for both the innominate artery and the ascending thoracic aorta.    Leotis Pain, MD  11/03/2018 11:28 AM    This note was created with Dragon medical transcription system.  Any errors from dictation are purely unintentional

## 2018-11-05 LAB — NOVEL CORONAVIRUS, NAA: SARS-CoV-2, NAA: NOT DETECTED

## 2018-11-06 ENCOUNTER — Ambulatory Visit (INDEPENDENT_AMBULATORY_CARE_PROVIDER_SITE_OTHER): Payer: Medicare Other | Admitting: Vascular Surgery

## 2019-01-26 ENCOUNTER — Ambulatory Visit (INDEPENDENT_AMBULATORY_CARE_PROVIDER_SITE_OTHER): Payer: Medicare Other | Admitting: Vascular Surgery

## 2019-04-19 ENCOUNTER — Ambulatory Visit: Payer: Medicare Other | Attending: Internal Medicine

## 2019-04-19 DIAGNOSIS — Z23 Encounter for immunization: Secondary | ICD-10-CM

## 2019-04-19 NOTE — Progress Notes (Signed)
   Covid-19 Vaccination Clinic  Name:  Carolyn Woods    MRN: [id number] DOB: 07-15-1940  04/19/2019  [name] was observed post Covid-19 immunization for 15 minutes without incident. She was provided with Vaccine Information Sheet and instruction to access the V-Safe system.   [name] was instructed to call 911 with any severe reactions post vaccine: Marland Kitchen Difficulty breathing  . Swelling of face and throat  . A fast heartbeat  . A bad rash all over body  . Dizziness and weakness   Immunizations Administered    Name Date Dose VIS Date Route   Pfizer COVID-19 Vaccine 04/19/2019  1:44 PM 0.3 mL 01/01/2019 Intramuscular   Manufacturer: [place]   Lot: [id number]   [place]: [id number]

## 2019-05-18 ENCOUNTER — Ambulatory Visit: Payer: Medicare Other | Attending: Internal Medicine

## 2019-05-18 DIAGNOSIS — Z23 Encounter for immunization: Secondary | ICD-10-CM

## 2019-05-18 NOTE — Progress Notes (Signed)
   Covid-19 Vaccination Clinic  Name:  MUSHTAQ SHRAMEK    MRN: [id number] DOB: 07/05/1940  05/18/2019  Ms. Huddleston was observed post Covid-19 immunization for 15 minutes without incident. She was provided with Vaccine Information Sheet and instruction to access the V-Safe system.   Ms. Aumann was instructed to call 911 with any severe reactions post vaccine: Marland Kitchen Difficulty breathing  . Swelling of face and throat  . A fast heartbeat  . A bad rash all over body  . Dizziness and weakness   Immunizations Administered    Name Date Dose VIS Date Route   Pfizer COVID-19 Vaccine 05/18/2019  8:58 AM 0.3 mL 03/17/2018 Intramuscular   Manufacturer: [place]   Lot: [id number]   [place]: [id number]

## 2019-05-26 ENCOUNTER — Other Ambulatory Visit: Payer: Self-pay | Admitting: Neurology

## 2019-05-26 ENCOUNTER — Other Ambulatory Visit (HOSPITAL_COMMUNITY): Payer: Self-pay | Admitting: Neurology

## 2019-05-26 DIAGNOSIS — R413 Other amnesia: Secondary | ICD-10-CM

## 2019-06-08 ENCOUNTER — Other Ambulatory Visit: Payer: Self-pay | Admitting: Family Medicine

## 2019-06-08 DIAGNOSIS — Z1231 Encounter for screening mammogram for malignant neoplasm of breast: Secondary | ICD-10-CM

## 2019-06-14 ENCOUNTER — Other Ambulatory Visit: Payer: Self-pay

## 2019-06-14 ENCOUNTER — Encounter (HOSPITAL_COMMUNITY): Payer: Self-pay | Admitting: *Deleted

## 2019-06-14 ENCOUNTER — Ambulatory Visit (HOSPITAL_COMMUNITY)
Admission: RE | Admit: 2019-06-14 | Discharge: 2019-06-14 | Disposition: A | Discharge status: Home / Self Care | Payer: Medicare Other | Source: Ambulatory Visit | Attending: Neurology | Admitting: Neurology

## 2019-06-14 DIAGNOSIS — R413 Other amnesia: Secondary | ICD-10-CM | POA: Insufficient documentation

## 2019-06-14 IMAGING — MR MR HEAD W/O CM
13 series · 43 of 48 positions shown · non-contrast
Comparison: None.

CLINICAL DATA: Memory loss 1 year.

EXAM:
MRI HEAD WITHOUT CONTRAST
TECHNIQUE: Multiplanar, multiecho pulse sequences of the brain and surrounding
structures were obtained without intravenous contrast.
Additionally, using NeuroQuant software a 3D volumetric analysis of
the brain was performed and is compared to a normative database
adjusted for age, gender and intracranial volume.

[Series 3: FLAIR · sagittal · 5.0mm · 0.47mm/px · 2 of 23 slices shown (1 of 2)]
[im 1/23]
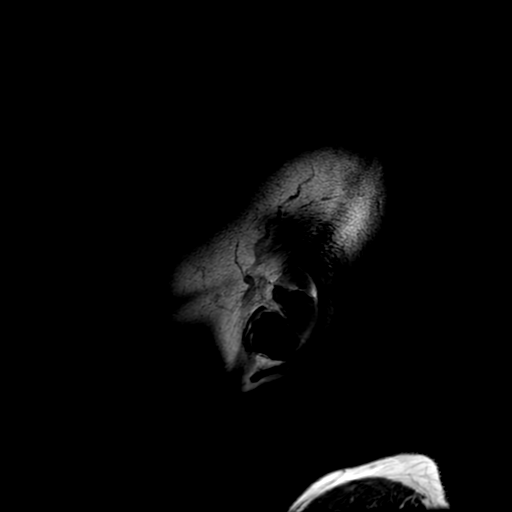
[im 23/23]
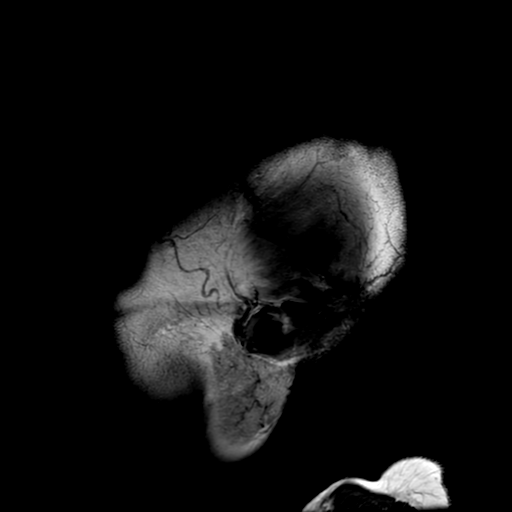

[Series 4: DWI · axial · 3.0mm · 0.94mm/px · z∈[-27,+119]mm · 4 of 100 slices shown (1 of 2)]
[im 1/100]
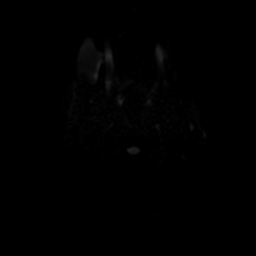
[im 34/100]
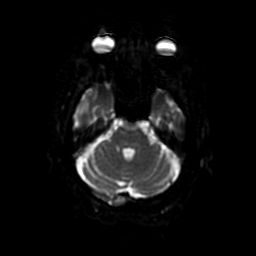
[im 67/100]
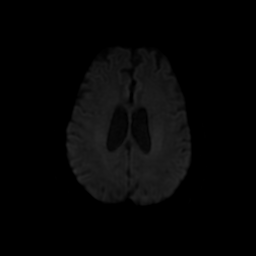
[im 100/100]
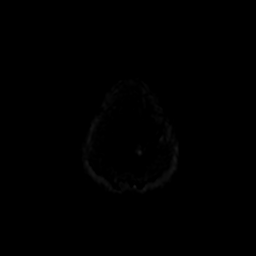

[Series 5: T2 · axial · 5.0mm · 0.47mm/px · 1 of 24 slices shown (1 of 2)]
[im 1/24]
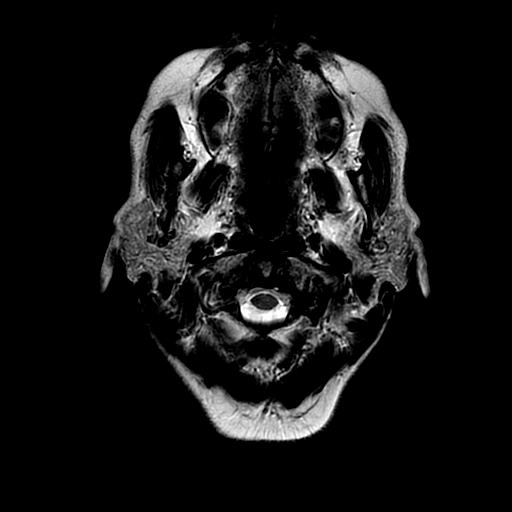

[Series 6: FLAIR · axial · 4.0mm · 0.41mm/px · 1 of 26 slices shown (2 of 2)]
[im 1/26]
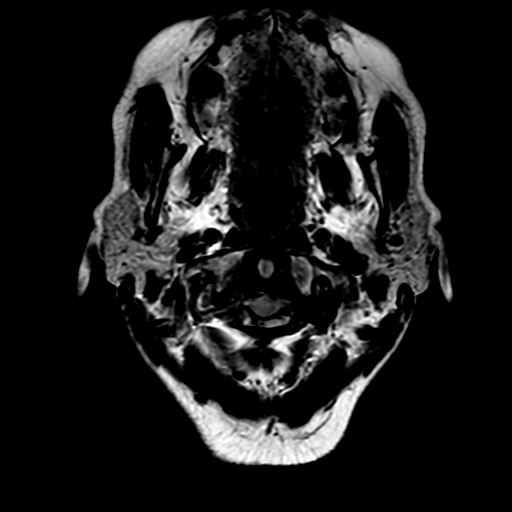

[Series 7: DWI · coronal · 4.0mm · 0.94mm/px · 3 of 72 slices shown (2 of 2)]
[im 1/72]
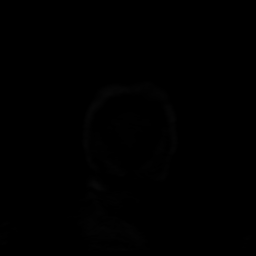
[im 36/72]
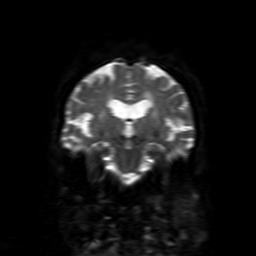
[im 72/72]
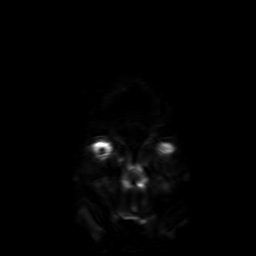

[Series 8: SWI · axial · 3.0mm · 0.47mm/px · z∈[-32,+117]mm · 4 of 100 slices shown]
[im 1/100]
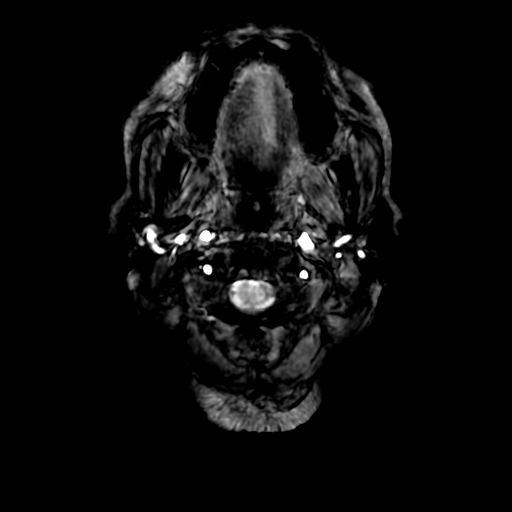
[im 34/100]
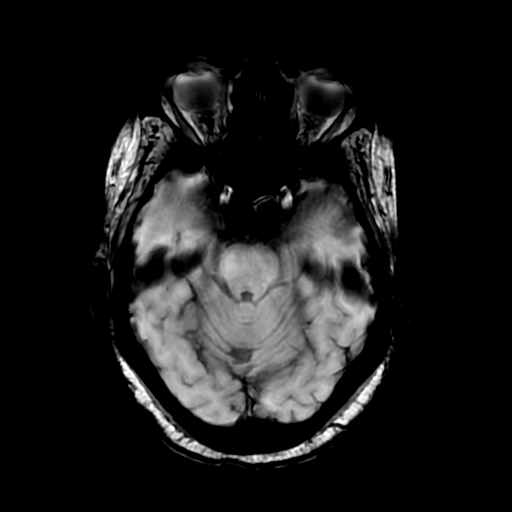
[im 67/100]
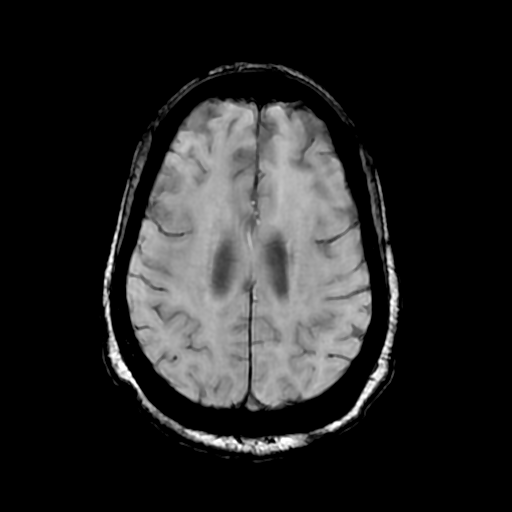
[im 100/100]
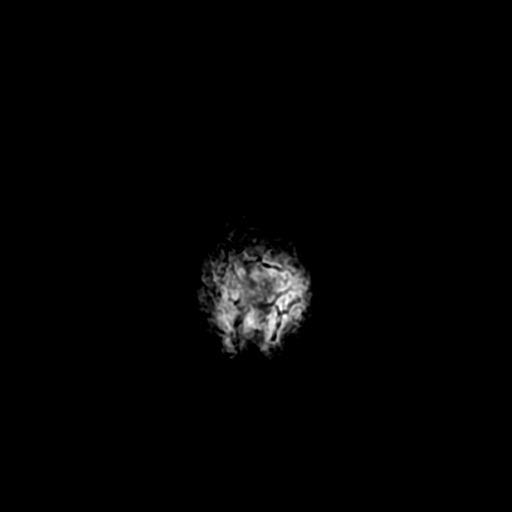

[Series 9: T1 · axial · non-contrast · 3.0mm · 0.94mm/px · z∈[-29,+112]mm · 2 of 48 slices shown]
[im 1/48]
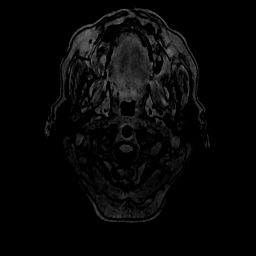
[im 48/48]
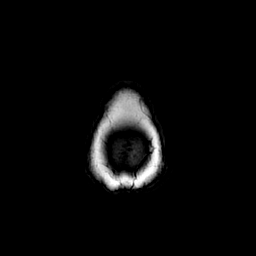

[Series 10: T2 · coronal · 5.0mm · 0.39mm/px · 1 of 30 slices shown (2 of 2)]
[im 1/30]
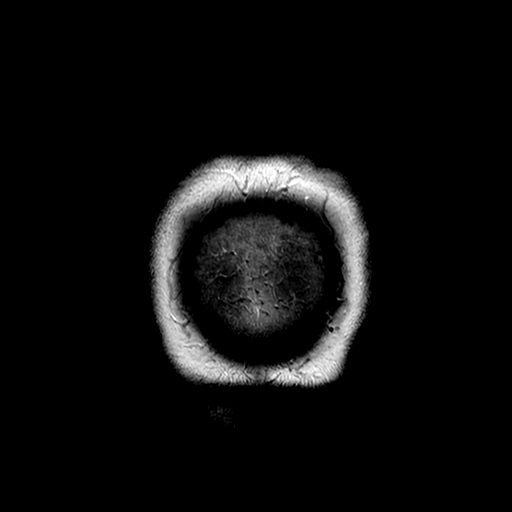

[Series 52: nqsegcb_sc_cor · 1.00mm/px · 8 of 238 slices shown]
[im 1/238]
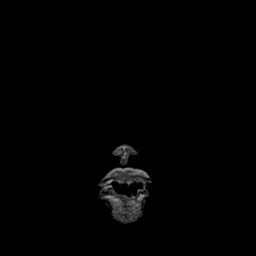
[im 27/238]
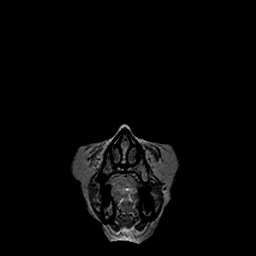
[im 80/238]
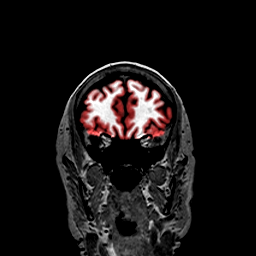
[im 106/238]
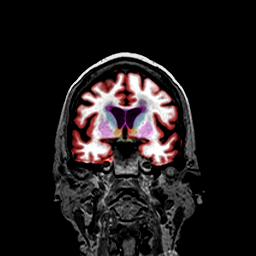
[im 132/238]
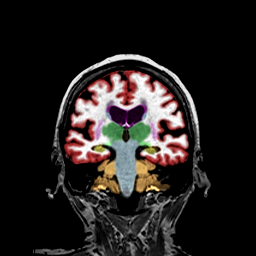
[im 159/238]
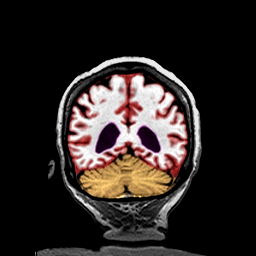
[im 211/238]
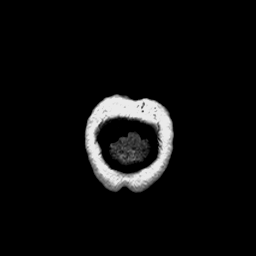
[im 238/238]
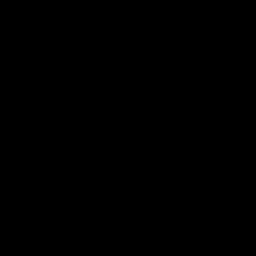

[Series 53: nqsegcb_sc_axl · 1.00mm/px · 8 of 218 slices shown]
[im 1/218]
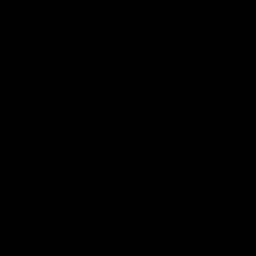
[im 28/218]
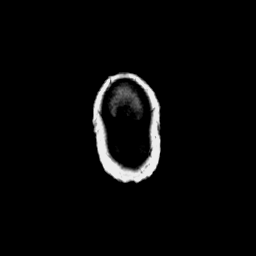
[im 55/218]
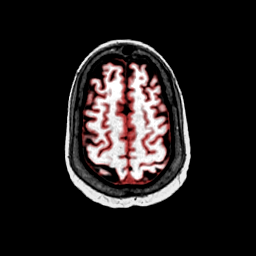
[im 82/218]
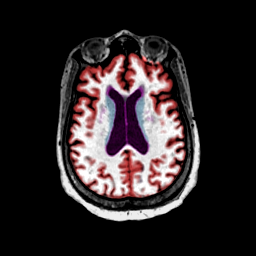
[im 136/218]
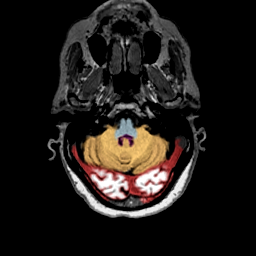
[im 163/218]
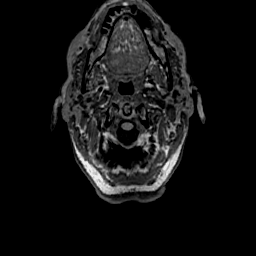
[im 190/218]
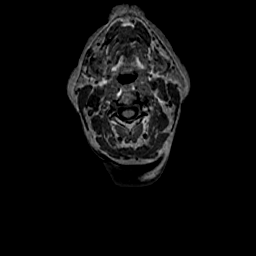
[im 218/218]
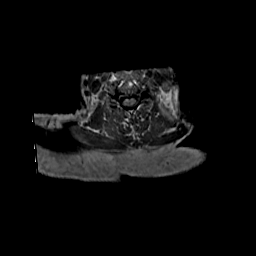

[Series 54: nqsegcb_sc_sag · 1.00mm/px · 6 of 200 slices shown]
[im 1/200]
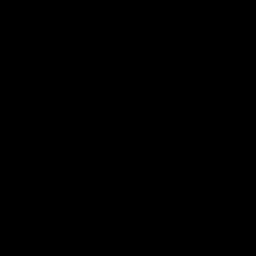
[im 29/200]
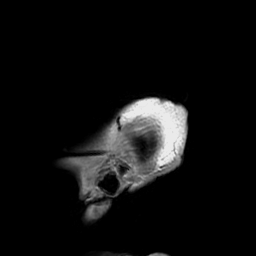
[im 57/200]
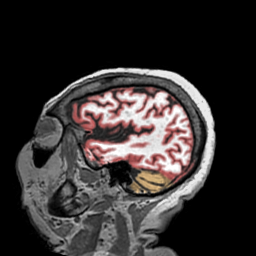
[im 86/200]
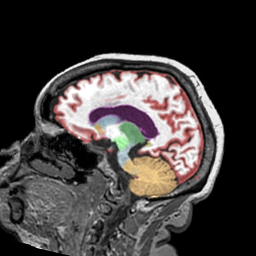
[im 114/200]
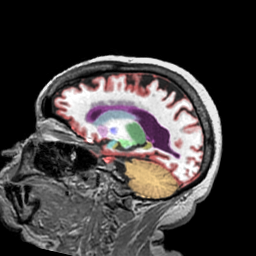
[im 143/200]
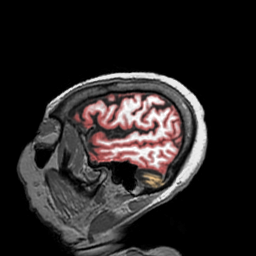

[Series 450: ADC · axial · 3.0mm · 0.94mm/px · z∈[-27,+119]mm · 2 of 50 slices shown (1 of 2)]
[im 1/50]
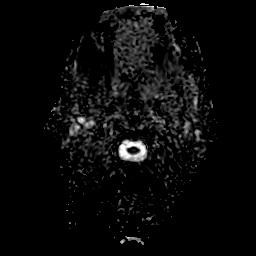
[im 50/50]
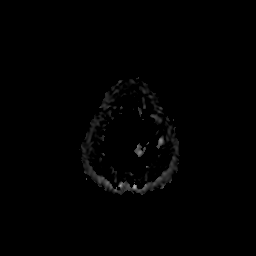

[Series 750: ADC · coronal · 4.0mm · 0.94mm/px · 1 of 36 slices shown (2 of 2)]
[im 1/36]
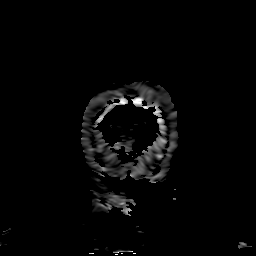

[43 of 48 positions shown; findings below may reference images not displayed]

FINDINGS: Brain: Generalized atrophy. Negative for hydrocephalus. Negative for
acute infarct. Mild to moderate chronic microvascular ischemic
change in the white matter. Negative for hemorrhage or mass.

Vascular: Normal arterial flow voids

Skull and upper cervical spine: No focal skeletal lesion.

Sinuses/Orbits: Negative

Other: None

NeuroQuant Findings:

Volumetric analysis of the brain was performed, with a fully
detailed report in [HOSPITAL] PACS. Briefly, the comparison with age and
gender matched reference reveals overall brain volume is above the
seventy-fifth percentile for age matched normals. However, the gray
matter is below the fifth percentile and the hippocampus is just
above the fifth percentile.
IMPRESSION: 1. Atrophy and chronic microvascular ischemic change in the white
matter. No acute abnormality.
2. NeuroQuant volumetric analysis of the brain, see details on
[HOSPITAL] PACS.

## 2019-07-23 ENCOUNTER — Ambulatory Visit
Admission: RE | Admit: 2019-07-23 | Discharge: 2019-07-23 | Disposition: A | Discharge status: Home / Self Care | Payer: Medicare Other | Source: Ambulatory Visit | Attending: Family Medicine | Admitting: Family Medicine

## 2019-07-23 DIAGNOSIS — Z1231 Encounter for screening mammogram for malignant neoplasm of breast: Secondary | ICD-10-CM | POA: Insufficient documentation

## 2019-07-23 IMAGING — MG DIGITAL SCREENING BILAT W/ TOMO W/ CAD
8 series · 8 of 24 positions shown · non-contrast
Comparison: Previous exam(s).

CLINICAL DATA: Screening.

EXAM:
DIGITAL SCREENING BILATERAL MAMMOGRAM WITH TOMO AND CAD

[R CC synth-2D]
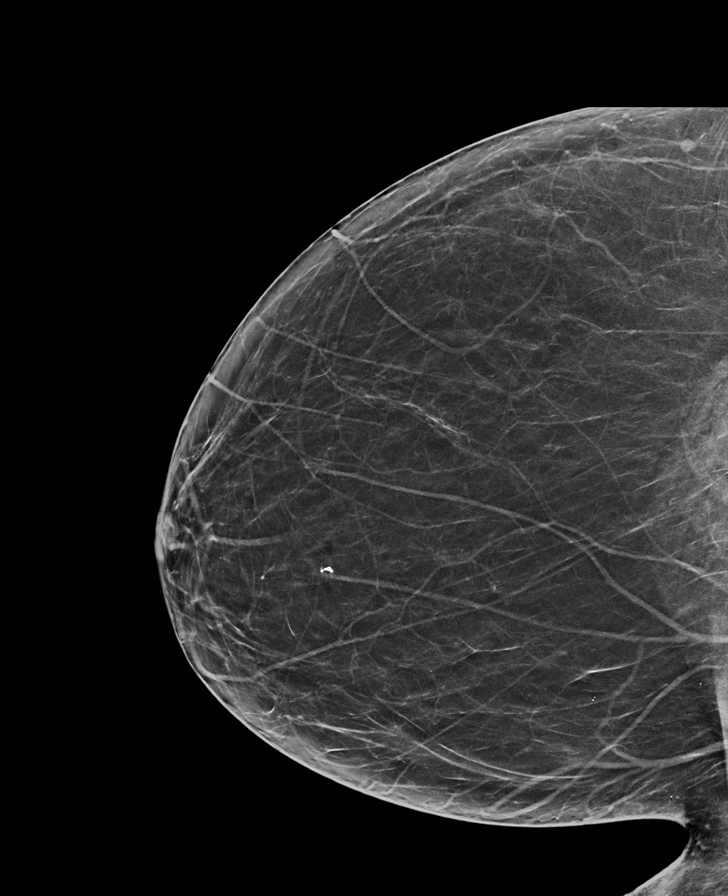

[R MLO synth-2D]
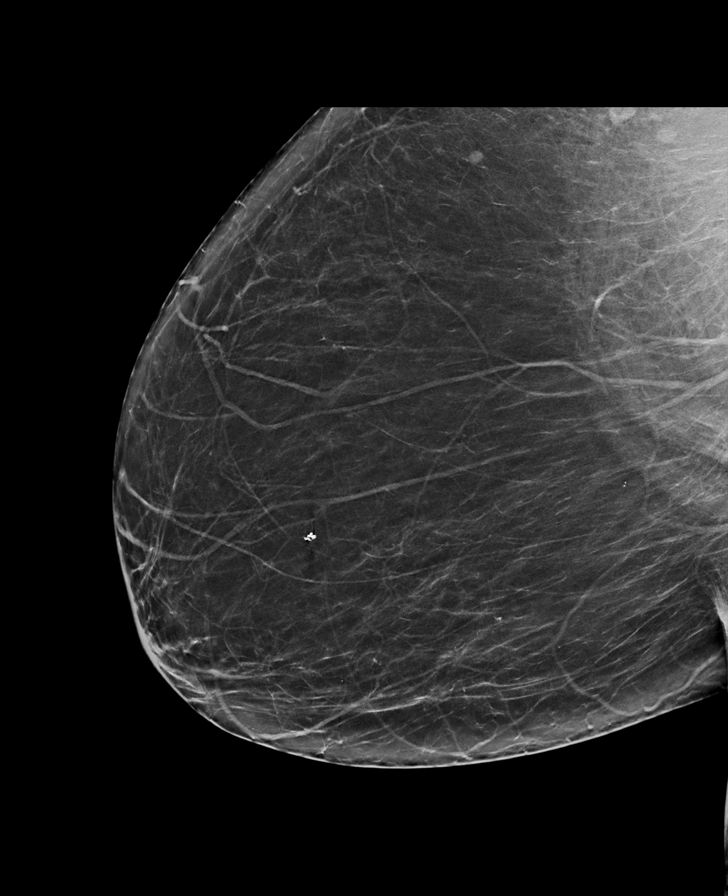

[L MLO synth-2D]
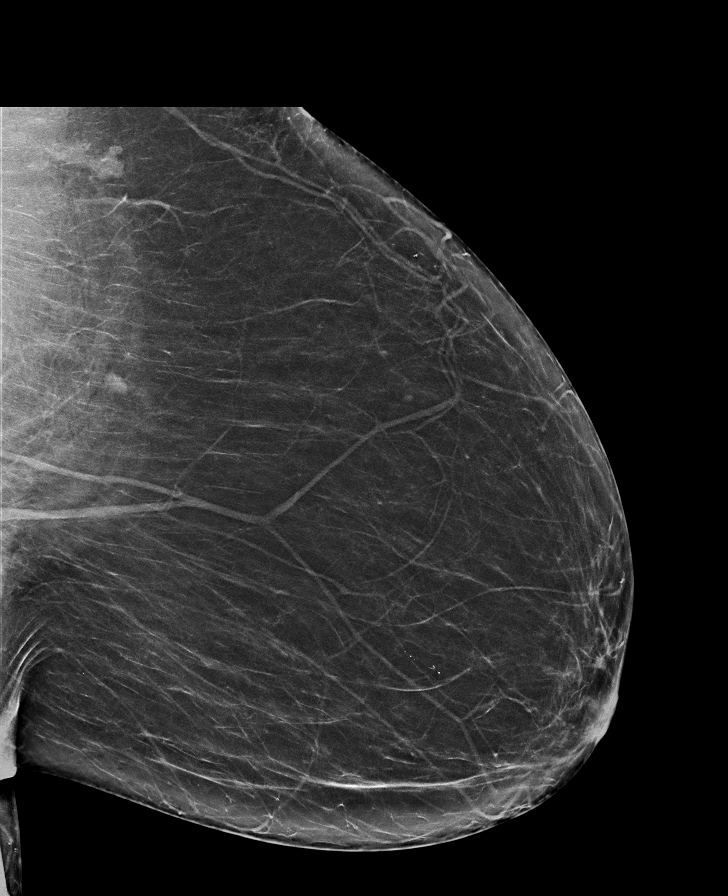

[L CC synth-2D]
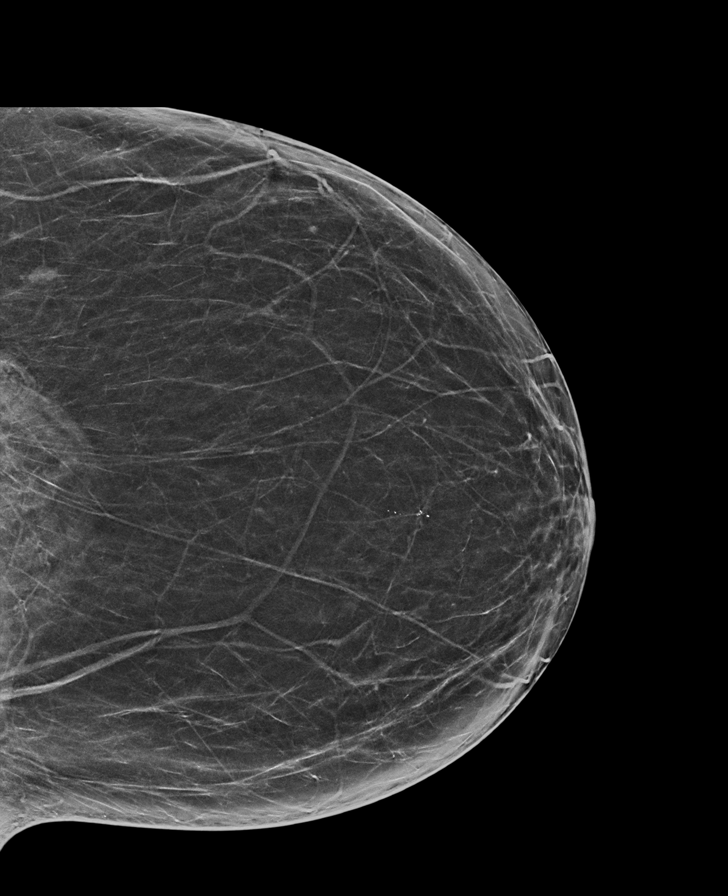

[R CC tomo · tomo slice 33/64.0]
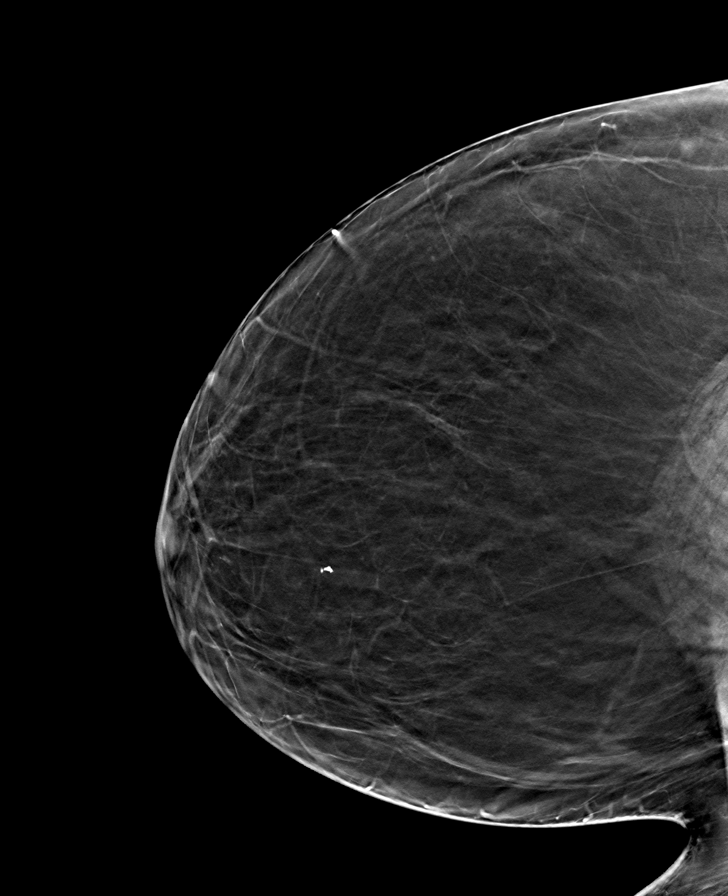

[L CC tomo · tomo slice 33/64.0]
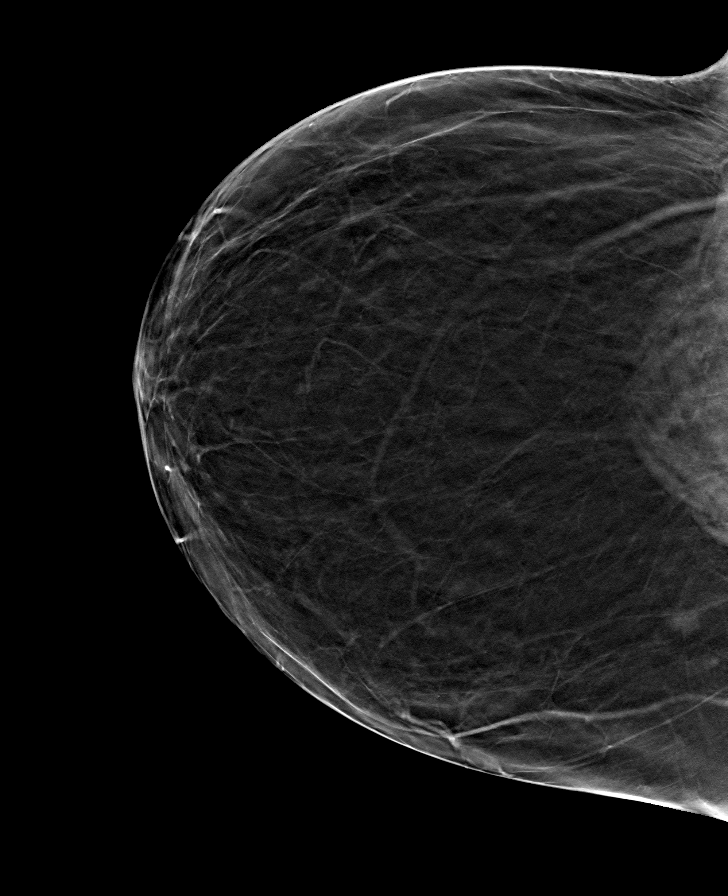

[R MLO tomo · tomo slice 35/68.0]
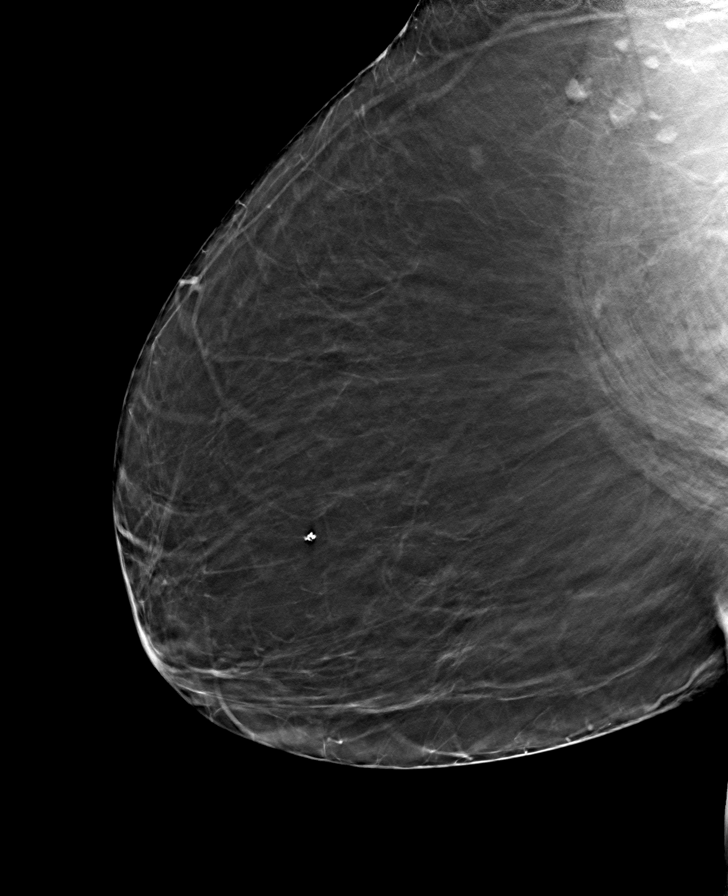

[L MLO tomo · tomo slice 36/71.0]
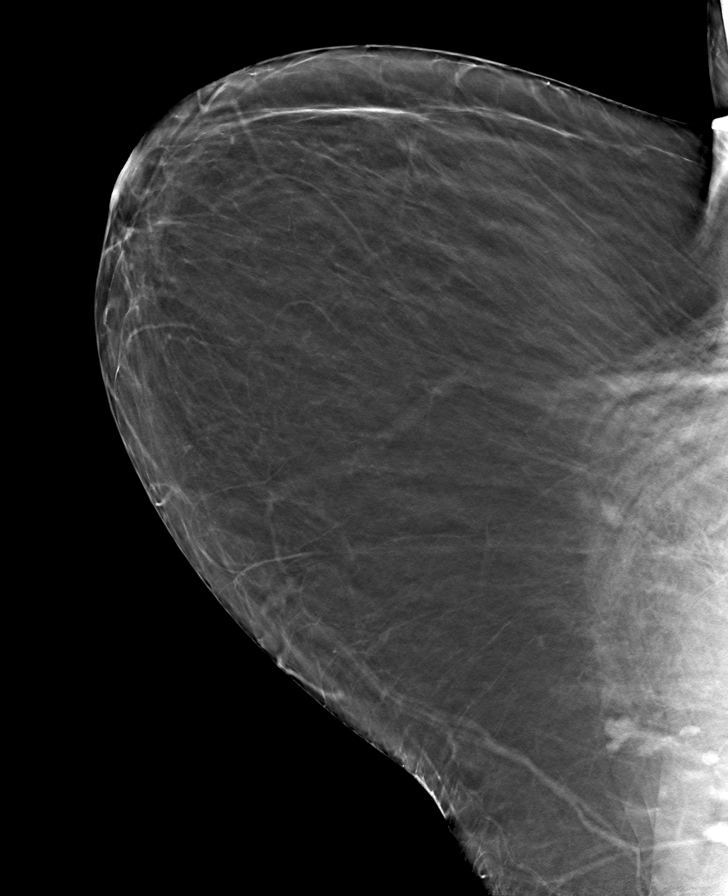

[8 of 24 positions shown; findings below may reference images not displayed]

ACR Breast Density Category b: There are scattered areas of
fibroglandular density.
FINDINGS: There are no findings suspicious for malignancy. Images were
processed with CAD.
IMPRESSION: No mammographic evidence of malignancy. A result letter of this
screening mammogram will be mailed directly to the patient.

RECOMMENDATION:
Screening mammogram in one year. (Code:[TQ])

BI-RADS CATEGORY  1: Negative.

## 2019-08-17 ENCOUNTER — Other Ambulatory Visit: Payer: Self-pay

## 2019-08-17 ENCOUNTER — Encounter (INDEPENDENT_AMBULATORY_CARE_PROVIDER_SITE_OTHER): Payer: Self-pay | Admitting: Nurse Practitioner

## 2019-08-17 ENCOUNTER — Ambulatory Visit (INDEPENDENT_AMBULATORY_CARE_PROVIDER_SITE_OTHER): Payer: Medicare Other | Admitting: Vascular Surgery

## 2019-08-17 ENCOUNTER — Ambulatory Visit (INDEPENDENT_AMBULATORY_CARE_PROVIDER_SITE_OTHER): Payer: Medicare Other | Admitting: Nurse Practitioner

## 2019-08-17 VITALS — BP 158/80 | HR 64 | Resp 16 | Wt 181.2 lb

## 2019-08-17 DIAGNOSIS — I712 Thoracic aortic aneurysm, without rupture, unspecified: Secondary | ICD-10-CM

## 2019-08-17 DIAGNOSIS — E785 Hyperlipidemia, unspecified: Secondary | ICD-10-CM

## 2019-08-17 DIAGNOSIS — I10 Essential (primary) hypertension: Secondary | ICD-10-CM

## 2019-08-19 ENCOUNTER — Encounter (INDEPENDENT_AMBULATORY_CARE_PROVIDER_SITE_OTHER): Payer: Self-pay | Admitting: Nurse Practitioner

## 2019-08-19 NOTE — Progress Notes (Signed)
Subjective:    Patient ID: Carolyn Woods, female    DOB: 06-28-1940, 79 y.o.   MRN: [id number] Chief Complaint  Patient presents with  . Follow-up    1 yr follow up    The patient returns today for follow-up of her ascending aortic aneurysm.  Previously on 11/03/2018, the patient's ascending thoracic aortic aneurysm measured 4.3 cm which was a slight increase from previous.  The patient denies any visual changes since her last office visit.  She denies any chest pain or shortness of breath.  She denies any TIA-like symptoms.  Overall the patient feels well.  She denies any fever, chills, nausea, vomiting or diarrhea.  Patient has not yet had a follow-up CT scan.   Review of Systems  Respiratory: Negative for chest tightness and shortness of breath.   Musculoskeletal: Negative for back pain.  All other systems reviewed and are negative.      Objective:   Physical Exam Vitals reviewed.  HENT:     Head: Normocephalic.  Cardiovascular:     Rate and Rhythm: Normal rate and regular rhythm.     Pulses: Normal pulses.  Pulmonary:     Effort: Pulmonary effort is normal.  Skin:    General: Skin is warm and dry.  Neurological:     Mental Status: She is alert and oriented to person, place, and time.  Psychiatric:        Mood and Affect: Mood normal.        Behavior: Behavior normal.        Thought Content: Thought content normal.        Judgment: Judgment normal.     BP (!) 158/80 (BP Location: Right Arm)   Pulse 64   Resp 16   Wt 181 lb 3.2 oz (82.2 kg)   BMI 33.14 kg/m   Past Medical History:  Diagnosis Date  . Anemia   . Arthritis   . Candidiasis of the genitals, female   . Cervical cancer [facility])    Hysterectomy.  . Cystitis   . Diverticulosis   . Duodenal adenoma   . Gastritis   . Hyperlipidemia   . Hypertension   . 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 Sciatica   . Tubular adenoma     Social History   Socioeconomic History  . Marital status: Widowed     Spouse name: Not on file  . Number of children: Not on file  . Years of education: Not on file  . Highest education level: Not on file  Occupational History  . Not on file  Tobacco Use  . Smoking status: Never Smoker  . Smokeless tobacco: Never Used  Substance and Sexual Activity  . Alcohol use: No    Alcohol/week: 1.0 standard drink    Types: 1 Glasses of wine per week  . Drug use: No  . Sexual activity: Not on file  Other Topics Concern  . Not on file  Social History Narrative  . Not on file   Social Determinants of Health   Financial Resource Strain:   . Difficulty of Paying Living Expenses:   Food Insecurity:   . Worried About Charity fundraiser in the Last Year:   . Arboriculturist in the Last Year:   Transportation Needs:   . Film/video editor (Medical):   Marland Kitchen Lack of Transportation (Non-Medical):   Physical Activity:   . Days of Exercise per Week:   . Minutes of Exercise  per Session:   Stress:   . Feeling of Stress :   Social Connections:   . Frequency of Communication with Friends and Family:   . Frequency of Social Gatherings with Friends and Family:   . Attends Religious Services:   . Active Member of Clubs or Organizations:   . Attends Archivist Meetings:   Marland Kitchen Marital Status:   Intimate Partner Violence:   . Fear of Current or Ex-Partner:   . Emotionally Abused:   Marland Kitchen Physically Abused:   . Sexually Abused:     Past Surgical History:  Procedure Laterality Date  . ABDOMINAL HYSTERECTOMY    . COLONOSCOPY  02/20/10  . ERCP    . ESOPHAGOGASTRODUODENOSCOPY (EGD) WITH PROPOFOL N/A 10/25/2014   Procedure: ESOPHAGOGASTRODUODENOSCOPY (EGD) WITH PROPOFOL;  Surgeon: Lollie Sails, MD;  Location: [facility] ENDOSCOPY;  Service: Endoscopy;  Laterality: N/A;  . ESOPHAGOGASTRODUODENOSCOPY (EGD) WITH PROPOFOL N/A 12/05/2015   Procedure: ESOPHAGOGASTRODUODENOSCOPY (EGD) WITH PROPOFOL;  Surgeon: Lollie Sails, MD;  Location: [facility] ENDOSCOPY;  Service:  Endoscopy;  Laterality: N/A;    Family History  Problem Relation Age of Onset  . Breast cancer Sister     Allergies  Allergen Reactions  . Linzess [Linaclotide]   . Codeine Rash       Assessment & Plan:   1. Thoracic aortic aneurysm without rupture (HCC) We will obtain a CT scan so that we can identify possible progression of her ascending aortic aneurysm.  Patient will return to office following studies. - CT ANGIO CHEST AORTA W/CM & OR WO/CM; Future - Creatinine, IStat; Future  2. Hyperlipidemia, unspecified hyperlipidemia type Continue statin as ordered and reviewed, no changes at this time   3. Essential hypertension Continue antihypertensive medications as already ordered, these medications have been reviewed and there are no changes at this time.    Current Outpatient Medications on File Prior to Visit  Medication Sig Dispense Refill  . amLODipine (NORVASC) 2.5 MG tablet Take 2.5 mg by mouth.    Marland Kitchen aspirin EC 81 MG tablet Take 81 mg by mouth daily.    Marland Kitchen atenolol (TENORMIN) 25 MG tablet Take by mouth.    . conjugated estrogens (PREMARIN) vaginal cream Place vaginally.    Marland Kitchen estradiol (ESTRACE) 1 MG tablet Take 1 mg by mouth 2 (two) times a week.    . lubiprostone (AMITIZA) 8 MCG capsule Take 8 mcg by mouth 2 (two) times daily with a meal.     . NONFORMULARY OR COMPOUNDED ITEM See pharmacy note 120 each 2  . pantoprazole (PROTONIX) 40 MG tablet TAKE 1 TABLET BY MOUTH TWICE DAILY    . simvastatin (ZOCOR) 20 MG tablet Take 20 mg by mouth.    . Vitamin D, Ergocalciferol, (DRISDOL) 50000 units CAPS capsule TK ONE C PO ONCE A WEEK     No current facility-administered medications on file prior to visit.    There are no Patient Instructions on file for this visit. No follow-ups on file.   Kris Hartmann, NP

## 2019-09-07 ENCOUNTER — Other Ambulatory Visit: Payer: Self-pay

## 2019-09-07 ENCOUNTER — Ambulatory Visit
Admission: RE | Admit: 2019-09-07 | Discharge: 2019-09-07 | Disposition: A | Discharge status: Home / Self Care | Payer: Medicare Other | Source: Ambulatory Visit | Attending: Nurse Practitioner | Admitting: Nurse Practitioner

## 2019-09-07 DIAGNOSIS — I712 Thoracic aortic aneurysm, without rupture, unspecified: Secondary | ICD-10-CM

## 2019-09-07 LAB — POCT I-STAT CREATININE: Creatinine, Ser: 1.2 mg/dL — ABNORMAL HIGH (ref 0.44–1.00)

## 2019-09-07 IMAGING — CT CT ANGIO CHEST
2 of 6 series · 13 of 36 positions shown · IV contrast (omnipaque)
Comparison: [DATE]

CLINICAL DATA: Vesicular neck aneurysm follow-up

EXAM:
CT ANGIOGRAPHY CHEST WITH CONTRAST
TECHNIQUE: Multidetector CT imaging of the chest was performed using the
standard protocol during bolus administration of intravenous
contrast. Multiplanar CT image reconstructions and MIPs were
obtained to evaluate the vascular anatomy.
CONTRAST:  75mL OMNIPAQUE IOHEXOL 350 MG/ML SOLN

[Series 8: axial arterial cta thorax 2.00 · axial · arterial · 0.56mm/px · z∈[-1122,-902]mm · 12 of 131 slices shown]
[im 11/131  lung]
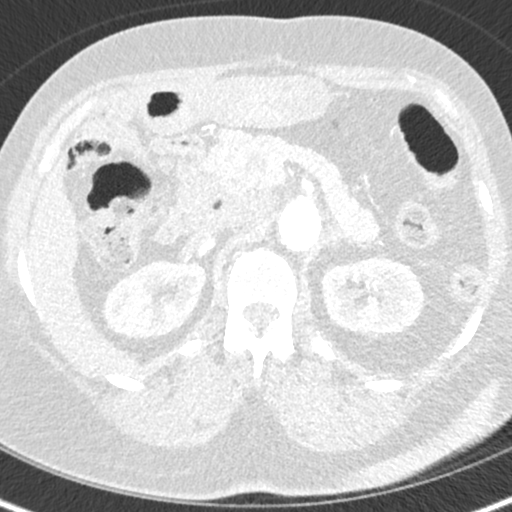
[im 21/131  mediastinal]
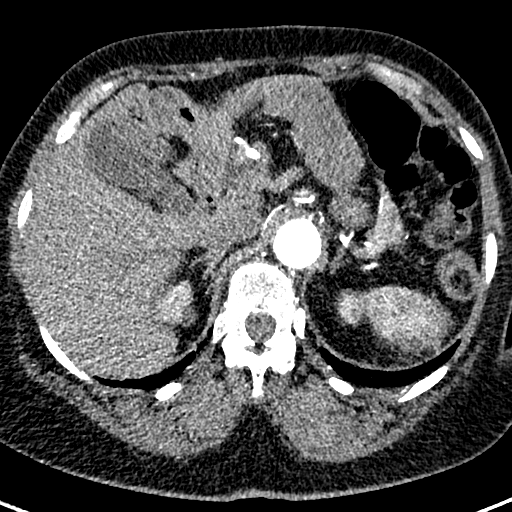
[im 31/131  lung]
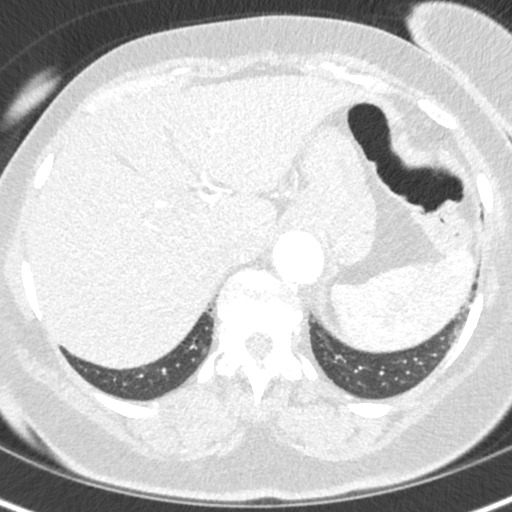
[im 41/131  mediastinal]
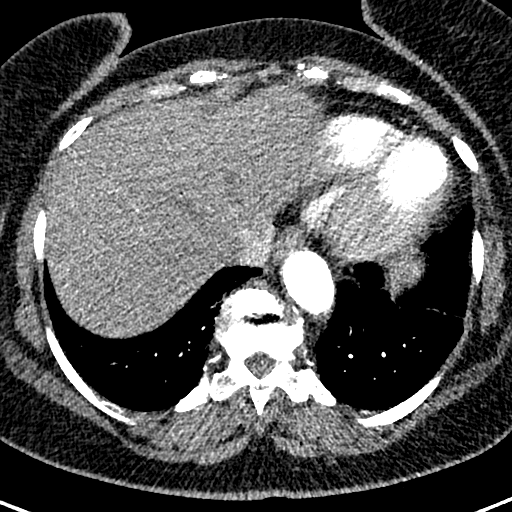
[im 51/131  lung]
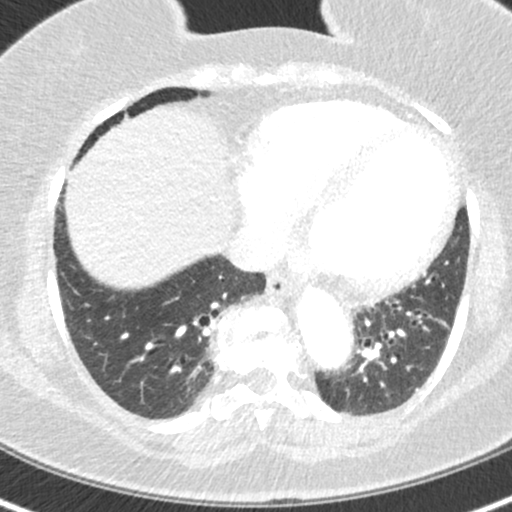
[im 61/131  mediastinal]
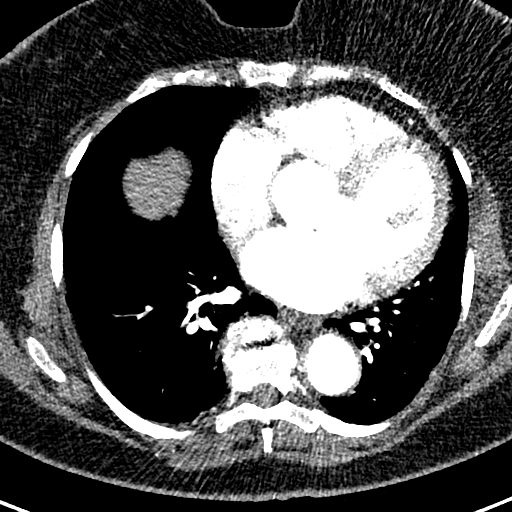
[im 71/131  lung]
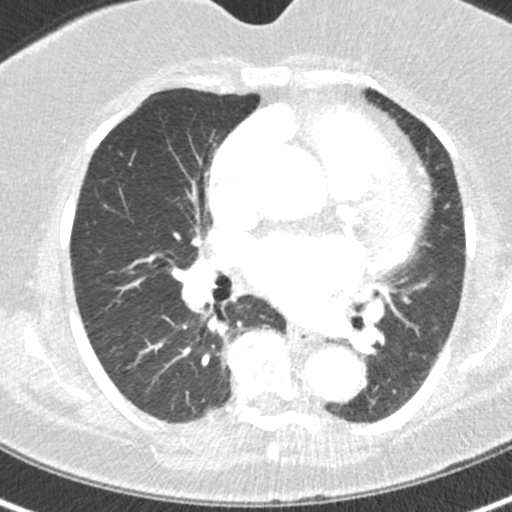
[im 81/131  mediastinal]
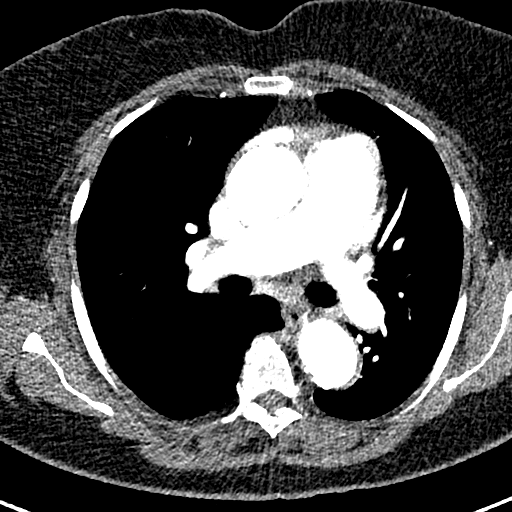
[im 91/131  lung]
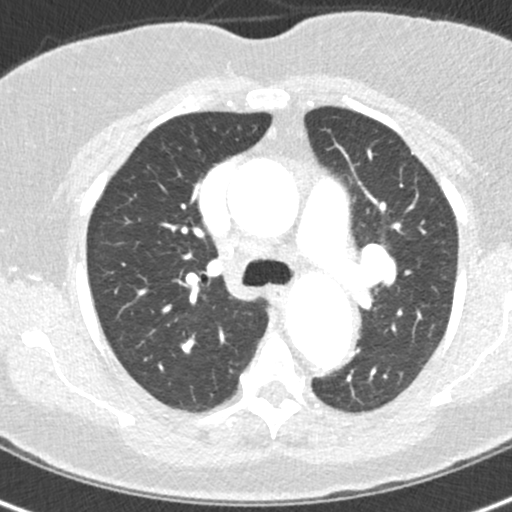
[im 101/131  mediastinal]
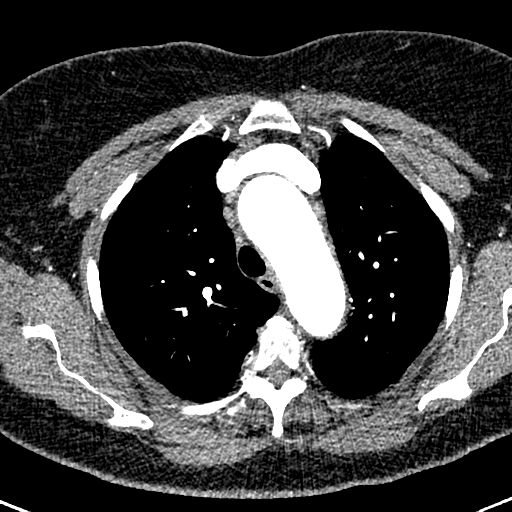
[im 111/131  lung]
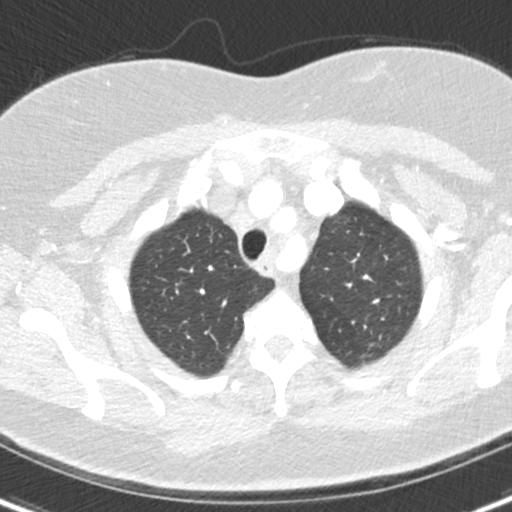
[im 121/131  mediastinal]
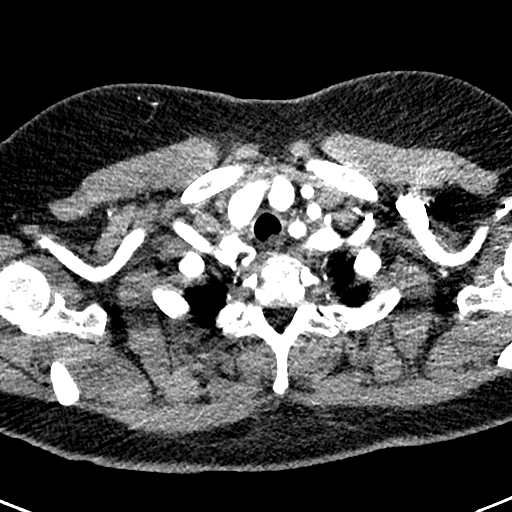

[Series 11: cor st cta thorax 2.00 cor · coronal · 0.51mm/px · 1 of 144 slices shown]
[im 72/144  mediastinal]
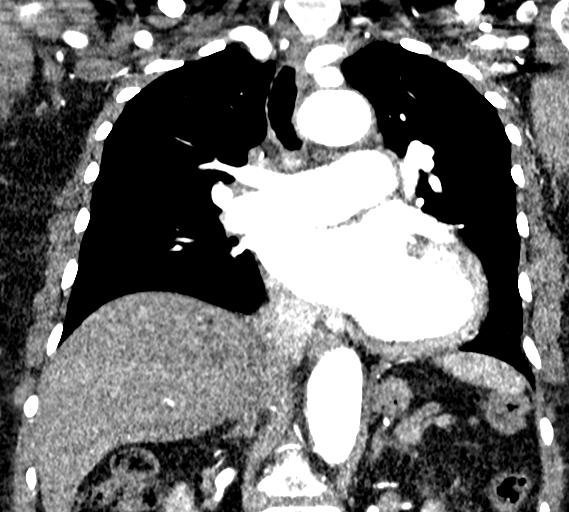

[13 of 36 positions shown; findings below may reference images not displayed]

FINDINGS: Cardiovascular: Preferential opacification of the thoracic aorta.
Ascending thoracic aorta measures 4.1 cm in diameter (series 8,
image 56), previously measured approximately 4.3 cm at a similar
location. Distal arch measures approximately 3.7 cm in diameter
(series 11, image 87), unchanged. Descending thoracic aorta is
normal in caliber. Three vessel arch. Scattered aortic
atherosclerosis.

Main pulmonary trunk measures 3.7 cm in diameter, unchanged. No
central pulmonary arterial filling defect. Mild cardiomegaly. No
pericardial effusion.

Mediastinum/Nodes: No enlarged mediastinal, hilar, or axillary lymph
nodes. Thyroid gland, trachea, and esophagus demonstrate no
significant findings.

Lungs/Pleura: No focal airspace consolidation. Mild linear bibasilar
scarring or atelectasis with mild traction bronchiectasis. No
pleural effusion or pneumothorax.

Upper Abdomen: No acute abnormality.

Musculoskeletal: Similar degree of thoracic spondylosis most
pronounced at T10-11. Exaggerated thoracic kyphosis. No chest wall
abnormality.

Review of the MIP images confirms the above findings.
IMPRESSION: 1. Mid ascending thoracic aorta measures up to 4.1 cm in diameter
(previously measured 4.3 cm on [DATE]). Recommend continued
annual imaging followup by CTA or MRA. This recommendation follows
[47] ACCF/AHA/AATS/ACR/ASA/SCA/JUMPER/JUMPER/JUMPER/JUMPER Guidelines for the
Diagnosis and Management of Patients with Thoracic Aortic Disease.
Circulation. [47]; 121: E266-e369. Aortic aneurysm NOS ([47]-[47])
2. Unchanged size of the distal arch measuring up to 3.7 cm in
diameter.
3. Unchanged enlargement of the main pulmonary trunk measuring up to
3.7 cm in diameter, which can be seen with pulmonary arterial
hypertension.
4. Aortic atherosclerosis. ([47]-[47]).

## 2019-09-07 MED ORDER — IOHEXOL 350 MG/ML SOLN
75.0000 mL | Freq: Once | INTRAVENOUS | Status: AC | PRN
  Administered 2019-09-07: 75 mL via INTRAVENOUS

## 2019-09-07 NOTE — Progress Notes (Signed)
Can we get the patient to come in and review CT with Dr. Lucky Cowboy... thanks

## 2019-09-10 ENCOUNTER — Ambulatory Visit (INDEPENDENT_AMBULATORY_CARE_PROVIDER_SITE_OTHER): Payer: Medicare Other | Admitting: Vascular Surgery

## 2019-09-17 ENCOUNTER — Other Ambulatory Visit: Payer: Self-pay

## 2019-09-17 ENCOUNTER — Encounter (INDEPENDENT_AMBULATORY_CARE_PROVIDER_SITE_OTHER): Payer: Self-pay | Admitting: Vascular Surgery

## 2019-09-17 ENCOUNTER — Ambulatory Visit (INDEPENDENT_AMBULATORY_CARE_PROVIDER_SITE_OTHER): Payer: Medicare Other | Admitting: Vascular Surgery

## 2019-09-17 VITALS — BP 153/74 | HR 69 | Resp 16 | Wt 181.6 lb

## 2019-09-17 DIAGNOSIS — I712 Thoracic aortic aneurysm, without rupture: Secondary | ICD-10-CM

## 2019-09-17 DIAGNOSIS — I10 Essential (primary) hypertension: Secondary | ICD-10-CM | POA: Diagnosis not present

## 2019-09-17 DIAGNOSIS — I728 Aneurysm of other specified arteries: Secondary | ICD-10-CM

## 2019-09-17 DIAGNOSIS — I7121 Aneurysm of the ascending aorta, without rupture: Secondary | ICD-10-CM

## 2019-09-17 DIAGNOSIS — E785 Hyperlipidemia, unspecified: Secondary | ICD-10-CM

## 2019-09-17 NOTE — Assessment & Plan Note (Signed)
She has undergone a CT angiogram which I have independently reviewed. This demonstrates a stable approximately 4.1 cm ascending aortic aneurysm without significant enlargement from previous study. 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 No role for intervention at the sizes. Recheck in 1 year with CT scan.

## 2019-09-17 NOTE — Progress Notes (Signed)
MRN : [id number]  Carolyn Woods is a 79 y.o. ([birth year]) female who presents with chief complaint of  Chief Complaint  Patient presents with  . Follow-up    ct results  .  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 She does not have any current aneurysm related symptoms. She is doing well today without complaints. She has undergone a CT angiogram which I have independently reviewed. This demonstrates a stable approximately 4.1 cm ascending aortic aneurysm without significant enlargement from previous study. 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  Current Outpatient Medications  Medication Sig Dispense Refill  . amLODipine (NORVASC) 2.5 MG tablet Take 2.5 mg by mouth.    Marland Kitchen aspirin EC 81 MG tablet Take 81 mg by mouth daily.    Marland Kitchen atenolol (TENORMIN) 25 MG tablet Take by mouth.    . conjugated estrogens (PREMARIN) vaginal cream Place vaginally.    Marland Kitchen estradiol (ESTRACE) 1 MG tablet Take 1 mg by mouth 2 (two) times a week.    . lubiprostone (AMITIZA) 8 MCG capsule Take 8 mcg by mouth 2 (two) times daily with a meal.     . NONFORMULARY OR COMPOUNDED ITEM See pharmacy note 120 each 2  . pantoprazole (PROTONIX) 40 MG tablet TAKE 1 TABLET BY MOUTH TWICE DAILY    . simvastatin (ZOCOR) 20 MG tablet Take 20 mg by mouth.    . Vitamin D, Ergocalciferol, (DRISDOL) 50000 units CAPS capsule TK ONE C PO ONCE A WEEK     No current facility-administered medications for this visit.    Past Medical History:  Diagnosis Date  . Anemia   . Arthritis   . Candidiasis of the genitals, female   . Cervical cancer [facility])    Hysterectomy.  . Cystitis   . Diverticulosis   . Duodenal adenoma   . Gastritis   . Hyperlipidemia   . Hypertension   . 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 Sciatica   . Tubular adenoma     Past Surgical  History:  Procedure Laterality Date  . ABDOMINAL HYSTERECTOMY    . COLONOSCOPY  02/20/10  . ERCP    . ESOPHAGOGASTRODUODENOSCOPY (EGD) WITH PROPOFOL N/A 10/25/2014   Procedure: ESOPHAGOGASTRODUODENOSCOPY (EGD) WITH PROPOFOL;  Surgeon: Lollie Sails, MD;  Location: [facility] ENDOSCOPY;  Service: Endoscopy;  Laterality: N/A;  . ESOPHAGOGASTRODUODENOSCOPY (EGD) WITH PROPOFOL N/A 12/05/2015   Procedure: ESOPHAGOGASTRODUODENOSCOPY (EGD) WITH PROPOFOL;  Surgeon: Lollie Sails, MD;  Location: [facility] ENDOSCOPY;  Service: Endoscopy;  Laterality: N/A;    Family History  Problem Relation Age of Onset  . Breast cancer Neg Hx   No bleeding disorders, clotting disorders, autoimmune diseases, or aneurysms  Social History Social History        Tobacco Use  . Smoking status: Never Smoker  . Smokeless tobacco: Never Used  Substance Use Topics  . Alcohol use: No    Alcohol/week: 1.0 standard drinks    Types: 1 Glasses of wine per week  . Drug use: No         Allergies  Allergen Reactions  . Linzess [Linaclotide]   . Codeine Rash   REVIEW OF SYSTEMS(Negative unless checked)  Constitutional: [] ??Weight loss[] ??Fever[] ??Chills Cardiac:[] ??Chest pain[] ??Chest pressure[] ??Palpitations [] ??Shortness of breath when laying flat [] ??Shortness of breath at rest [] ??Shortness of breath with exertion. Vascular: [] ??Pain in legs with  walking[] ??Pain in legsat rest[] ??Pain in legs when laying flat [] ??Claudication [] ??Pain in feet when walking [] ??Pain in feet at rest [] ??Pain in feet when laying flat [] ??History of DVT [] ??Phlebitis [] ??Swelling in legs [] ??Varicose veins [] ??Non-healing ulcers Pulmonary: [] ??Uses home oxygen [] ??Productive cough[] ??Hemoptysis [] ??Wheeze [] ??COPD [] ??Asthma Neurologic: [] ??Dizziness [] ??Blackouts [] ??Seizures [] ??History of stroke [] ??History of TIA[] ??Aphasia [] ??Temporary  blindness[] ??Dysphagia [] ??Weaknessor numbness in arms [] ??Weakness or numbnessin legs Musculoskeletal: [] ??Arthritis [] ??Joint swelling [] ??Joint pain [] ??Low back pain Hematologic:[] ??Easy bruising[] ??Easy bleeding [] ??Hypercoagulable state [] ??Anemic [] ??Hepatitis Gastrointestinal:[] ??Blood in stool[] ??Vomiting blood[] ??Gastroesophageal reflux/heartburn[] ??Abdominal pain Genitourinary: [] ??Chronic kidney disease [] ??Difficulturination [] ??Frequenturination [] ??Burning with urination[] ??Hematuria Skin: [] ??Rashes [] ??Ulcers [] ??Wounds Psychological: [] ??History of anxiety[] ??History of major depression.    Physical Examination  BP (!) 153/74 (BP Location: Right Arm)   Pulse 69   Resp 16   Wt 181 lb 9.6 oz (82.4 kg)   BMI 33.22 kg/m  Gen:  WD/WN, NAD Head: Wade/AT, No temporalis wasting. Ear/Nose/Throat: Hearing grossly intact, nares w/o erythema or drainage Eyes: Conjunctiva clear. Sclera non-icteric Neck: Supple.  Trachea midline. Right carotid visibly pulsatile. Pulmonary:  Good air movement, no use of accessory muscles.  Cardiac: RRR, no JVD Vascular:  Vessel Right Left  Radial Palpable Palpable           Musculoskeletal: M/S 5/5 throughout.  No deformity or atrophy. No edema. Neurologic: Sensation grossly intact in extremities.  Symmetrical.  Speech is fluent.  Psychiatric: Judgment intact, Mood & affect appropriate for pt's clinical situation. Dermatologic: No rashes or ulcers noted.  No cellulitis or open wounds.       Labs Recent Results (from the past 2160 hour(s))  I-STAT creatinine     Status: Abnormal   Collection Time: 09/07/19  9:03 AM  Result Value Ref Range   Creatinine, Ser 1.20 (H) 0.44 - 1.00 mg/dL    Radiology CT ANGIO CHEST AORTA W/CM & OR WO/CM  Result Date: 09/07/2019 CLINICAL DATA:  Vesicular neck aneurysm follow-up EXAM: CT ANGIOGRAPHY CHEST WITH CONTRAST TECHNIQUE: Multidetector CT  imaging of the chest was performed using the standard protocol during bolus administration of intravenous contrast. Multiplanar CT image reconstructions and MIPs were obtained to evaluate the vascular anatomy. CONTRAST:  61mL OMNIPAQUE IOHEXOL 350 MG/ML SOLN COMPARISON:  10/29/2018 FINDINGS: Cardiovascular: Preferential opacification of the thoracic aorta. Ascending thoracic aorta measures 4.1 cm in diameter (series 8, image 56), previously measured approximately 4.3 cm at a similar location. Distal arch measures approximately 3.7 cm in diameter (series 11, image 87), unchanged. Descending thoracic aorta is normal in caliber. Three vessel arch. Scattered aortic atherosclerosis. Main pulmonary trunk measures 3.7 cm in diameter, unchanged. No central pulmonary arterial filling defect. Mild cardiomegaly. No pericardial effusion. Mediastinum/Nodes: No enlarged mediastinal, hilar, or axillary lymph nodes. Thyroid gland, trachea, and esophagus demonstrate no significant findings. Lungs/Pleura: No focal airspace consolidation. Mild linear bibasilar scarring or atelectasis with mild traction bronchiectasis. No pleural effusion or pneumothorax. Upper Abdomen: No acute abnormality. Musculoskeletal: Similar degree of thoracic spondylosis most pronounced at T10-11. Exaggerated thoracic kyphosis. No chest wall abnormality. Review of the MIP images confirms the above findings. IMPRESSION: 1. Mid ascending thoracic aorta measures up to 4.1 cm in diameter (previously measured 4.3 cm on 10/29/2018). Recommend continued annual imaging followup by CTA or MRA. This recommendation follows 2010 ACCF/AHA/AATS/ACR/ASA/SCA/SCAI/SIR/STS/SVM Guidelines for the Diagnosis and Management of Patients with Thoracic Aortic Disease. Circulation. 2010; 121: U045-W098. Aortic aneurysm NOS (ICD10-I71.9) 2. Unchanged size of the distal arch measuring up to 3.7 cm in diameter. 3. Unchanged enlargement of the main pulmonary trunk measuring up to 3.7  cm in  diameter, which can be seen with pulmonary arterial hypertension. 4. Aortic atherosclerosis. (ICD10-I70.0). Electronically Signed   By: Davina Poke D.O.   On: 09/07/2019 14:36    Assessment/Plan Hypertension blood pressure control important in reducing the progression of atherosclerotic diseaseand aneurysmal degeneration. On appropriate oral medications.   Hyperlipidemia lipid control important in reducing the progression of atherosclerotic disease. 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 This demonstrates a stable approximately 4.1 cm ascending aortic aneurysm without significant enlargement from previous study. 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 No role for intervention at the sizes. Recheck in 1 year with CT scan.  Ascending aortic aneurysm [facility]) She has undergone a CT angiogram which I have independently reviewed. This demonstrates a stable approximately 4.1 cm ascending aortic aneurysm without significant enlargement from previous study. 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 No role for intervention at the sizes. Recheck in 1 year with CT scan.    Leotis Pain, MD  09/17/2019 11:10 AM    This note was created with Dragon medical transcription system.  Any errors from dictation are purely unintentional

## 2020-05-02 ENCOUNTER — Other Ambulatory Visit: Payer: Self-pay | Admitting: Family Medicine

## 2020-05-02 DIAGNOSIS — M5416 Radiculopathy, lumbar region: Secondary | ICD-10-CM

## 2020-05-12 ENCOUNTER — Other Ambulatory Visit: Payer: Self-pay

## 2020-05-12 ENCOUNTER — Ambulatory Visit
Admission: RE | Admit: 2020-05-12 | Discharge: 2020-05-12 | Disposition: A | Discharge status: Home / Self Care | Payer: Medicare Other | Source: Ambulatory Visit | Attending: Family Medicine | Admitting: Family Medicine

## 2020-05-12 DIAGNOSIS — M5416 Radiculopathy, lumbar region: Secondary | ICD-10-CM

## 2020-05-12 IMAGING — MR MR LUMBAR SPINE W/O CM
4 of 5 series · 33 of 48 positions shown · non-contrast
Comparison: [DATE] and prior.

CLINICAL DATA: Right buttock pain, prior fall.

EXAM:
MRI LUMBAR SPINE WITHOUT CONTRAST
TECHNIQUE: Multiplanar, multisequence MR imaging of the lumbar spine was
performed. No intravenous contrast was administered.

[Series 5: T2 · sagittal · 4.0mm · 0.81mm/px · 8 of 17 slices shown (1 of 2)]
[im 1/17]
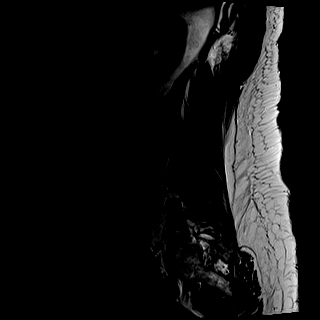
[im 3/17]
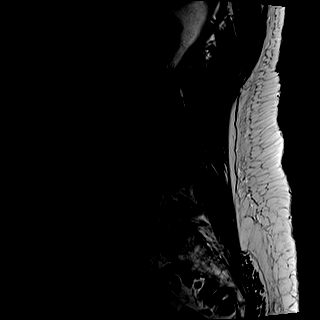
[im 5/17]
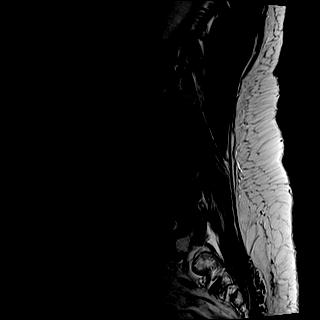
[im 7/17]
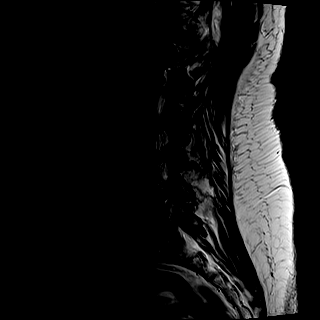
[im 10/17]
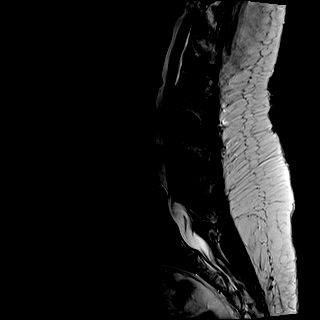
[im 12/17]
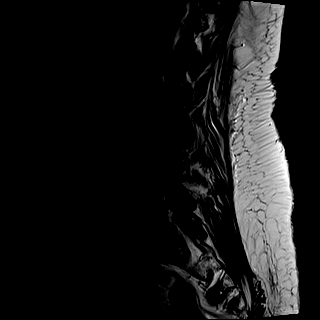
[im 14/17]
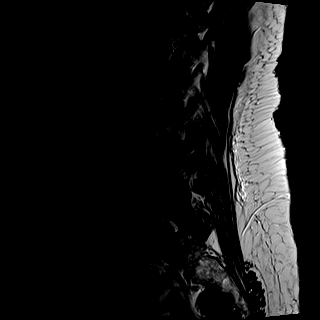
[im 17/17]
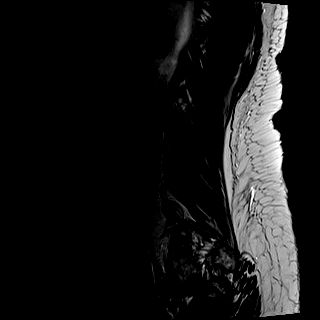

[Series 6: T1 · sagittal · 4.0mm · 0.81mm/px · 7 of 17 slices shown (1 of 2)]
[im 1/17]
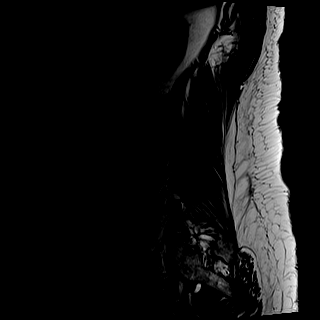
[im 3/17]
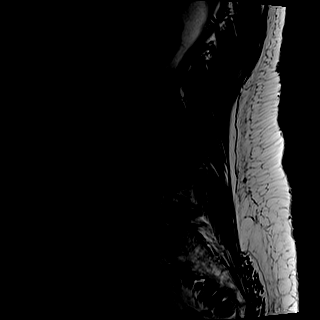
[im 6/17]
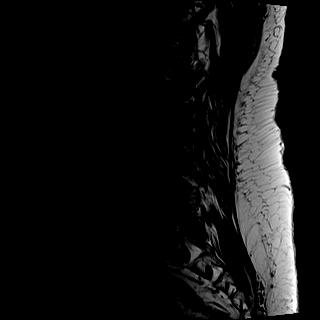
[im 9/17]
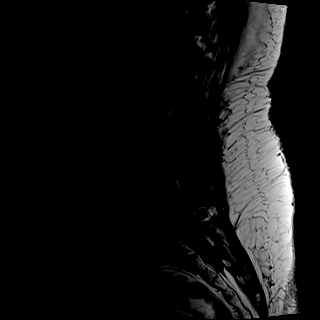
[im 11/17]
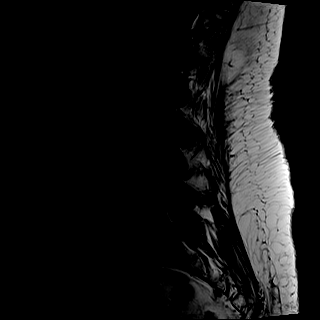
[im 14/17]
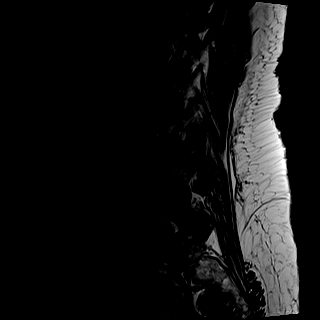
[im 17/17]
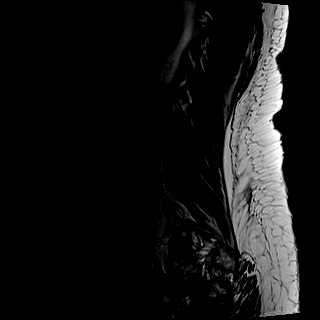

[Series 8: T2 · axial · 4.0mm · 0.78mm/px · z∈[+10,+210]mm · 9 of 32 slices shown (2 of 2)]
[im 1/32]
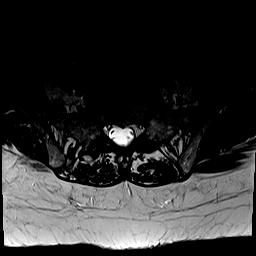
[im 6/32]
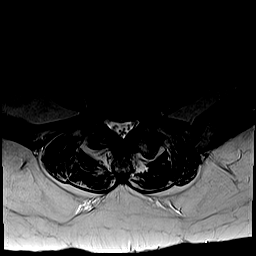
[im 11/32]
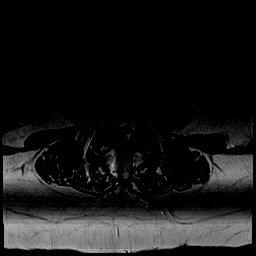
[im 13/32]
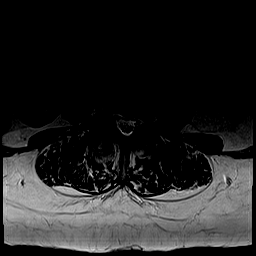
[im 16/32]
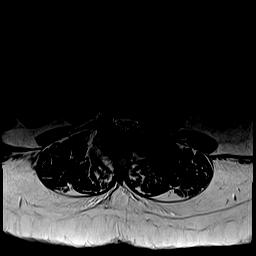
[im 19/32]
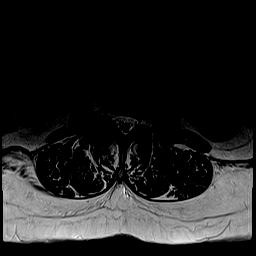
[im 21/32]
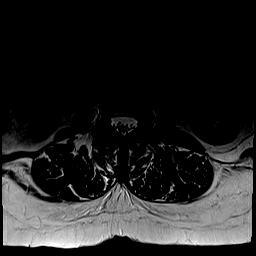
[im 26/32]
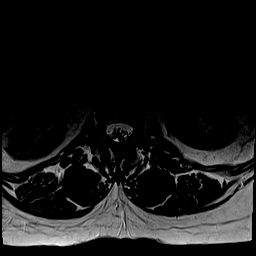
[im 32/32]
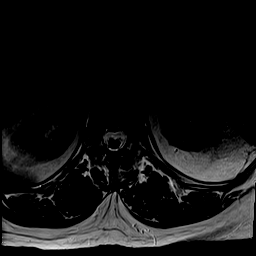

[Series 9: T1 · axial · 4.0mm · 0.39mm/px · z∈[+10,+210]mm · 9 of 32 slices shown (2 of 2)]
[im 1/32]
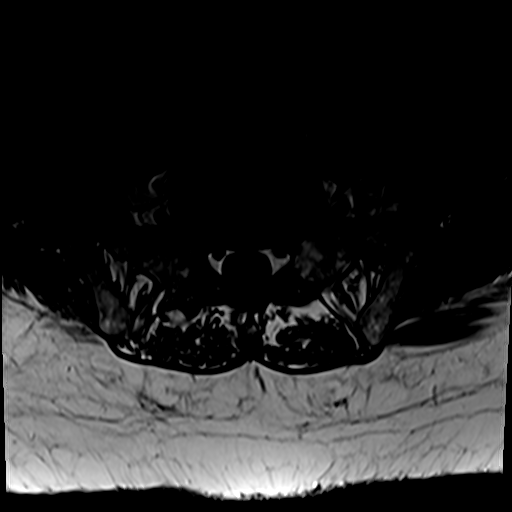
[im 6/32]
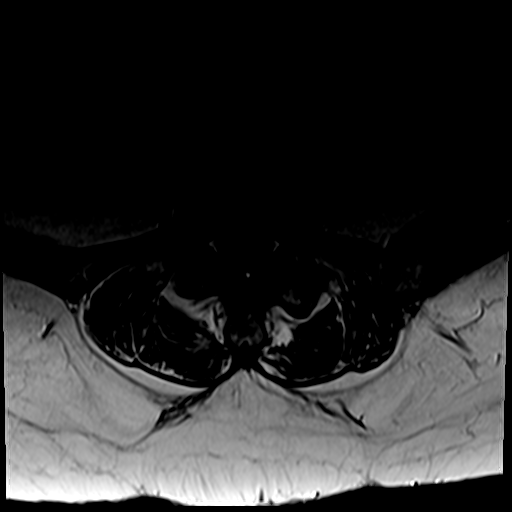
[im 11/32]
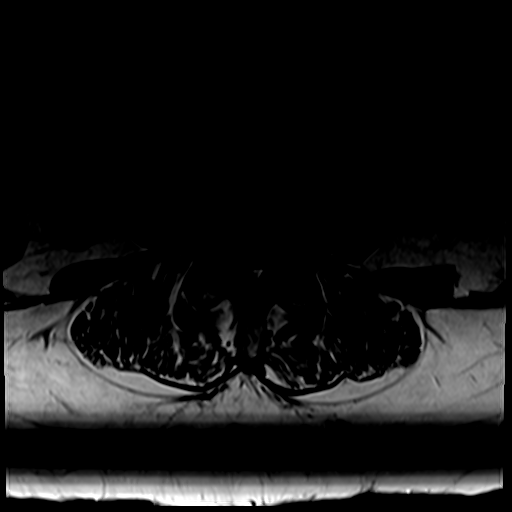
[im 13/32]
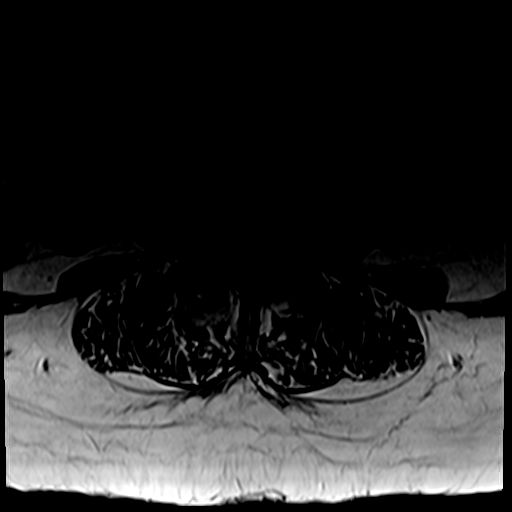
[im 16/32]
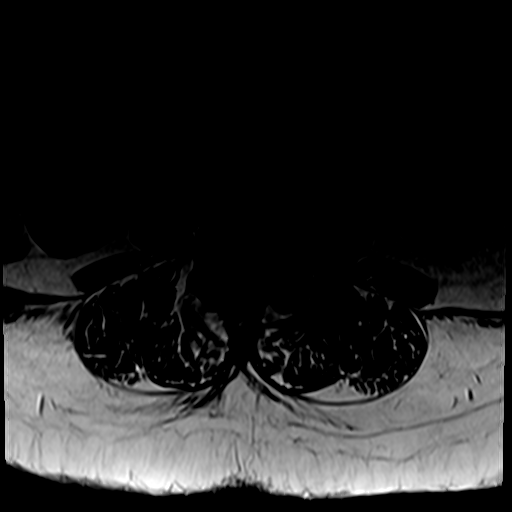
[im 19/32]
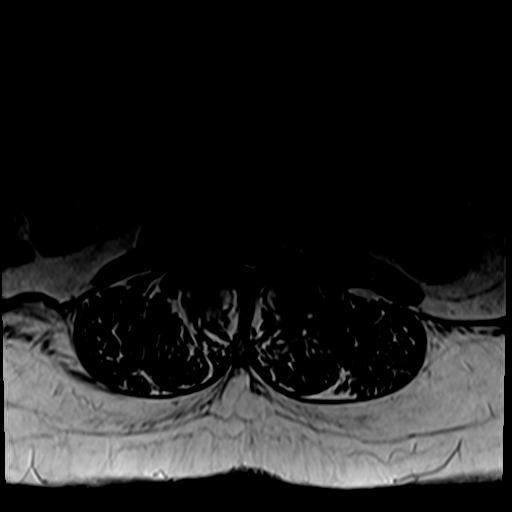
[im 21/32]
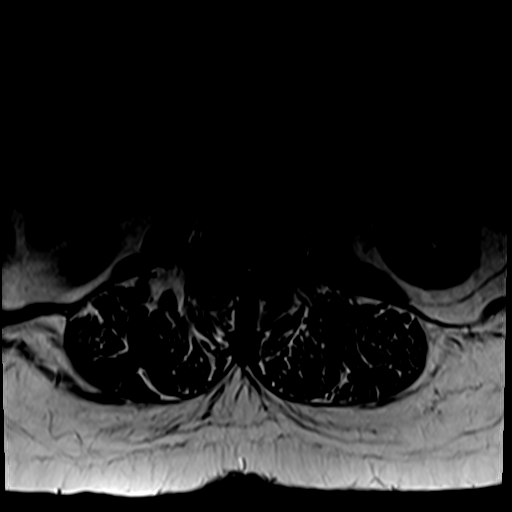
[im 26/32]
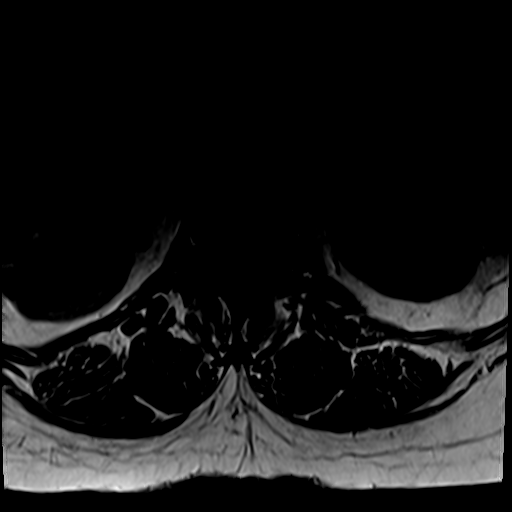
[im 32/32]
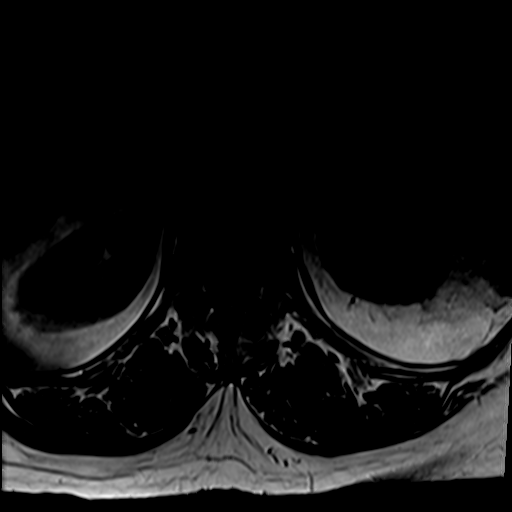

[33 of 48 positions shown; findings below may reference images not displayed]

FINDINGS: Segmentation:  Standard.

Alignment:  Trace L2-3 anterolisthesis.  Trace L1-2 retrolisthesis.

Vertebrae: Multilevel Modic type 2 endplate degenerative changes. No
fracture or aggressive osseous lesion.

Conus medullaris and cauda equina: Conus extends to the L1 level.
Conus and cauda equina appear normal.

Disc levels: Multilevel desiccation and Schmorl's node formation.

T12-L1: Minimal disc bulge and bilateral facet hypertrophy.
Prominent ligamentum flavum. Patent spinal canal and neural foramen.

L1-2: Disc bulge with superimposed right foraminal protrusion.
Ligamentum flavum thickening and bilateral facet hypertrophy. Mild
spinal canal, moderate right and mild left neural foraminal
narrowing.

L2-3: Disc bulge, ligamentum flavum thickening and bilateral facet
hypertrophy. Moderate spinal canal and mild right neural foraminal
narrowing. Patent left neural foramen.

L3-4: Disc bulge, prominent ligamentum flavum and bilateral facet
hypertrophy. Mild-to-moderate spinal canal, moderate right greater
than left neural foraminal narrowing.

L4-5: Disc bulge with shallow right extraforaminal protrusion.
Ligamentum flavum thickening and bilateral facet hypertrophy.
Mild-to-moderate spinal canal, mild right and moderate to severe
left neural foraminal narrowing.

L5-S1: Mild disc bulge and ligamentum flavum thickening. Bilateral
facet hypertrophy. There is abutment of the exiting right L5 and
descending bilateral S1 nerve roots. Patent spinal canal. Mild right
and moderate left neural foraminal narrowing.

Paraspinal and other soft tissues: Negative.
IMPRESSION: No acute/subacute fracture.  Multilevel spondylosis.

Moderate spinal canal narrowing at the L2-3, L3-4, L4-5 levels.

Moderate right L1-2, bilateral L3-4, mild left L5-S1 neural
foraminal narrowing.

Moderate to severe left L4-5 neural foraminal narrowing.

## 2020-06-12 ENCOUNTER — Other Ambulatory Visit: Payer: Self-pay | Admitting: Family Medicine

## 2020-06-12 DIAGNOSIS — Z1231 Encounter for screening mammogram for malignant neoplasm of breast: Secondary | ICD-10-CM

## 2020-07-04 ENCOUNTER — Other Ambulatory Visit (INDEPENDENT_AMBULATORY_CARE_PROVIDER_SITE_OTHER): Payer: Self-pay | Admitting: Nurse Practitioner

## 2020-07-04 ENCOUNTER — Telehealth (INDEPENDENT_AMBULATORY_CARE_PROVIDER_SITE_OTHER): Payer: Self-pay | Admitting: Vascular Surgery

## 2020-07-04 NOTE — Telephone Encounter (Signed)
Called to be scheduled for CT results.  Patient was last seen 08/2019.   Jd/fb to put in order for patient. Patient to call office once she has been scheduled.  This notes is for documentation purposes.

## 2020-07-04 NOTE — Telephone Encounter (Signed)
Called stating that it time

## 2020-07-25 ENCOUNTER — Ambulatory Visit
Admission: RE | Admit: 2020-07-25 | Discharge: 2020-07-25 | Disposition: A | Discharge status: Home / Self Care | Payer: Medicare Other | Source: Ambulatory Visit | Attending: Family Medicine | Admitting: Family Medicine

## 2020-07-25 ENCOUNTER — Other Ambulatory Visit: Payer: Self-pay

## 2020-07-25 DIAGNOSIS — Z1231 Encounter for screening mammogram for malignant neoplasm of breast: Secondary | ICD-10-CM | POA: Insufficient documentation

## 2020-07-25 IMAGING — MG MM DIGITAL SCREENING BILAT W/ TOMO AND CAD
8 series · 8 of 24 positions shown · non-contrast
Comparison: Previous exam(s).

CLINICAL DATA: Screening.

EXAM:
DIGITAL SCREENING BILATERAL MAMMOGRAM WITH TOMOSYNTHESIS AND CAD
TECHNIQUE: Bilateral screening digital craniocaudal and mediolateral oblique
mammograms were obtained. Bilateral screening digital breast
tomosynthesis was performed. The images were evaluated with
computer-aided detection.

[L MLO synth-2D]
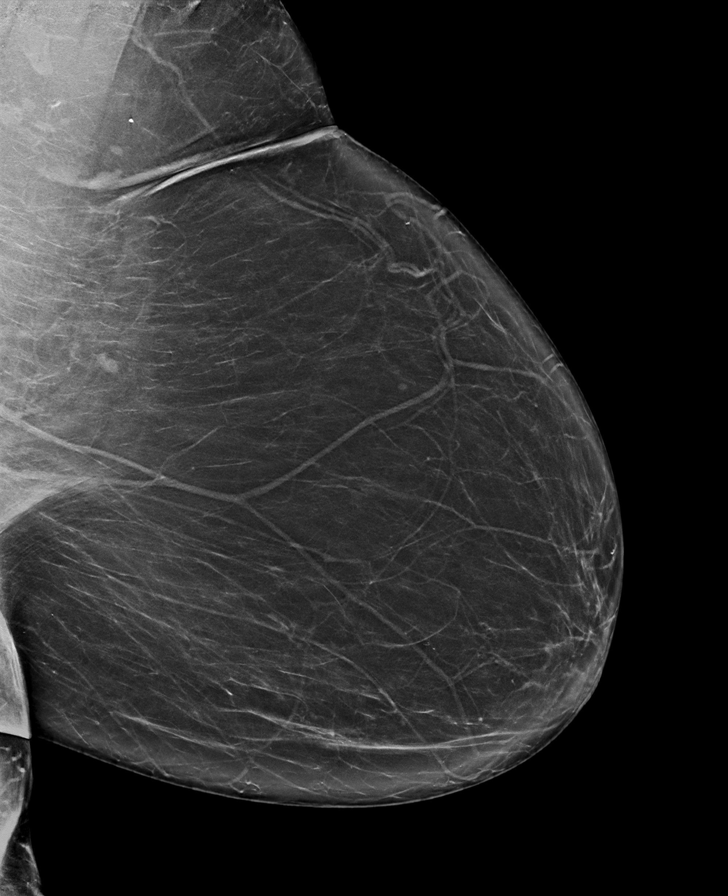

[R MLO synth-2D]
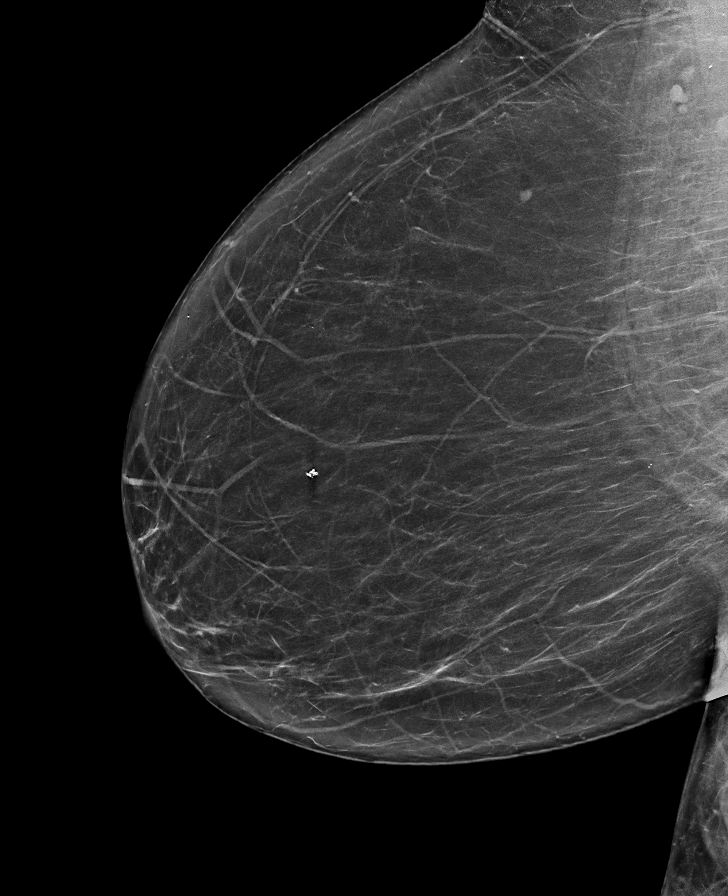

[R CC synth-2D]
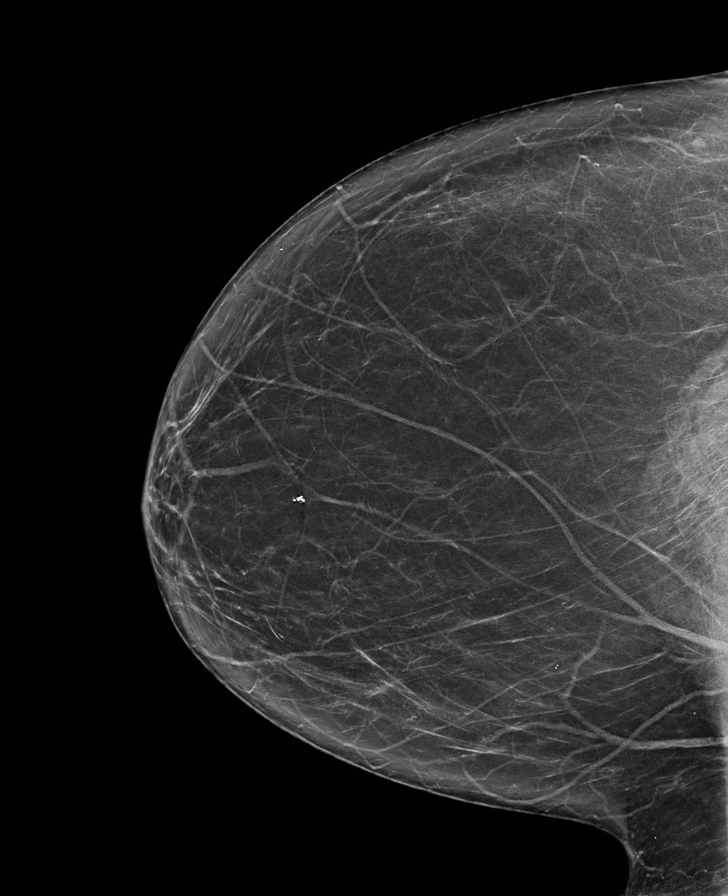

[L CC synth-2D]
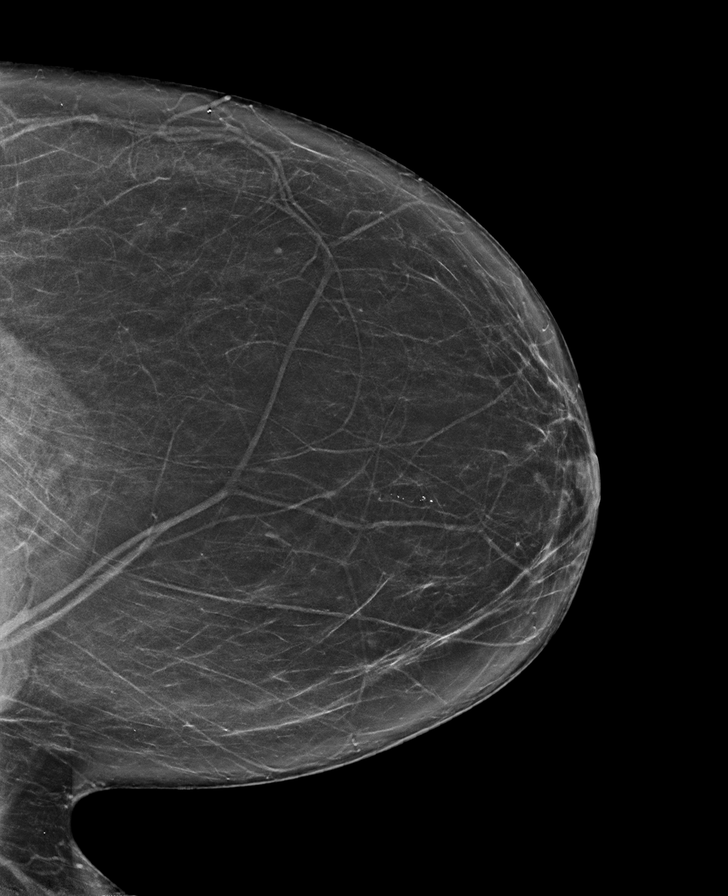

[L CC tomo · tomo slice 36/71.0]
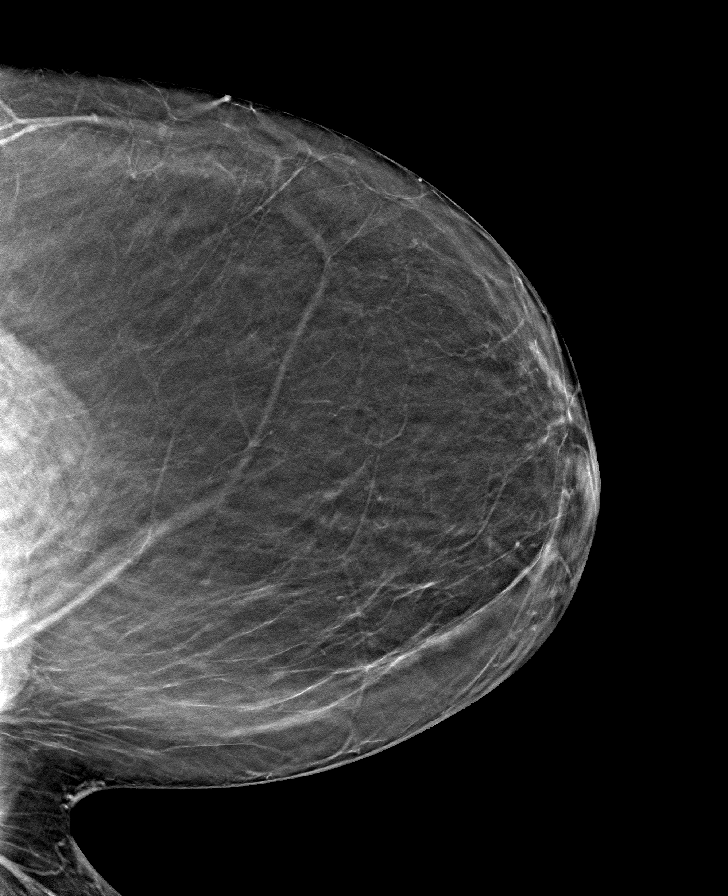

[R MLO tomo · tomo slice 37/74.0]
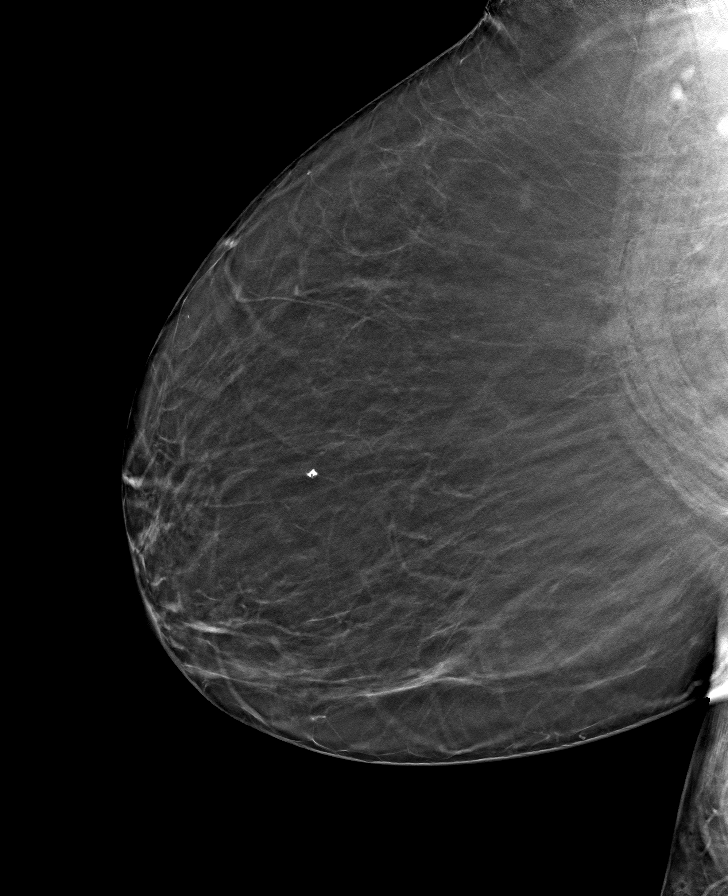

[R CC tomo · tomo slice 37/72.0]
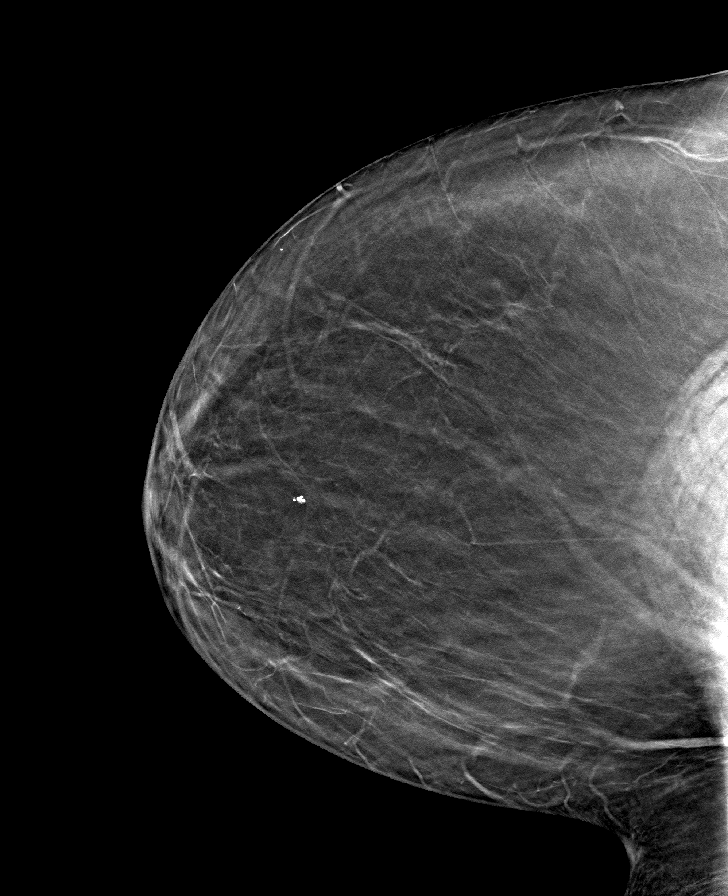

[L MLO tomo · tomo slice 41/81.0]
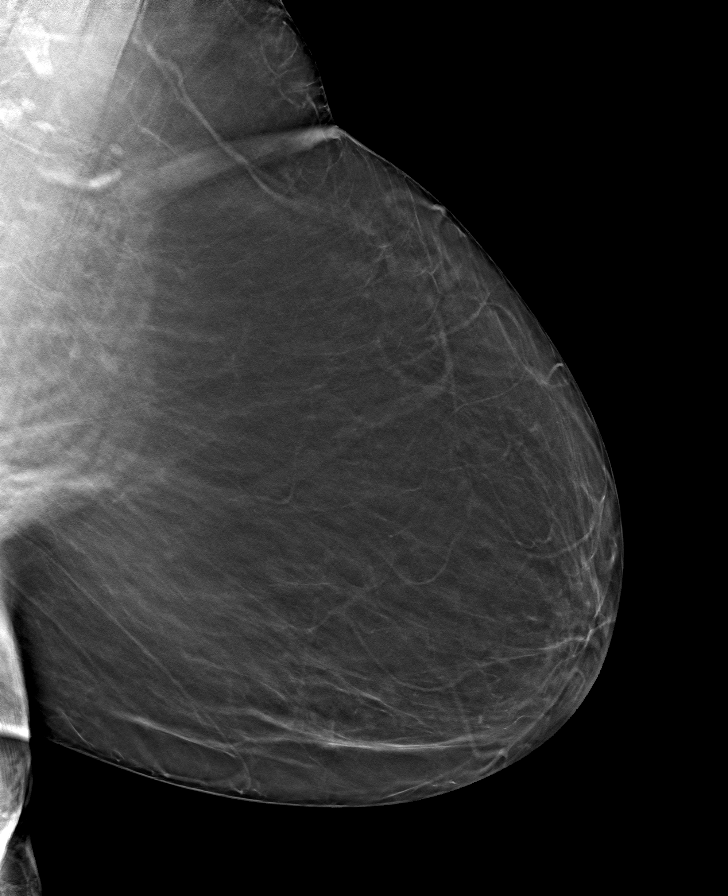

[8 of 24 positions shown; findings below may reference images not displayed]

ACR Breast Density Category b: There are scattered areas of
fibroglandular density.
FINDINGS: There are no findings suspicious for malignancy.
IMPRESSION: No mammographic evidence of malignancy. A result letter of this
screening mammogram will be mailed directly to the patient.

RECOMMENDATION:
Screening mammogram in one year. (Code:[BY])

BI-RADS CATEGORY  1: Negative.

## 2020-07-27 ENCOUNTER — Telehealth (INDEPENDENT_AMBULATORY_CARE_PROVIDER_SITE_OTHER): Payer: Self-pay | Admitting: Vascular Surgery

## 2020-07-27 NOTE — Telephone Encounter (Signed)
Patient called in stating she was supposed to be set up for CT scan per Dr. Lucky Cowboy.  She hasn't heard anything.  Please advise.

## 2020-07-27 NOTE — Telephone Encounter (Signed)
Patient was giving number to schedule CT and I informed the patient to contact our office once she receives a date to schedule follow up with Dr Lucky Cowboy

## 2020-08-14 ENCOUNTER — Ambulatory Visit
Admission: RE | Admit: 2020-08-14 | Discharge: 2020-08-14 | Disposition: A | Discharge status: Home / Self Care | Payer: Medicare Other | Source: Ambulatory Visit | Attending: Vascular Surgery | Admitting: Vascular Surgery

## 2020-08-14 ENCOUNTER — Other Ambulatory Visit: Payer: Self-pay

## 2020-08-14 DIAGNOSIS — I712 Thoracic aortic aneurysm, without rupture: Secondary | ICD-10-CM | POA: Diagnosis present

## 2020-08-14 DIAGNOSIS — I728 Aneurysm of other specified arteries: Secondary | ICD-10-CM | POA: Insufficient documentation

## 2020-08-14 DIAGNOSIS — I7121 Aneurysm of the ascending aorta, without rupture: Secondary | ICD-10-CM

## 2020-08-14 LAB — POCT I-STAT CREATININE: Creatinine, Ser: 1.1 mg/dL — ABNORMAL HIGH (ref 0.44–1.00)

## 2020-08-14 IMAGING — CT CT ANGIO CHEST
2 of 6 series · 13 of 36 positions shown · IV contrast (omnipaque)
Comparison: Chest CT-[DATE]; [DATE]

Neck CT-[DATE]; [DATE]

CT abdomen pelvis-[DATE]; [DATE]

CLINICAL DATA: Evaluate subclavian aneurysm.

EXAM:
CT ANGIOGRAPHY CHEST WITH CONTRAST
TECHNIQUE: Multidetector CT imaging of the chest was performed using the
standard protocol during bolus administration of intravenous
contrast. Multiplanar CT image reconstructions and MIPs were
obtained to evaluate the vascular anatomy.
CONTRAST:  75mL OMNIPAQUE IOHEXOL 350 MG/ML SOLN

[Series 4: axial arterial cta thorax 2.00 · axial · arterial · 0.66mm/px · z∈[-1141,-915]mm · 12 of 135 slices shown]
[im 11/135  lung]
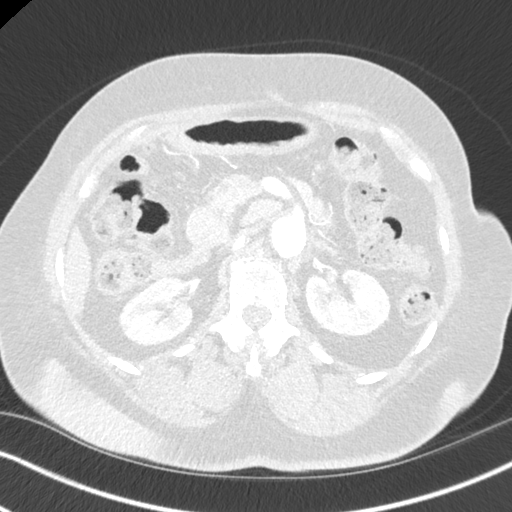
[im 21/135  mediastinal]
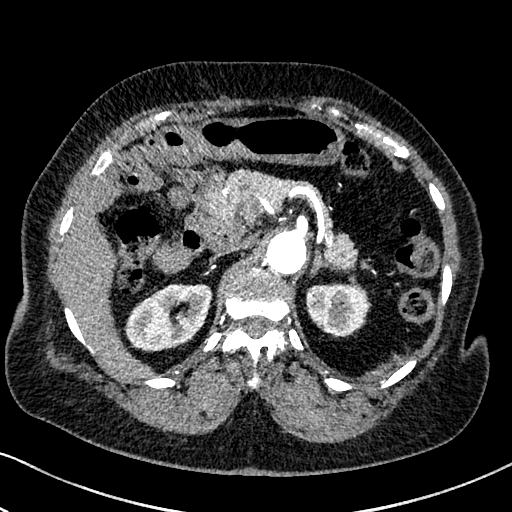
[im 31/135  lung]
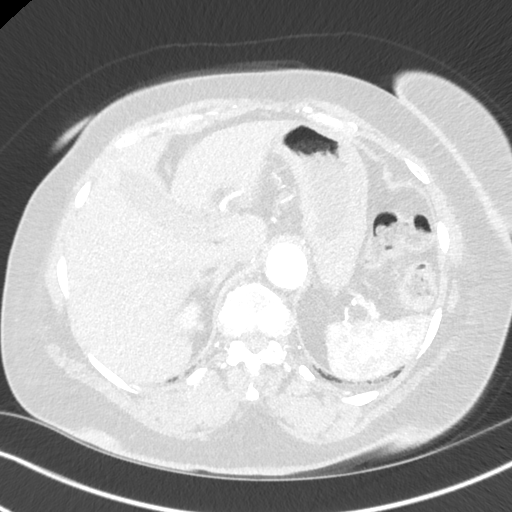
[im 42/135  mediastinal]
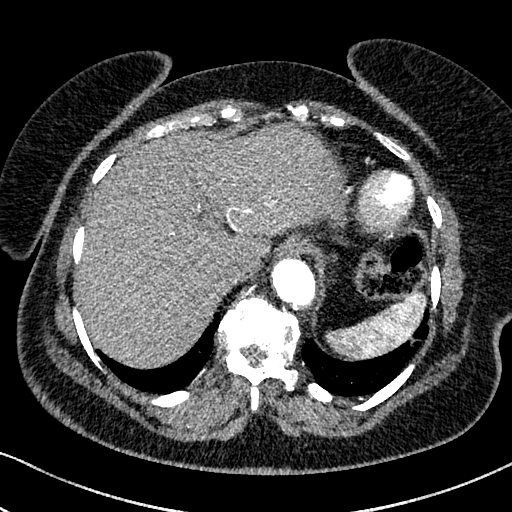
[im 52/135  lung]
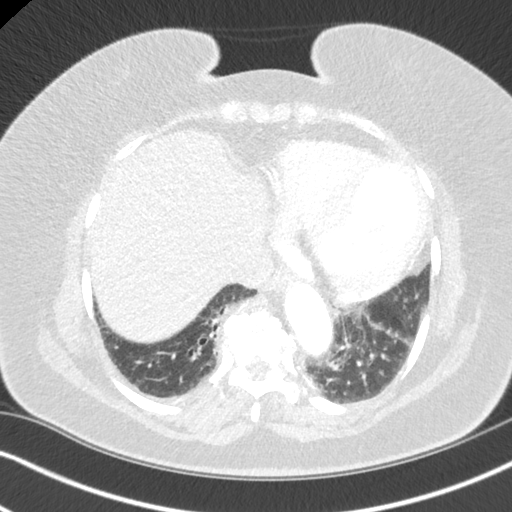
[im 62/135  mediastinal]
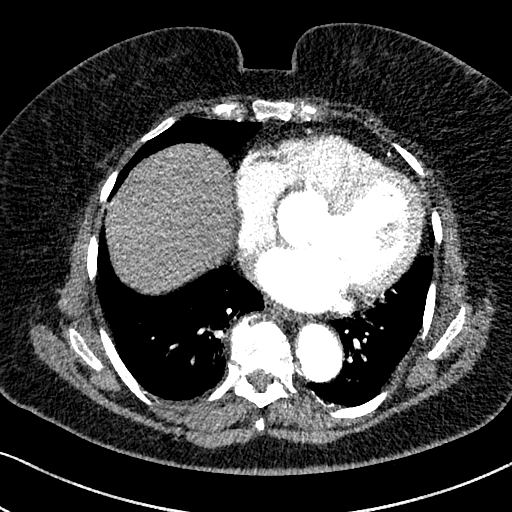
[im 73/135  lung]
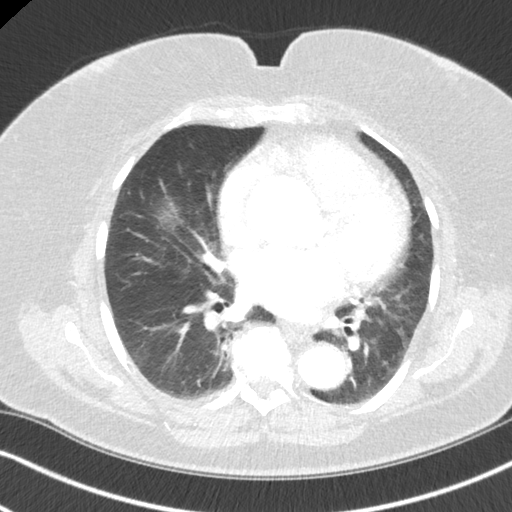
[im 83/135  mediastinal]
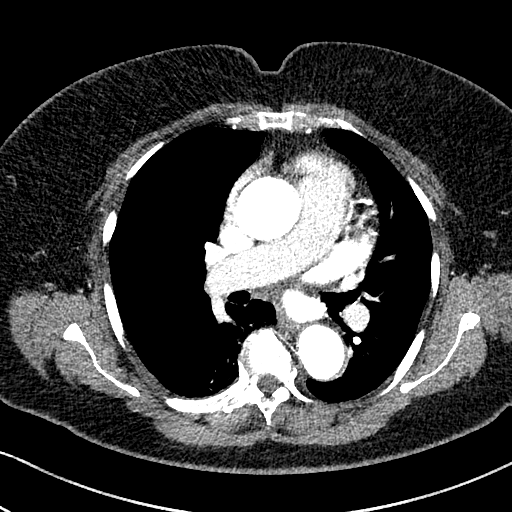
[im 93/135  lung]
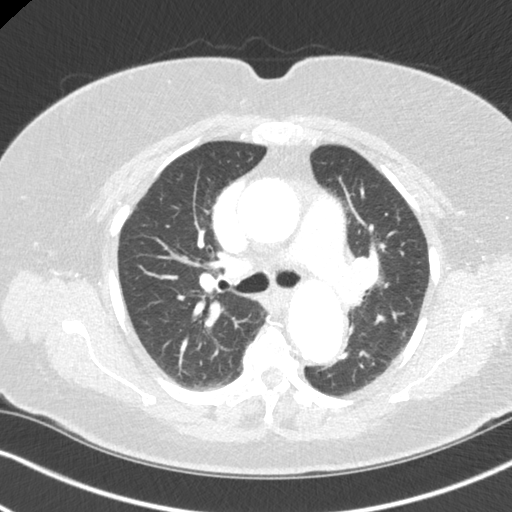
[im 104/135  mediastinal]
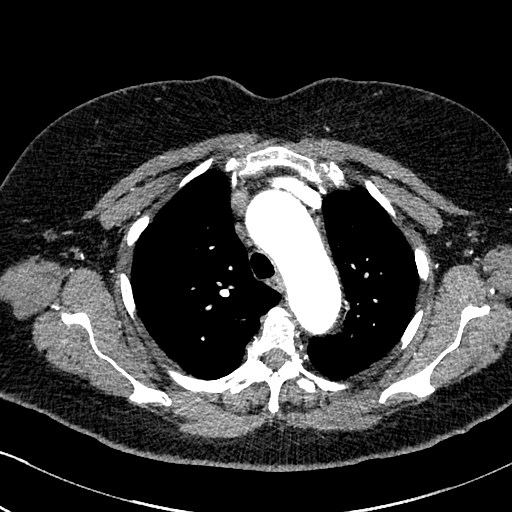
[im 114/135  lung]
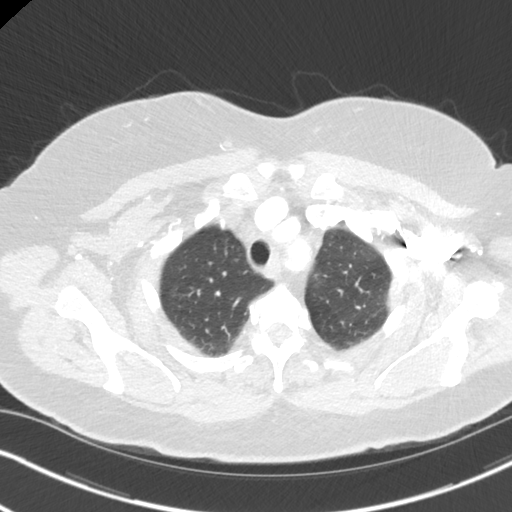
[im 124/135  mediastinal]
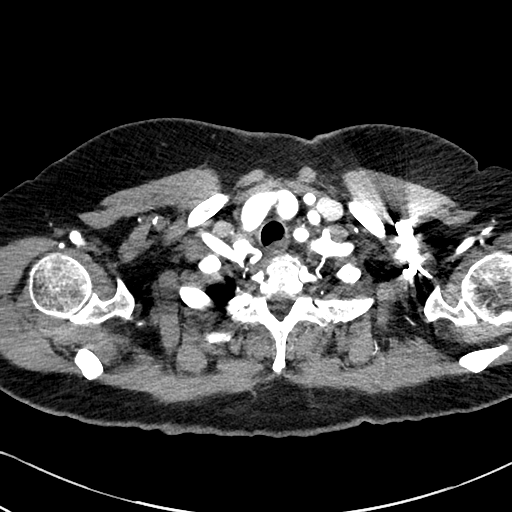

[Series 7: cor st cta thorax 2.00 cor · coronal · 0.53mm/px · 1 of 148 slices shown]
[im 74/148  mediastinal]
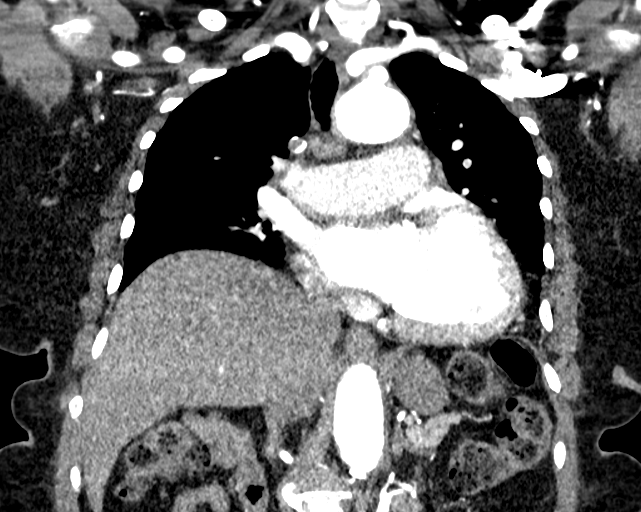

[13 of 36 positions shown; findings below may reference images not displayed]

FINDINGS: Vascular Findings:

Redemonstrated aneurysmal dilatation of the ascending thoracic
aorta. While the thoracic aorta for tapers to a normal caliber at
the level of the aortic arch, note is again made of a ductus
diverticulum with fusiform aneurysmal dilatation involving the
proximal most aspect of the descending thoracic aorta. The remainder
of the descending thoracic aorta is tortuous though of normal
caliber and without hemodynamically significant narrowing.

Conventional configuration of the aortic arch. Redemonstrated
uncomplicated aneurysmal dilatation involving the right
brachiocephalic artery measuring 1.9 x 1.7 cm as measured in
greatest oblique short axis sagittal (image 92, series 9) and
coronal (image 58, series 7), dimensions respectively. Uncomplicated
mild aneurysmal dilatation of the right subclavian artery measuring
1.5 cm and maximal oblique short access coronal diameter (image 62,
series 7). Both are unchanged compared to the [JX] examination. The
branch vessels of the aortic arch are tortuous though all appear
patent without hemodynamically significant narrowing throughout
their imaged courses.

Cardiomegaly.  No pericardial effusion.

Although this examination was not tailored for the evaluation the
pulmonary arteries, there are no discrete filling defects within the
central pulmonary arterial tree to suggest central pulmonary
embolism. Stable enlargement of the caliber the main pulmonary
artery measuring 35 mm in diameter (image 48, series 4).

-------------------------------------------------------------

Thoracic aortic measurements:

SINOTUBULAR JUNCTION: 35 mm as measured in greatest oblique short
axis coronal dimension.

PROXIMAL ASCENDING THORACIC AORTA: 41 mm as measured in greatest
oblique short axis axial dimension at the level of the main
pulmonary artery (image 52, series 4) and approximately 42 mm in
greatest oblique short axis coronal diameter (coronal image 59,
series 7), grossly unchanged compared to the [JX] examination

AORTIC ARCH: 31 mm as measured in greatest oblique short axis
sagittal dimension (sagittal image 101, series 9).

PROXIMAL DESCENDING THORACIC AORTA: 41 mm as measured in greatest
oblique short access axial diameter (image 38, series 4) and
approximately 42 mm as measured in greatest oblique short axis
sagittal diameter (sagittal image 106, series 9) as measured at the
level of the ductus diverticulum, unchanged, grossly unchanged
compared to the [JX] examination. The remainder of the descending
thoracic aorta is of normal caliber with the descending thoracic
aorta measuring 31 mm as measured in greatest oblique short axis
axial dimension at the level of the main pulmonary artery.

DISTAL DESCENDING THORACIC AORTA: 29 mm as measured in greatest
oblique short axis axial dimension at the level of the diaphragmatic
hiatus.

Review of the MIP images confirms the above findings.

-------------------------------------------------------------

Non-Vascular Findings:

Mediastinum/Lymph Nodes: No bulky mediastinal, hilar or axillary
lymphadenopathy.

Lungs/Pleura: Minimal dependent subpleural ground-glass atelectasis.
Redemonstrated volume loss and bronchiectasis involving the
posterior basilar segment of the right lower lobe, similar to
abdominal CT performed [DATE]. No focal airspace opacities. No
pleural effusion or pneumothorax. The central pulmonary airways
appear widely patent

No new or enlarging pulmonary nodules. Punctate (3 mm) left lower
lobe subpleural pulmonary nodule (image 77, series 5), is unchanged
compared to the [DATE] examination

Upper abdomen: Limited early arterial phase evaluation of the upper
abdomen demonstrates atherosclerotic plaque within the abdominal
aorta.

Musculoskeletal: No acute or aggressive osseous abnormalities.
Accentuated thoracic kyphosis with unchanged mild (approximately
25%) compression deformities of multiple adjacent midthoracic
vertebral bodies, similar to the [DATE] examination. Moderate to
severe multilevel thoracic spine DDD. Stigmata of dish within the
mid and caudal aspects of the thoracic spine. Regional soft tissues
appear normal. Normal appearance of the imaged portions of the
thyroid gland.
IMPRESSION: 1. Stable uncomplicated fusiform aneurysmal dilatation of the
ascending and proximal descending thoracic aorta measuring 42 mm in
diameter at both locations, grossly unchanged compared to the [JX]
examination.
2. Stable uncomplicated fusiform aneurysmal dilatation of the right
brachiocephalic and subclavian arteries measuring 1.9 cm and 1.5 cm
in diameter respectively, unchanged compared to the [JX]
examination.
3. Stable cardiomegaly with enlargement of the caliber of the main
pulmonary artery, nonspecific though could be seen in the setting
pulmonary arterial hypertension.
4. Punctate (3 mm) left lower lobe pulmonary nodules unchanged
compared to the [DATE] examination. Follow-up examination after
[DATE] would ensure 2 years of stability and thus a benign
etiology.

## 2020-08-14 MED ORDER — IOHEXOL 350 MG/ML SOLN
75.0000 mL | Freq: Once | INTRAVENOUS | Status: AC | PRN
  Administered 2020-08-14: 75 mL via INTRAVENOUS

## 2020-08-25 ENCOUNTER — Ambulatory Visit (INDEPENDENT_AMBULATORY_CARE_PROVIDER_SITE_OTHER): Payer: Medicare Other | Admitting: Vascular Surgery

## 2020-08-29 ENCOUNTER — Encounter (INDEPENDENT_AMBULATORY_CARE_PROVIDER_SITE_OTHER): Payer: Self-pay | Admitting: Vascular Surgery

## 2020-08-29 ENCOUNTER — Other Ambulatory Visit: Payer: Self-pay

## 2020-08-29 ENCOUNTER — Ambulatory Visit (INDEPENDENT_AMBULATORY_CARE_PROVIDER_SITE_OTHER): Payer: Medicare Other | Admitting: Vascular Surgery

## 2020-08-29 VITALS — BP 154/85 | HR 67 | Resp 16 | Wt 181.4 lb

## 2020-08-29 DIAGNOSIS — I10 Essential (primary) hypertension: Secondary | ICD-10-CM | POA: Diagnosis not present

## 2020-08-29 DIAGNOSIS — E785 Hyperlipidemia, unspecified: Secondary | ICD-10-CM | POA: Diagnosis not present

## 2020-08-29 DIAGNOSIS — I728 Aneurysm of other specified arteries: Secondary | ICD-10-CM | POA: Diagnosis not present

## 2020-08-29 DIAGNOSIS — I712 Thoracic aortic aneurysm, without rupture: Secondary | ICD-10-CM | POA: Diagnosis not present

## 2020-08-29 DIAGNOSIS — I7121 Aneurysm of the ascending aorta, without rupture: Secondary | ICD-10-CM

## 2020-08-29 NOTE — Assessment & Plan Note (Signed)
I have independently reviewed her CT angiogram and reviewed this with her today.  Her thoracic aorta is stable measuring 4.1 to 4.2 cm in maximal diameter.  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  Stable.  No role for repair or intervention at this size and it has been the size for many years.  Recheck in 1 year with CT scan.

## 2020-08-29 NOTE — Progress Notes (Signed)
MRN : [id number]  Carolyn Woods is a 80 y.o. (1940/03/30) female who presents with chief complaint of  Chief Complaint  Patient presents with   Follow-up    Ct results  .  History of Present Illness: Patient returns today in follow up of thoracic aortic and brachiocephalic artery aneurysms.  She is doing well.  She has no new complaints today.  No aneurysm related symptoms of chest pain, back pain, or signs of peripheral embolization.  No focal neurologic symptoms. Specifically, the patient denies amaurosis fugax, speech or swallowing difficulties, or arm or leg weakness or numbness.  I have independently reviewed her CT angiogram and reviewed this with her today.  Her thoracic aorta is stable measuring 4.1 to 4.2 cm in maximal diameter.  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  Current Outpatient Medications  Medication Sig Dispense Refill   amLODipine (NORVASC) 2.5 MG tablet Take 2.5 mg by mouth.     aspirin EC 81 MG tablet Take 81 mg by mouth daily.     atenolol (TENORMIN) 25 MG tablet Take by mouth.     conjugated estrogens (PREMARIN) vaginal cream Place vaginally.     estradiol (ESTRACE) 1 MG tablet Take 1 mg by mouth 2 (two) times a week.     gabapentin (NEURONTIN) 100 MG capsule Take 100 mg by mouth at bedtime.     lubiprostone (AMITIZA) 8 MCG capsule Take 8 mcg by mouth 2 (two) times daily with a meal.      NONFORMULARY OR COMPOUNDED ITEM See pharmacy note 120 each 2   pantoprazole (PROTONIX) 40 MG tablet TAKE 1 TABLET BY MOUTH TWICE DAILY     simvastatin (ZOCOR) 20 MG tablet Take 20 mg by mouth.     Vitamin D, Ergocalciferol, (DRISDOL) 50000 units CAPS capsule TK ONE C PO ONCE A WEEK     No current facility-administered medications for this visit.    Past Medical History:  Diagnosis Date   Anemia    Arthritis    Candidiasis of the genitals, female    Cervical cancer [facility])    Hysterectomy.   Cystitis     Diverticulosis    Duodenal adenoma    Gastritis    Hyperlipidemia    Hypertension    Incontinence overflow, stress female    Sciatica    Tubular adenoma     Past Surgical History:  Procedure Laterality Date   ABDOMINAL HYSTERECTOMY     COLONOSCOPY  02/20/10   ERCP     ESOPHAGOGASTRODUODENOSCOPY (EGD) WITH PROPOFOL N/A 10/25/2014   Procedure: ESOPHAGOGASTRODUODENOSCOPY (EGD) WITH PROPOFOL;  Surgeon: Lollie Sails, MD;  Location: [facility] ENDOSCOPY;  Service: Endoscopy;  Laterality: N/A;   ESOPHAGOGASTRODUODENOSCOPY (EGD) WITH PROPOFOL N/A 12/05/2015   Procedure: ESOPHAGOGASTRODUODENOSCOPY (EGD) WITH PROPOFOL;  Surgeon: Lollie Sails, MD;  Location: [facility] ENDOSCOPY;  Service: Endoscopy;  Laterality: N/A;     Social History   Tobacco Use   Smoking status: Never   Smokeless tobacco: Never  Substance Use Topics   Alcohol use: No    Alcohol/week: 1.0 standard drink    Types: 1 Glasses of wine per week   Drug use: No       Family History  Problem Relation Age of Onset   Breast cancer Sister      Allergies  Allergen Reactions   Linzess [Linaclotide]    Codeine Rash     REVIEW OF SYSTEMS (Negative unless checked)  Constitutional: '[]'$ Weight  loss  '[]'$ Fever  '[]'$ Chills Cardiac: '[]'$ Chest pain   '[]'$ Chest pressure   '[]'$ Palpitations   '[]'$ Shortness of breath when laying flat   '[]'$ Shortness of breath at rest   '[]'$ Shortness of breath with exertion. Vascular:  '[]'$ Pain in legs with walking   '[]'$ Pain in legs at rest   '[]'$ Pain in legs when laying flat   '[]'$ Claudication   '[]'$ Pain in feet when walking  '[]'$ Pain in feet at rest  '[]'$ Pain in feet when laying flat   '[]'$ History of DVT   '[]'$ Phlebitis   '[]'$ Swelling in legs   '[]'$ Varicose veins   '[]'$ Non-healing ulcers Pulmonary:   '[]'$ Uses home oxygen   '[]'$ Productive cough   '[]'$ Hemoptysis   '[]'$ Wheeze  '[]'$ COPD   '[]'$ Asthma Neurologic:  '[]'$ Dizziness  '[]'$ Blackouts   '[]'$ Seizures   '[]'$ History of stroke   '[]'$ History of TIA  '[]'$ Aphasia   '[]'$ Temporary blindness   '[]'$ Dysphagia   '[]'$ Weakness  or numbness in arms   '[]'$ Weakness or numbness in legs Musculoskeletal:  '[x]'$ Arthritis   '[]'$ Joint swelling   '[x]'$ Joint pain   '[]'$ Low back pain Hematologic:  '[]'$ Easy bruising  '[]'$ Easy bleeding   '[]'$ Hypercoagulable state   '[x]'$ Anemic   Gastrointestinal:  '[]'$ Blood in stool   '[]'$ Vomiting blood  '[x]'$ Gastroesophageal reflux/heartburn   '[]'$ Abdominal pain Genitourinary:  '[]'$ Chronic kidney disease   '[]'$ Difficult urination  '[]'$ Frequent urination  '[]'$ Burning with urination   '[]'$ Hematuria Skin:  '[]'$ Rashes   '[]'$ Ulcers   '[]'$ Wounds Psychological:  '[]'$ History of anxiety   '[]'$  History of major depression.  Physical Examination  BP (!) 154/85 (BP Location: Right Arm)   Pulse 67   Resp 16   Wt 181 lb 6.4 oz (82.3 kg)   BMI 33.18 kg/m  Gen:  WD/WN, NAD.  Appears younger than stated age Head: [place]/AT, No temporalis wasting. Ear/Nose/Throat: Hearing grossly intact, nares w/o erythema or drainage Eyes: Conjunctiva clear. Sclera non-icteric Neck: Supple.  Trachea midline.  Right carotid artery is prominent and visibly pulsatile Pulmonary:  Good air movement, no use of accessory muscles.  Cardiac: RRR, no JVD Vascular:  Vessel Right Left  Radial Palpable Palpable       Musculoskeletal: M/S 5/5 throughout.  No deformity or atrophy.  Trace lower extremity edema. Neurologic: Sensation grossly intact in extremities.  Symmetrical.  Speech is fluent.  Psychiatric: Judgment intact, Mood & affect appropriate for pt's clinical situation. Dermatologic: No rashes or ulcers noted.  No cellulitis or open wounds.      Labs Recent Results (from the past 2160 hour(s))  I-STAT creatinine     Status: Abnormal   Collection Time: 08/14/20 10:25 AM  Result Value Ref Range   Creatinine, Ser 1.10 (H) 0.44 - 1.00 mg/dL    Radiology CT ANGIO CHEST AORTA W/CM & OR WO/CM  Result Date: 08/14/2020 CLINICAL DATA:  Evaluate subclavian aneurysm. EXAM: CT ANGIOGRAPHY CHEST WITH CONTRAST TECHNIQUE: Multidetector CT imaging of the chest was performed  using the standard protocol during bolus administration of intravenous contrast. Multiplanar CT image reconstructions and MIPs were obtained to evaluate the vascular anatomy. CONTRAST:  71m OMNIPAQUE IOHEXOL 350 MG/ML SOLN COMPARISON:  Chest CT-09/07/2019; 10/29/2018 Neck CT-10/09/2017; 10/04/2011 CT abdomen pelvis-10/08/2014; 09/30/2008 FINDINGS: Vascular Findings: Redemonstrated aneurysmal dilatation of the ascending thoracic aorta. While the thoracic aorta for tapers to a normal caliber at the level of the aortic arch, note is again made of a ductus diverticulum with fusiform aneurysmal dilatation involving the proximal most aspect of the descending thoracic aorta. The remainder of the descending thoracic aorta is tortuous though of normal caliber and  without hemodynamically significant narrowing. Conventional configuration of the aortic arch. Redemonstrated uncomplicated aneurysmal dilatation involving the right brachiocephalic artery measuring 1.9 x 1.7 cm as measured in greatest oblique short axis sagittal (image 92, series 9) and coronal (image 58, series 7), dimensions respectively. Uncomplicated mild aneurysmal dilatation of the right subclavian artery measuring 1.5 cm and maximal oblique short access coronal diameter (image 62, series 7). Both are unchanged compared to the 2013 examination. The branch vessels of the aortic arch are tortuous though all appear patent without hemodynamically significant narrowing throughout their imaged courses. Cardiomegaly.  No pericardial effusion. Although this examination was not tailored for the evaluation the pulmonary arteries, there are no discrete filling defects within the central pulmonary arterial tree to suggest central pulmonary embolism. Stable enlargement of the caliber the main pulmonary artery measuring 35 mm in diameter (image 48, series 4). ------------------------------------------------------------- Thoracic aortic measurements: SINOTUBULAR JUNCTION:  35 mm as measured in greatest oblique short axis coronal dimension. PROXIMAL ASCENDING THORACIC AORTA: 41 mm as measured in greatest oblique short axis axial dimension at the level of the main pulmonary artery (image 52, series 4) and approximately 42 mm in greatest oblique short axis coronal diameter (coronal image 59, series 7), grossly unchanged compared to the 2013 examination AORTIC ARCH: 31 mm as measured in greatest oblique short axis sagittal dimension (sagittal image 101, series 9). PROXIMAL DESCENDING THORACIC AORTA: 41 mm as measured in greatest oblique short access axial diameter (image 38, series 4) and approximately 42 mm as measured in greatest oblique short axis sagittal diameter (sagittal image 106, series 9) as measured at the level of the ductus diverticulum, unchanged, grossly unchanged compared to the 2013 examination. The remainder of the descending thoracic aorta is of normal caliber with the descending thoracic aorta measuring 31 mm as measured in greatest oblique short axis axial dimension at the level of the main pulmonary artery. DISTAL DESCENDING THORACIC AORTA: 29 mm as measured in greatest oblique short axis axial dimension at the level of the diaphragmatic hiatus. Review of the MIP images confirms the above findings. ------------------------------------------------------------- Non-Vascular Findings: Mediastinum/Lymph Nodes: No bulky mediastinal, hilar or axillary lymphadenopathy. Lungs/Pleura: Minimal dependent subpleural ground-glass atelectasis. Redemonstrated volume loss and bronchiectasis involving the posterior basilar segment of the right lower lobe, similar to abdominal CT performed 09/28/2014. No focal airspace opacities. No pleural effusion or pneumothorax. The central pulmonary airways appear widely patent No new or enlarging pulmonary nodules. Punctate (3 mm) left lower lobe subpleural pulmonary nodule (image 77, series 5), is unchanged compared to the 10/2018 examination  Upper abdomen: Limited early arterial phase evaluation of the upper abdomen demonstrates atherosclerotic plaque within the abdominal aorta. Musculoskeletal: No acute or aggressive osseous abnormalities. Accentuated thoracic kyphosis with unchanged mild (approximately 25%) compression deformities of multiple adjacent midthoracic vertebral bodies, similar to the 10/2018 examination. Moderate to severe multilevel thoracic spine DDD. 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 Regional soft tissues appear normal. Normal appearance of the imaged portions of the thyroid gland. IMPRESSION: 1. Stable uncomplicated fusiform aneurysmal dilatation of the ascending and proximal descending thoracic aorta measuring 42 mm in diameter at both locations, grossly unchanged compared to the 2013 examination. 2. 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 3. Stable cardiomegaly with enlargement of the caliber of the main pulmonary artery, nonspecific though could be seen in the setting pulmonary arterial hypertension.  4. Punctate (3 mm) left lower lobe pulmonary nodules unchanged compared to the 10/2018 examination. Follow-up examination after 10/2020 would ensure 2 years of stability and thus a benign etiology. Electronically Signed   By: Sandi Mariscal M.D.   On: 08/14/2020 11:12    Assessment/Plan Hypertension blood pressure control important in reducing the progression of atherosclerotic disease and aneurysmal degeneration. On appropriate oral medications.     Hyperlipidemia lipid control important in reducing the progression of atherosclerotic disease. Continue statin therapy  Ascending aortic aneurysm ([place]) I have independently reviewed her CT angiogram and reviewed this with her today.  Her thoracic aorta is stable measuring 4.1 to 4.2  cm in maximal diameter.  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 Stable.  No role for intervention at this size.  Recheck in 1 year.  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  Her thoracic aorta is stable measuring 4.1 to 4.2 cm in maximal diameter.  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  Stable.  No role for repair or intervention at this size and it has been the size for many years.  Recheck in 1 year with CT scan.    Leotis Pain, MD  08/29/2020 11:13 AM    This note was created with Dragon medical transcription system.  Any errors from dictation are purely unintentional

## 2020-08-29 NOTE — Assessment & Plan Note (Signed)
I have independently reviewed her CT angiogram and reviewed this with her today.  Her thoracic aorta is stable measuring 4.1 to 4.2 cm in maximal diameter.  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 Stable.  No role for intervention at this size.  Recheck in 1 year.

## 2020-11-29 ENCOUNTER — Other Ambulatory Visit: Payer: Self-pay | Admitting: Gastroenterology

## 2020-11-29 DIAGNOSIS — R1013 Epigastric pain: Secondary | ICD-10-CM

## 2020-12-08 ENCOUNTER — Other Ambulatory Visit: Payer: Self-pay

## 2020-12-08 ENCOUNTER — Ambulatory Visit
Admission: RE | Admit: 2020-12-08 | Discharge: 2020-12-08 | Disposition: A | Discharge status: Home / Self Care | Payer: Medicare Other | Source: Ambulatory Visit | Attending: Gastroenterology | Admitting: Gastroenterology

## 2020-12-08 DIAGNOSIS — R1013 Epigastric pain: Secondary | ICD-10-CM | POA: Diagnosis present

## 2020-12-08 IMAGING — US US ABDOMEN COMPLETE
1 series · 14 of 25 positions shown · non-contrast
Comparison: None.

CLINICAL DATA: Dyspepsia.

EXAM:
ABDOMEN ULTRASOUND COMPLETE

[Series 1: us abdomen complete · 14 of 76 slices shown]
[im 1/76]
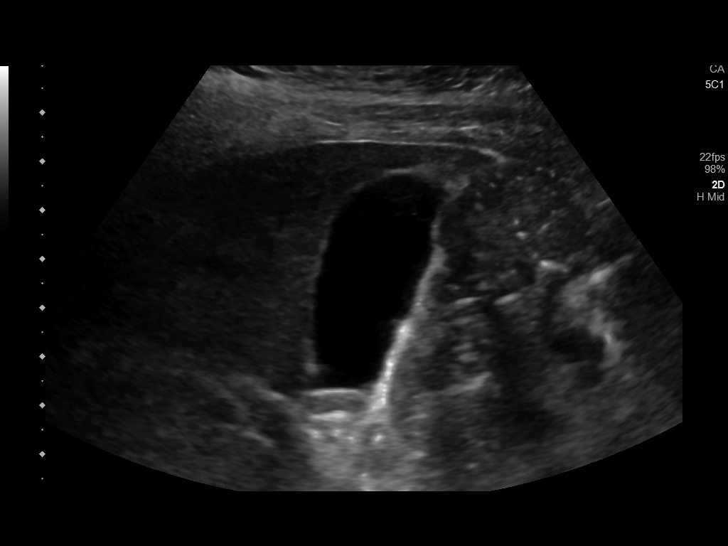
[im 7/76]
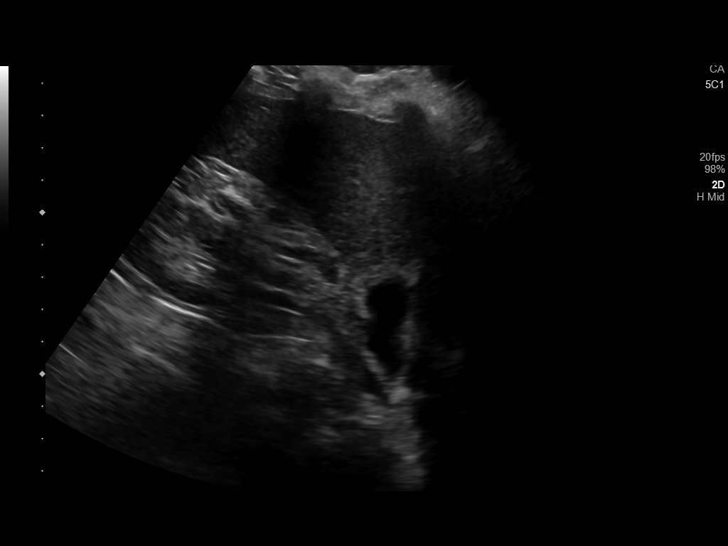
[im 13/76]
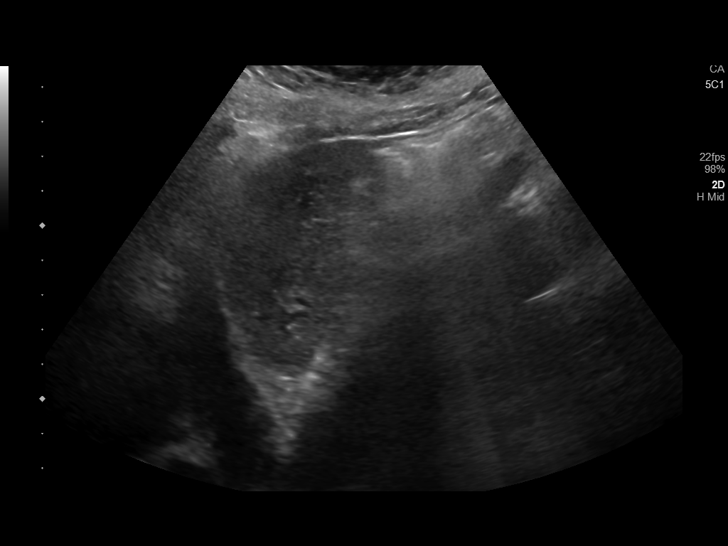
[im 19/76]
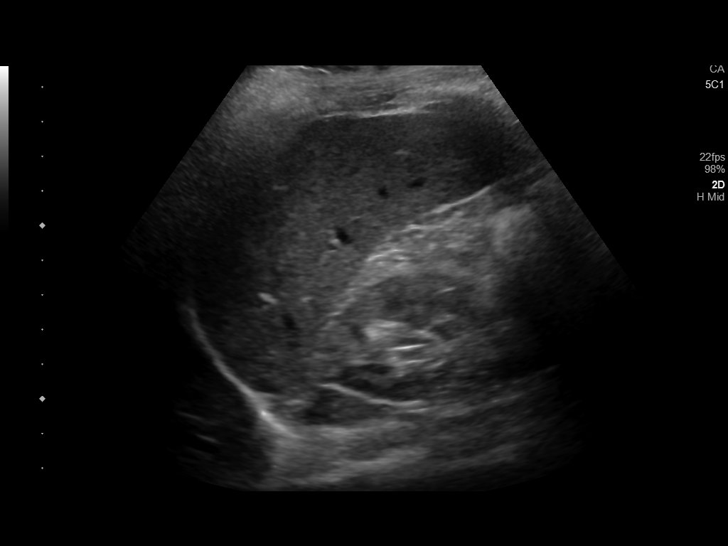
[im 26/76]
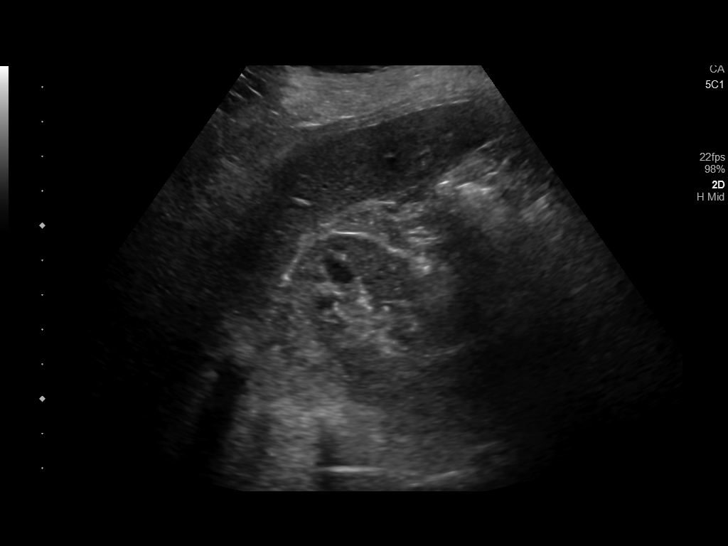
[im 29/76]
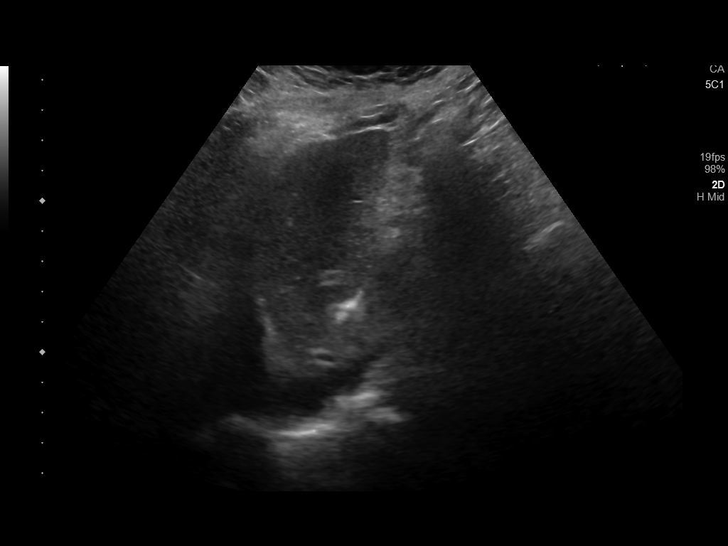
[im 35/76]
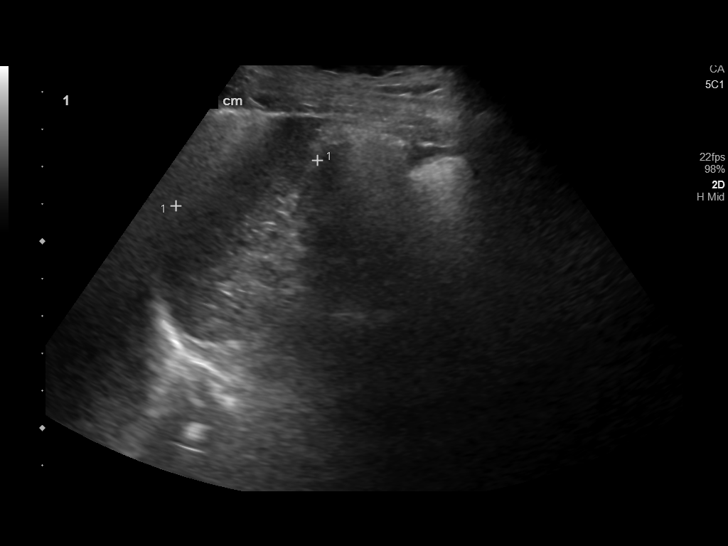
[im 41/76]
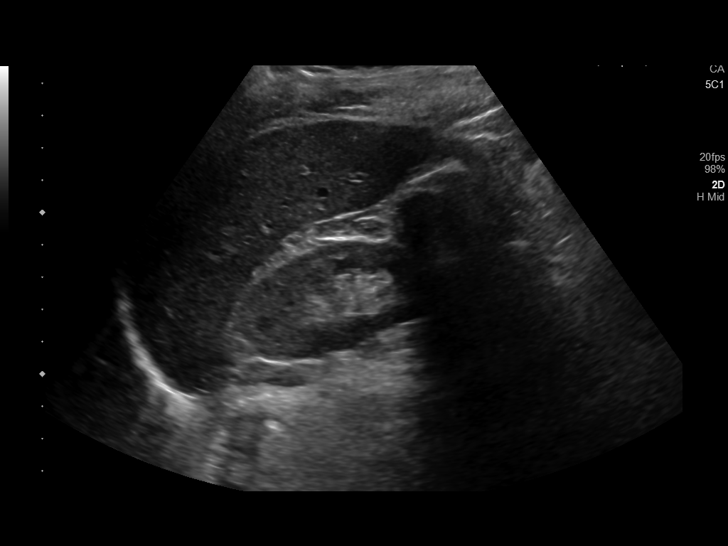
[im 47/76]
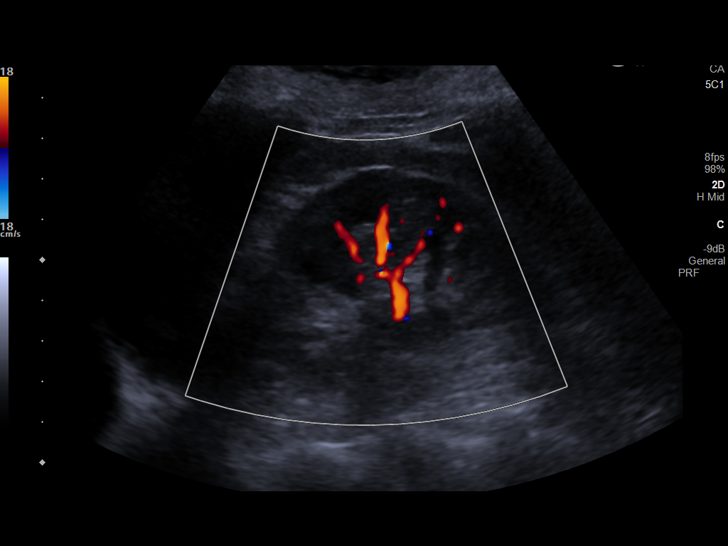
[im 51/76]
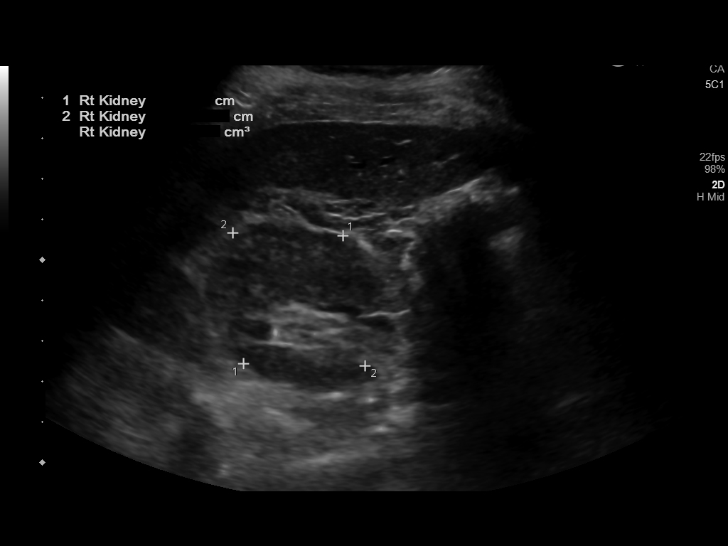
[im 57/76]
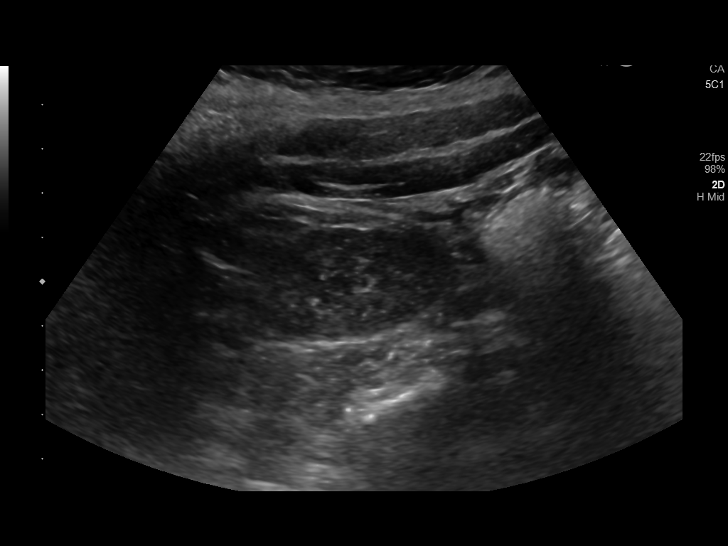
[im 63/76]
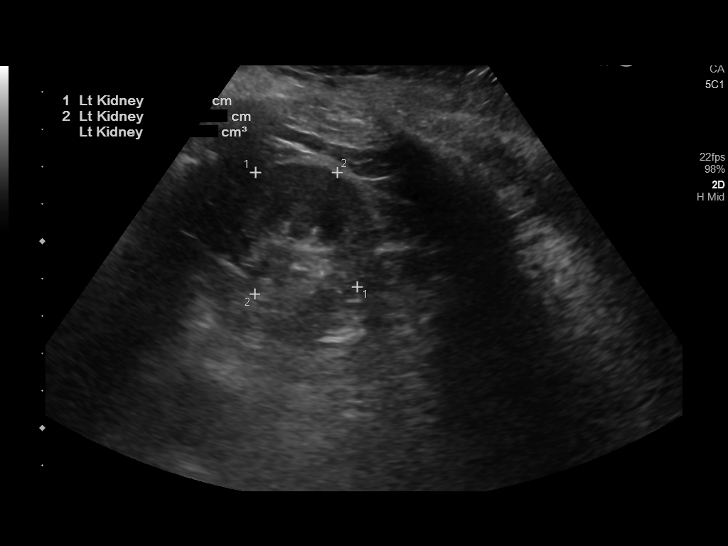
[im 69/76]
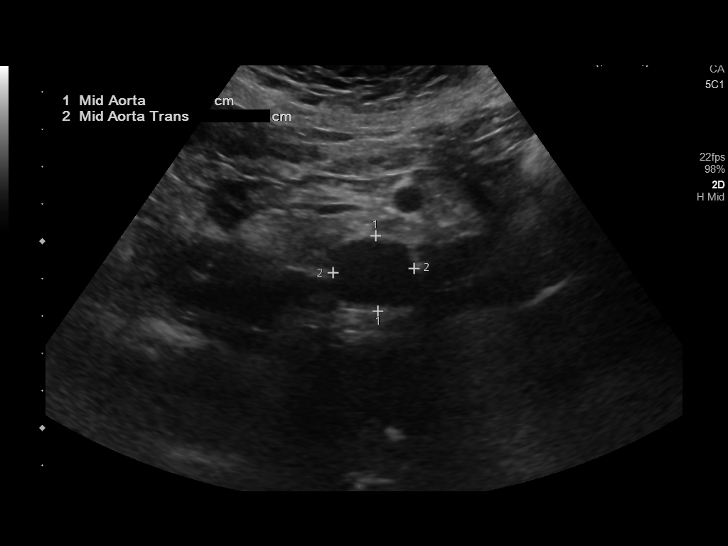
[im 76/76]
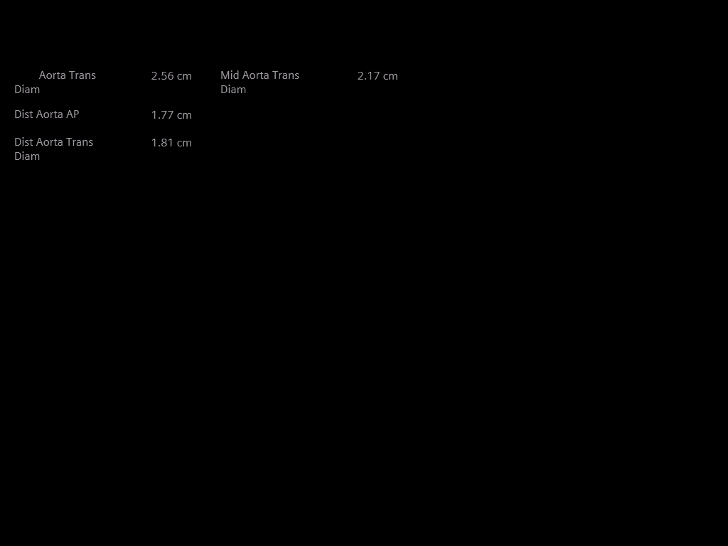

[14 of 25 positions shown; findings below may reference images not displayed]

FINDINGS: Gallbladder: No gallstones or wall thickening visualized (1.1 mm).
No sonographic Murphy sign noted by sonographer.

Common bile duct: Diameter: 5.2 mm

Liver: No focal lesion identified. Within normal limits in
parenchymal echogenicity. Portal vein is patent on color Doppler
imaging with normal direction of blood flow towards the liver.

IVC: No abnormality visualized.

Pancreas: Limited in evaluation secondary to overlying bowel gas.

Spleen: Size and appearance within normal limits.

Right Kidney: Length: 7.8 cm. Echogenicity within normal limits. A
1.0 cm x 1.0 cm x 1.1 cm anechoic structure is seen within the right
kidney. No abnormal flow is noted within this region on color
Doppler evaluation. No hydronephrosis is visualized.

Left Kidney: Length: 9.4 cm. Echogenicity within normal limits. No
mass or hydronephrosis visualized.

Abdominal aorta: No aneurysm visualized (2.8 cm in AP diameter).

Other findings: None.
IMPRESSION: Simple renal cyst within a small right kidney.

## 2020-12-15 ENCOUNTER — Encounter (HOSPITAL_COMMUNITY): Payer: Self-pay | Admitting: Radiology

## 2020-12-15 NOTE — Addendum Note (Signed)
Encounter addended by: Annie Paras on: 12/15/2020 8:39 AM  Actions taken: Letter saved

## 2021-02-06 ENCOUNTER — Ambulatory Visit: Payer: Medicare Other | Admitting: Anesthesiology

## 2021-02-06 ENCOUNTER — Encounter: Payer: Self-pay | Admitting: *Deleted

## 2021-02-06 ENCOUNTER — Encounter
Admission: RE | Disposition: A | Discharge status: Home / Self Care | Payer: Self-pay | Source: Ambulatory Visit | Attending: Gastroenterology

## 2021-02-06 ENCOUNTER — Ambulatory Visit
Admission: RE | Admit: 2021-02-06 | Discharge: 2021-02-06 | Disposition: A | Discharge status: Home / Self Care | Payer: Medicare Other | Source: Ambulatory Visit | Attending: Gastroenterology | Admitting: Gastroenterology

## 2021-02-06 DIAGNOSIS — I10 Essential (primary) hypertension: Secondary | ICD-10-CM | POA: Diagnosis not present

## 2021-02-06 DIAGNOSIS — Z8541 Personal history of malignant neoplasm of cervix uteri: Secondary | ICD-10-CM | POA: Insufficient documentation

## 2021-02-06 DIAGNOSIS — I7121 Aneurysm of the ascending aorta, without rupture: Secondary | ICD-10-CM | POA: Diagnosis not present

## 2021-02-06 DIAGNOSIS — Z79899 Other long term (current) drug therapy: Secondary | ICD-10-CM | POA: Diagnosis not present

## 2021-02-06 DIAGNOSIS — E785 Hyperlipidemia, unspecified: Secondary | ICD-10-CM | POA: Insufficient documentation

## 2021-02-06 DIAGNOSIS — R1013 Epigastric pain: Secondary | ICD-10-CM | POA: Diagnosis present

## 2021-02-06 HISTORY — PX: ESOPHAGOGASTRODUODENOSCOPY (EGD) WITH PROPOFOL: SHX5813

## 2021-02-06 HISTORY — DX: Aneurysm of the ascending aorta, without rupture: I71.21

## 2021-02-06 SURGERY — ESOPHAGOGASTRODUODENOSCOPY (EGD) WITH PROPOFOL
Anesthesia: General

## 2021-02-06 MED ORDER — PROPOFOL 10 MG/ML IV BOLUS
INTRAVENOUS | Status: DC | PRN
  Administered 2021-02-06: 50 mg via INTRAVENOUS

## 2021-02-06 MED ORDER — PROPOFOL 500 MG/50ML IV EMUL
INTRAVENOUS | Status: DC | PRN
  Administered 2021-02-06: 200 ug/kg/min via INTRAVENOUS

## 2021-02-06 MED ORDER — SODIUM CHLORIDE 0.9 % IV SOLN: INTRAVENOUS | Status: DC

## 2021-02-06 NOTE — Anesthesia Procedure Notes (Signed)
Date/Time: 02/06/2021 11:55 AM Performed by: Nelda Marseille, CRNA Oxygen Delivery Method: Nasal cannula

## 2021-02-06 NOTE — Anesthesia Preprocedure Evaluation (Signed)
Anesthesia Evaluation  Patient identified by MRN, date of birth, ID band Patient awake    Reviewed: Allergy & Precautions, H&P , NPO status , Patient's Chart, lab work & pertinent test results, reviewed documented beta blocker date and time   History of Anesthesia Complications Negative for: history of anesthetic complications  Airway Mallampati: III  TM Distance: >3 FB Neck ROM: full    Dental  (+) Chipped, Dental Advidsory Given   Pulmonary neg pulmonary ROS,    Pulmonary exam normal breath sounds clear to auscultation       Cardiovascular Exercise Tolerance: Good hypertension, Pt. on medications and Pt. on home beta blockers (-) angina(-) Past MI and (-) Cardiac Stents negative cardio ROS Normal cardiovascular exam(-) dysrhythmias (-) Valvular Problems/Murmurs Rhythm:regular Rate:Normal     Neuro/Psych neg Seizures  Neuromuscular disease negative psych ROS   GI/Hepatic negative GI ROS, Neg liver ROS,   Endo/Other  negative endocrine ROS  Renal/GU negative Renal ROS  negative genitourinary   Musculoskeletal  (+) Arthritis ,   Abdominal   Peds  Hematology negative hematology ROS (+) Blood dyscrasia, anemia ,   Anesthesia Other Findings Past Medical History: No date: Anemia No date: Arthritis No date: Ascending aortic aneurysm No date: Candidiasis of the genitals, female No date: Cervical cancer (HCC)     Comment:  Hysterectomy. No date: Cystitis No date: Diverticulosis No date: Duodenal adenoma No date: Gastritis No date: Hyperlipidemia No date: Hypertension No date: Incontinence overflow, stress female No date: Sciatica No date: Tubular adenoma   Reproductive/Obstetrics negative OB ROS                             Anesthesia Physical  Anesthesia Plan  ASA: 3  Anesthesia Plan: General   Post-op Pain Management:    Induction: Intravenous  PONV Risk Score and Plan:  Propofol infusion and TIVA  Airway Management Planned: Natural Airway and Nasal Cannula  Additional Equipment:   Intra-op Plan:   Post-operative Plan:   Informed Consent: I have reviewed the patients History and Physical, chart, labs and discussed the procedure including the risks, benefits and alternatives for the proposed anesthesia with the patient or authorized representative who has indicated his/her understanding and acceptance.     Dental Advisory Given  Plan Discussed with: CRNA  Anesthesia Plan Comments:         Anesthesia Quick Evaluation

## 2021-02-06 NOTE — Op Note (Signed)
[facility] Patient Name: Carolyn Woods Procedure Date: 02/06/2021 11:21 AM MRN: [id number] Account #: [id number] Date of Birth: 08-31-1940 Admit Type: Outpatient Age: 81 Room: [facility] ENDO ROOM 1 Gender: Female Note Status: Finalized Instrument Name: Altamese Cabal Endoscope [id number] Procedure:             Upper GI endoscopy Indications:           Dyspepsia Providers:             Andrey Farmer MD, MD Referring MD:          Irven Easterly. Kary Kos, MD (Referring MD) Medicines:             Monitored Anesthesia Care Complications:         No immediate complications. Estimated blood loss:                         Minimal. Procedure:             Pre-Anesthesia Assessment:                        - Prior to the procedure, a History and Physical was                         performed, and patient medications and allergies were                         reviewed. The patient is competent. The risks and                         benefits of the procedure and the sedation options and                         risks were discussed with the patient. All questions                         were answered and informed consent was obtained.                         Patient identification and proposed procedure were                         verified by the physician, the nurse, the                         anesthesiologist, the anesthetist and the technician                         in the endoscopy suite. Mental Status Examination:                         alert and oriented. Airway Examination: normal                         oropharyngeal airway and neck mobility. Respiratory                         Examination: clear to auscultation. CV Examination:  normal. Prophylactic Antibiotics: The patient does not                         require prophylactic antibiotics. Prior                         Anticoagulants: The patient has taken no previous                          anticoagulant or antiplatelet agents except for                         aspirin. ASA Grade Assessment: II - A patient with                         mild systemic disease. After reviewing the risks and                         benefits, the patient was deemed in satisfactory                         condition to undergo the procedure. The anesthesia                         plan was to use monitored anesthesia care (MAC).                         Immediately prior to administration of medications,                         the patient was re-assessed for adequacy to receive                         sedatives. The heart rate, respiratory rate, oxygen                         saturations, blood pressure, adequacy of pulmonary                         ventilation, and response to care were monitored                         throughout the procedure. The physical status of the                         patient was re-assessed after the procedure.                        After obtaining informed consent, the endoscope was                         passed under direct vision. Throughout the procedure,                         the patient's blood pressure, pulse, and oxygen                         saturations were monitored continuously. The Endoscope  was introduced through the mouth, and advanced to the                         second part of duodenum. The upper GI endoscopy was                         accomplished without difficulty. The patient tolerated                         the procedure well. Findings:      The examined esophagus was normal. 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 Estimated blood loss was minimal.      The entire examined stomach was normal.      A tattoo was seen in the second portion of the duodenum. A       post-polypectomy scar was found at the tattoo site.  Biopsies were taken       with a cold forceps for histology. Estimated blood loss was minimal.      The exam of the duodenum was otherwise normal. Impression:            - Normal esophagus. Biopsied.                        - Normal stomach.                        - A tattoo was seen in the duodenum. A                         post-polypectomy scar was found at the tattoo site.                         Biopsied. Recommendation:        - Discharge patient to home.                        - Resume previous diet.                        - Continue present medications.                        - Await pathology results.                        - Return to referring physician as previously                         scheduled. Procedure Code(s):     --- Professional ---                        [phone], Esophagogastroduodenoscopy, flexible,                         transoral; with biopsy, single or multiple Diagnosis Code(s):     --- Professional ---                        R10.13, Epigastric  pain CPT copyright 2019 American Medical Association. All rights reserved. The codes documented in this report are preliminary and upon coder review may  be revised to meet current compliance requirements. Andrey Farmer MD, MD 02/06/2021 12:02:08 PM Number of Addenda: 0 Note Initiated On: 02/06/2021 11:21 AM Scope Withdrawal Time: 0 hours 0 minutes 1 second  Total Procedure Duration: 0 hours 12 minutes 3 seconds  Estimated Blood Loss:  Estimated blood loss was minimal.      [facility]

## 2021-02-06 NOTE — Transfer of Care (Signed)
Immediate Anesthesia Transfer of Care Note  Patient: Carolyn Woods  Procedure(s) Performed: ESOPHAGOGASTRODUODENOSCOPY (EGD) WITH PROPOFOL  Patient Location: PACU  Anesthesia Type:General  Level of Consciousness: awake and sedated  Airway & Oxygen Therapy: Patient Spontanous Breathing and Patient connected to nasal cannula oxygen  Post-op Assessment: Report given to RN and Post -op Vital signs reviewed and stable  Post vital signs: Reviewed and stable  Last Vitals:  Vitals Value Taken Time  BP 126/78 02/06/21 1204  Temp 36.7 C 02/06/21 1203  Pulse 82 02/06/21 1205  Resp 18 02/06/21 1205  SpO2 91 % 02/06/21 1205  Vitals shown include unvalidated device data.  Last Pain:  Vitals:   02/06/21 1203  TempSrc: Temporal  PainSc: Asleep         Complications: No notable events documented.

## 2021-02-07 ENCOUNTER — Encounter: Payer: Self-pay | Admitting: Gastroenterology

## 2021-02-07 LAB — SURGICAL PATHOLOGY

## 2021-02-12 NOTE — Anesthesia Postprocedure Evaluation (Signed)
Anesthesia Post Note  Patient: Carolyn Woods  Procedure(s) Performed: ESOPHAGOGASTRODUODENOSCOPY (EGD) WITH PROPOFOL  Patient location during evaluation: Endoscopy Anesthesia Type: General Level of consciousness: awake and alert Pain management: pain level controlled Vital Signs Assessment: post-procedure vital signs reviewed and stable Respiratory status: spontaneous breathing, nonlabored ventilation, respiratory function stable and patient connected to nasal cannula oxygen Cardiovascular status: blood pressure returned to baseline and stable Postop Assessment: no apparent nausea or vomiting Anesthetic complications: no   No notable events documented.   Last Vitals:  Vitals:   02/06/21 1213 02/06/21 1223  BP: 115/60 (!) 129/59  Pulse: 65 62  Resp: 19 20  Temp:    SpO2: 98% 98%    Last Pain:  Vitals:   02/06/21 1223  TempSrc:   PainSc: 0-No pain                 Martha Clan

## 2021-02-20 NOTE — H&P (Signed)
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 No blood thinners. No family history of GI malignancies. History of hysterectomy.   No current facility-administered medications for this encounter.  Current Outpatient Medications:    amLODipine (NORVASC) 2.5 MG tablet, Take 2.5 mg by mouth., Disp: , Rfl:    atenolol (TENORMIN) 25 MG tablet, Take by mouth., Disp: , Rfl:    gabapentin (NEURONTIN) 100 MG capsule, Take 100 mg by mouth at bedtime., Disp: , Rfl:    pantoprazole (PROTONIX) 40 MG tablet, TAKE 1 TABLET BY MOUTH TWICE DAILY, Disp: , Rfl:    simvastatin (ZOCOR) 20 MG tablet, Take 20 mg by mouth., Disp: , Rfl:    Vitamin D, Ergocalciferol, (DRISDOL) 50000 units CAPS capsule, TK ONE C PO ONCE A WEEK, Disp: , Rfl:    aspirin EC 81 MG tablet, Take 81 mg by mouth daily., Disp: , Rfl:    conjugated estrogens (PREMARIN) vaginal cream, Place vaginally., Disp: , Rfl:    estradiol (ESTRACE) 1 MG tablet, Take 1 mg by mouth 2 (two) times a week., Disp: , Rfl:    lubiprostone (AMITIZA) 8 MCG capsule, Take 8 mcg by mouth 2 (two) times daily with a meal. , Disp: , Rfl:    NONFORMULARY OR COMPOUNDED ITEM, See pharmacy note, Disp: 120 each, Rfl: 2  No medications prior to admission.     Allergies  Allergen Reactions   Linzess [Linaclotide]    Codeine Rash     Past Medical History:  Diagnosis Date   Anemia    Arthritis    Ascending aortic aneurysm    Candidiasis of the genitals, female    Cervical cancer ([place])    Hysterectomy.   Cystitis    Diverticulosis    Duodenal adenoma    Gastritis    Hyperlipidemia    Hypertension    Incontinence overflow, stress female    Sciatica    Tubular adenoma     Review of systems:  Otherwise negative.    Physical Exam  Gen: Alert,  oriented. Appears stated age.  HEENT: PERRLA. Lungs: No respiratory distress CV: RRR Abd: soft, benign, no masses Ext: No edema    Planned procedures: Proceed with EGD. The patient understands the nature of the planned procedure, indications, risks, alternatives and potential complications including but not limited to bleeding, infection, perforation, damage to internal organs and possible oversedation/side effects from anesthesia. The patient agrees and gives consent to proceed.  Please refer to procedure notes for findings, recommendations and patient disposition/instructions.     Lesly Rubenstein, MD [facility]

## 2021-08-28 ENCOUNTER — Other Ambulatory Visit: Payer: Self-pay | Admitting: Family Medicine

## 2021-08-28 DIAGNOSIS — Z1231 Encounter for screening mammogram for malignant neoplasm of breast: Secondary | ICD-10-CM

## 2021-09-20 ENCOUNTER — Ambulatory Visit
Admission: RE | Admit: 2021-09-20 | Discharge: 2021-09-20 | Disposition: A | Discharge status: Home / Self Care | Payer: Medicare Other | Source: Ambulatory Visit | Attending: Family Medicine | Admitting: Family Medicine

## 2021-09-20 DIAGNOSIS — Z1231 Encounter for screening mammogram for malignant neoplasm of breast: Secondary | ICD-10-CM | POA: Insufficient documentation

## 2021-10-19 ENCOUNTER — Other Ambulatory Visit (INDEPENDENT_AMBULATORY_CARE_PROVIDER_SITE_OTHER): Payer: Self-pay | Admitting: Nurse Practitioner

## 2021-10-19 ENCOUNTER — Telehealth (INDEPENDENT_AMBULATORY_CARE_PROVIDER_SITE_OTHER): Payer: Self-pay | Admitting: Vascular Surgery

## 2021-10-19 DIAGNOSIS — I7121 Aneurysm of the ascending aorta, without rupture: Secondary | ICD-10-CM

## 2021-10-19 NOTE — Telephone Encounter (Signed)
LVM for pt TCB. Dr. Lucky Cowboy has ordered her yearly CT and she may call Radiology Scheduling at [phone] and schedule. Patient will also need to call back to [facility] and schedule a CT results appointment with Dr. Lucky Cowboy. Nothing further needed at this time.

## 2021-10-19 NOTE — Telephone Encounter (Signed)
Spoke with pt and provided number again to radiology scheduling. She will call them to schedule the CT appt and then call us back to make the CT results appt with Dr. Lucky Cowboy. Nothing further needed at this time.

## 2021-11-07 ENCOUNTER — Ambulatory Visit
Admission: RE | Admit: 2021-11-07 | Discharge: 2021-11-07 | Disposition: A | Discharge status: Home / Self Care | Payer: Medicare Other | Source: Ambulatory Visit | Attending: Nurse Practitioner | Admitting: Nurse Practitioner

## 2021-11-07 DIAGNOSIS — I7121 Aneurysm of the ascending aorta, without rupture: Secondary | ICD-10-CM | POA: Diagnosis present

## 2021-11-07 MED ORDER — IOHEXOL 350 MG/ML SOLN
100.0000 mL | Freq: Once | INTRAVENOUS | Status: AC | PRN
  Administered 2021-11-07: 100 mL via INTRAVENOUS

## 2021-11-09 ENCOUNTER — Ambulatory Visit (INDEPENDENT_AMBULATORY_CARE_PROVIDER_SITE_OTHER): Payer: Medicare Other | Admitting: Vascular Surgery

## 2021-11-09 ENCOUNTER — Encounter (INDEPENDENT_AMBULATORY_CARE_PROVIDER_SITE_OTHER): Payer: Self-pay | Admitting: Vascular Surgery

## 2021-11-09 VITALS — BP 173/77 | HR 69 | Resp 16 | Wt 166.4 lb

## 2021-11-09 DIAGNOSIS — I7121 Aneurysm of the ascending aorta, without rupture: Secondary | ICD-10-CM | POA: Diagnosis not present

## 2021-11-09 DIAGNOSIS — I728 Aneurysm of other specified arteries: Secondary | ICD-10-CM

## 2021-11-09 DIAGNOSIS — E785 Hyperlipidemia, unspecified: Secondary | ICD-10-CM

## 2021-11-09 DIAGNOSIS — I10 Essential (primary) hypertension: Secondary | ICD-10-CM | POA: Diagnosis not present

## 2021-11-09 NOTE — Progress Notes (Signed)
MRN : [id number]  Carolyn Woods is a 81 y.o. ([birth year]) female who presents with chief complaint of  Chief Complaint  Patient presents with   Follow-up    Ct results  .  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  She is doing well.  Her biggest complaint currently is of cramps and we discussed this some today.  She had no aneurysm related symptoms such as chest pain, back pain, or peripheral embolization.  Her CT scan shows a stable 4.1 cm ascending thoracic aortic aneurysm.  Although the radiologist did not comment on her innominate artery aneurysm, this is also stable and less than 2 cm on her CT scan today.  I have independently reviewed her CT scan.  Current Outpatient Medications  Medication Sig Dispense Refill   amLODipine (NORVASC) 2.5 MG tablet Take 2.5 mg by mouth.     aspirin EC 81 MG tablet Take 81 mg by mouth daily.     atenolol (TENORMIN) 25 MG tablet Take by mouth.     conjugated estrogens (PREMARIN) vaginal cream Place vaginally.     estradiol (ESTRACE) 1 MG tablet Take 1 mg by mouth 2 (two) times a week.     gabapentin (NEURONTIN) 100 MG capsule Take 100 mg by mouth at bedtime.     lubiprostone (AMITIZA) 8 MCG capsule Take 8 mcg by mouth 2 (two) times daily with a meal.      NONFORMULARY OR COMPOUNDED ITEM See pharmacy note 120 each 2   pantoprazole (PROTONIX) 40 MG tablet TAKE 1 TABLET BY MOUTH TWICE DAILY     simvastatin (ZOCOR) 20 MG tablet Take 20 mg by mouth.     Vitamin D, Ergocalciferol, (DRISDOL) 50000 units CAPS capsule TK ONE C PO ONCE A WEEK     No current facility-administered medications for this visit.    Past Medical History:  Diagnosis Date   Anemia    Arthritis    Ascending aortic aneurysm (HCC)    Candidiasis of the genitals, female    Cervical cancer [facility])    Hysterectomy.   Cystitis    Diverticulosis    Duodenal adenoma    Gastritis     Hyperlipidemia    Hypertension    Incontinence overflow, stress female    Sciatica    Tubular adenoma     Past Surgical History:  Procedure Laterality Date   ABDOMINAL HYSTERECTOMY     COLONOSCOPY  02/20/10   ERCP     ESOPHAGOGASTRODUODENOSCOPY (EGD) WITH PROPOFOL N/A 10/25/2014   Procedure: ESOPHAGOGASTRODUODENOSCOPY (EGD) WITH PROPOFOL;  Surgeon: Lollie Sails, MD;  Location: [facility] ENDOSCOPY;  Service: Endoscopy;  Laterality: N/A;   ESOPHAGOGASTRODUODENOSCOPY (EGD) WITH PROPOFOL N/A 12/05/2015   Procedure: ESOPHAGOGASTRODUODENOSCOPY (EGD) WITH PROPOFOL;  Surgeon: Lollie Sails, MD;  Location: [facility] ENDOSCOPY;  Service: Endoscopy;  Laterality: N/A;   ESOPHAGOGASTRODUODENOSCOPY (EGD) WITH PROPOFOL N/A 02/06/2021   Procedure: ESOPHAGOGASTRODUODENOSCOPY (EGD) WITH PROPOFOL;  Surgeon: Lesly Rubenstein, MD;  Location: [facility] ENDOSCOPY;  Service: Endoscopy;  Laterality: N/A;     Social History   Tobacco Use   Smoking status: Never   Smokeless tobacco: Never  Vaping Use   Vaping Use: Never used  Substance Use Topics   Alcohol use: No    Alcohol/week: 1.0 standard drink of alcohol    Types: 1 Glasses of wine per week   Drug use: No      Family  History  Problem Relation Age of Onset   Breast cancer Sister      Allergies  Allergen Reactions   Linzess [Linaclotide]    Codeine Rash    REVIEW OF SYSTEMS (Negative unless checked)   Constitutional: '[]'$ Weight loss  '[]'$ Fever  '[]'$ Chills Cardiac: '[]'$ Chest pain   '[]'$ Chest pressure   '[]'$ Palpitations   '[]'$ Shortness of breath when laying flat   '[]'$ Shortness of breath at rest   '[]'$ Shortness of breath with exertion. Vascular:  '[]'$ Pain in legs with walking   '[]'$ Pain in legs at rest   '[]'$ Pain in legs when laying flat   '[]'$ Claudication   '[]'$ Pain in feet when walking  '[]'$ Pain in feet at rest  '[]'$ Pain in feet when laying flat   '[]'$ History of DVT   '[]'$ Phlebitis   '[]'$ Swelling in legs   '[]'$ Varicose veins   '[]'$ Non-healing ulcers Pulmonary:   '[]'$ Uses home oxygen    '[]'$ Productive cough   '[]'$ Hemoptysis   '[]'$ Wheeze  '[]'$ COPD   '[]'$ Asthma Neurologic:  '[]'$ Dizziness  '[]'$ Blackouts   '[]'$ Seizures   '[]'$ History of stroke   '[]'$ History of TIA  '[]'$ Aphasia   '[]'$ Temporary blindness   '[]'$ Dysphagia   '[]'$ Weakness or numbness in arms   '[]'$ Weakness or numbness in legs Musculoskeletal:  '[x]'$ Arthritis   '[]'$ Joint swelling   '[x]'$ Joint pain   '[]'$ Low back pain Hematologic:  '[]'$ Easy bruising  '[]'$ Easy bleeding   '[]'$ Hypercoagulable state   '[x]'$ Anemic   Gastrointestinal:  '[]'$ Blood in stool   '[]'$ Vomiting blood  '[x]'$ Gastroesophageal reflux/heartburn   '[]'$ Abdominal pain Genitourinary:  '[]'$ Chronic kidney disease   '[]'$ Difficult urination  '[]'$ Frequent urination  '[]'$ Burning with urination   '[]'$ Hematuria Skin:  '[]'$ Rashes   '[]'$ Ulcers   '[]'$ Wounds Psychological:  '[]'$ History of anxiety   '[]'$  History of major depression.   Physical Examination  BP (!) 173/77 (BP Location: Right Arm)   Pulse 69   Resp 16   Wt 166 lb 6.4 oz (75.5 kg)   BMI 30.43 kg/m  Gen:  WD/WN, NAD.  Appears younger than stated age Head: [place]/AT, No temporalis wasting. Ear/Nose/Throat: Hearing grossly intact, nares w/o erythema or drainage Eyes: Conjunctiva clear. Sclera non-icteric Neck: Supple.  Trachea midline Pulmonary:  Good air movement, no use of accessory muscles.  Cardiac: RRR, no JVD Vascular:  Vessel Right Left  Radial Palpable Palpable               Musculoskeletal: M/S 5/5 throughout.  No deformity or atrophy.  Neurologic: Sensation grossly intact in extremities.  Symmetrical.  Speech is fluent.  Psychiatric: Judgment intact, Mood & affect appropriate for pt's clinical situation. Dermatologic: No rashes or ulcers noted.        Labs No results found for this or any previous visit (from the past 2160 hour(s)).  Radiology CT ANGIO CHEST AORTA W/CM & OR WO/CM  Result Date: 11/07/2021 CLINICAL DATA:  Thoracic aortic aneurysm. EXAM: CT ANGIOGRAPHY CHEST WITH CONTRAST TECHNIQUE: Multidetector CT imaging of the chest was performed using the  standard protocol during bolus administration of intravenous contrast. Multiplanar CT image reconstructions and MIPs were obtained to evaluate the vascular anatomy. RADIATION DOSE REDUCTION: This exam was performed according to the departmental dose-optimization program which includes automated exposure control, adjustment of the mA and/or kV according to patient size and/or use of iterative reconstruction technique. CONTRAST:  127m OMNIPAQUE IOHEXOL 350 MG/ML SOLN COMPARISON:  August 14, 2020. FINDINGS: Cardiovascular: Grossly stable 4.1 cm ascending thoracic aortic aneurysm is noted as well as grossly stable 4.1 cm aneurysm involving the proximal descending thoracic aorta. Atherosclerosis of  the thoracic aorta is noted without dissection. Stable cardiomegaly. No pericardial effusion is noted. 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 Great vessels are widely patent without significant stenosis. 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 Mediastinum/Nodes: No enlarged mediastinal, hilar, or axillary lymph nodes. Thyroid gland, trachea, and esophagus demonstrate no significant findings. Lungs/Pleura: No pneumothorax or pleural effusion is noted. Left lower lobe nodule seen on prior exam is not well visualized currently. No significant pulmonary parenchymal abnormality is noted. Upper Abdomen: No acute abnormality. Musculoskeletal: No chest wall abnormality. No acute or significant osseous findings. Review of the MIP images confirms the above findings. IMPRESSION: Grossly stable 4.1 cm ascending thoracic aortic aneurysm as well as grossly stable 4.1 cm aneurysm involving proximal descending thoracic aorta. Recommend annual imaging followup by CTA or MRA. This recommendation follows 2010 ACCF/AHA/AATS/ACR/ASA/SCA/SCAI/SIR/STS/SVM Guidelines for the Diagnosis and Management of Patients with Thoracic Aortic Disease. Circulation. 2010; 121: T977-S142.  Aortic aneurysm NOS (ICD10-I71.9). 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 Aortic Atherosclerosis (ICD10-I70.0). Electronically Signed   By: Marijo Conception M.D.   On: 11/07/2021 10:55    Assessment/Plan Hypertension blood pressure control important in reducing the progression of atherosclerotic disease and aneurysmal degeneration. On appropriate oral medications.     Hyperlipidemia lipid control important in reducing the progression of atherosclerotic disease. Continue statin therapy  Ascending aortic aneurysm (HCC) Her CT scan shows a stable 4.1 cm ascending thoracic aortic aneurysm.  Although the radiologist did not comment on her innominate artery aneurysm, this is also stable and less than 2 cm on her CT scan today.  I have independently reviewed her CT scan. No role for intervention at this level.  Recheck in 1 year.  Subclavian aneurysm (HCC) Her CT scan shows a stable 4.1 cm ascending thoracic aortic aneurysm.  Although the radiologist did not comment on her innominate artery aneurysm, this is also stable and less than 2 cm on her CT scan today.  I have independently reviewed her CT scan.  No role for intervention.  Recheck in 1 year.    Leotis Pain, MD  11/09/2021 11:21 AM    This note was created with Dragon medical transcription system.  Any errors from dictation are purely unintentional

## 2021-11-09 NOTE — Assessment & Plan Note (Signed)
Her CT scan shows a stable 4.1 cm ascending thoracic aortic aneurysm.  Although the radiologist did not comment on her innominate artery aneurysm, this is also stable and less than 2 cm on her CT scan today.  I have independently reviewed her CT scan.  No role for intervention.  Recheck in 1 year.

## 2021-11-09 NOTE — Assessment & Plan Note (Signed)
Her CT scan shows a stable 4.1 cm ascending thoracic aortic aneurysm.  Although the radiologist did not comment on her innominate artery aneurysm, this is also stable and less than 2 cm on her CT scan today.  I have independently reviewed her CT scan. No role for intervention at this level.  Recheck in 1 year.

## 2021-11-13 ENCOUNTER — Ambulatory Visit (INDEPENDENT_AMBULATORY_CARE_PROVIDER_SITE_OTHER): Payer: Medicare Other | Admitting: Vascular Surgery

## 2021-11-19 ENCOUNTER — Encounter (INDEPENDENT_AMBULATORY_CARE_PROVIDER_SITE_OTHER): Payer: Self-pay

## 2022-09-16 ENCOUNTER — Other Ambulatory Visit: Payer: Self-pay | Admitting: Family Medicine

## 2022-09-16 DIAGNOSIS — Z1231 Encounter for screening mammogram for malignant neoplasm of breast: Secondary | ICD-10-CM

## 2022-11-06 ENCOUNTER — Ambulatory Visit
Admission: RE | Admit: 2022-11-06 | Discharge: 2022-11-06 | Disposition: A | Discharge status: Home / Self Care | Payer: Medicare Other | Source: Ambulatory Visit | Attending: Vascular Surgery | Admitting: Vascular Surgery

## 2022-11-06 DIAGNOSIS — I728 Aneurysm of other specified arteries: Secondary | ICD-10-CM | POA: Diagnosis present

## 2022-11-06 DIAGNOSIS — I7121 Aneurysm of the ascending aorta, without rupture: Secondary | ICD-10-CM | POA: Diagnosis present

## 2022-11-06 MED ORDER — IOHEXOL 350 MG/ML SOLN
75.0000 mL | Freq: Once | INTRAVENOUS | Status: AC | PRN
  Administered 2022-11-06: 75 mL via INTRAVENOUS

## 2022-11-08 ENCOUNTER — Ambulatory Visit
Admission: RE | Admit: 2022-11-08 | Discharge: 2022-11-08 | Disposition: A | Discharge status: Home / Self Care | Payer: Medicare Other | Source: Ambulatory Visit | Attending: Family Medicine | Admitting: Family Medicine

## 2022-11-08 DIAGNOSIS — Z1231 Encounter for screening mammogram for malignant neoplasm of breast: Secondary | ICD-10-CM | POA: Diagnosis present

## 2022-11-12 ENCOUNTER — Ambulatory Visit (INDEPENDENT_AMBULATORY_CARE_PROVIDER_SITE_OTHER): Payer: Medicare Other | Admitting: Vascular Surgery

## 2022-11-12 ENCOUNTER — Encounter (INDEPENDENT_AMBULATORY_CARE_PROVIDER_SITE_OTHER): Payer: Self-pay | Admitting: Vascular Surgery

## 2022-11-12 VITALS — BP 153/83 | HR 68 | Resp 15 | Wt 161.8 lb

## 2022-11-12 DIAGNOSIS — I7121 Aneurysm of the ascending aorta, without rupture: Secondary | ICD-10-CM

## 2022-11-12 DIAGNOSIS — I10 Essential (primary) hypertension: Secondary | ICD-10-CM

## 2022-11-12 DIAGNOSIS — E785 Hyperlipidemia, unspecified: Secondary | ICD-10-CM

## 2022-11-12 DIAGNOSIS — I728 Aneurysm of other specified arteries: Secondary | ICD-10-CM | POA: Diagnosis not present

## 2022-11-12 NOTE — Assessment & Plan Note (Signed)
I have independently reviewed her CT scan of the chest.  Sadly, even though it was done a week ago it has yet be read by the radiologist.  However, by my review, the ascending aortic aneurysm is stable at approximately 4.2 cm.  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  No role for intervention.  Continue to follow annually with CT scan.

## 2022-11-12 NOTE — Progress Notes (Signed)
MRN : [id number]  Carolyn Woods is a 82 y.o. (Jan 01, 1941) female who presents with chief complaint of  Chief Complaint  Patient presents with   Follow-up    Ct results   .  History of Present Illness: Patient returns today in follow up of multiple vascular issues.  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  The patient is doing well.  No symptoms since her last visit or other issues.  No chest pain.  No focal neurologic symptoms. I have independently reviewed her CT scan of the chest.  Sadly, even though it was done a week ago it has yet be read by the radiologist.  However, by my review, the ascending aortic aneurysm is stable at approximately 4.2 cm.  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  Current Outpatient Medications  Medication Sig Dispense Refill   amLODipine (NORVASC) 2.5 MG tablet Take 2.5 mg by mouth.     aspirin EC 81 MG tablet Take 81 mg by mouth daily.     atenolol (TENORMIN) 25 MG tablet Take by mouth.     conjugated estrogens (PREMARIN) vaginal cream Place vaginally.     estradiol (ESTRACE) 1 MG tablet Take 1 mg by mouth 2 (two) times a week.     gabapentin (NEURONTIN) 100 MG capsule Take 100 mg by mouth at bedtime.     lubiprostone (AMITIZA) 8 MCG capsule Take 8 mcg by mouth 2 (two) times daily with a meal.      NONFORMULARY OR COMPOUNDED ITEM See pharmacy note 120 each 2   pantoprazole (PROTONIX) 40 MG tablet TAKE 1 TABLET BY MOUTH TWICE DAILY     simvastatin (ZOCOR) 20 MG tablet Take 20 mg by mouth.     Vitamin D, Ergocalciferol, (DRISDOL) 50000 units CAPS capsule TK ONE C PO ONCE A WEEK     No current facility-administered medications for this visit.    Past Medical History:  Diagnosis Date   Anemia    Arthritis    Ascending aortic aneurysm (HCC)    Candidiasis of the genitals, female    Cervical cancer [facility])    Hysterectomy.   Cystitis    Diverticulosis    Duodenal adenoma     Gastritis    Hyperlipidemia    Hypertension    Incontinence overflow, stress female    Sciatica    Tubular adenoma     Past Surgical History:  Procedure Laterality Date   ABDOMINAL HYSTERECTOMY     COLONOSCOPY  02/20/10   ERCP     ESOPHAGOGASTRODUODENOSCOPY (EGD) WITH PROPOFOL N/A 10/25/2014   Procedure: ESOPHAGOGASTRODUODENOSCOPY (EGD) WITH PROPOFOL;  Surgeon: Christena Deem, MD;  Location: [facility] ENDOSCOPY;  Service: Endoscopy;  Laterality: N/A;   ESOPHAGOGASTRODUODENOSCOPY (EGD) WITH PROPOFOL N/A 12/05/2015   Procedure: ESOPHAGOGASTRODUODENOSCOPY (EGD) WITH PROPOFOL;  Surgeon: Christena Deem, MD;  Location[facility] ENDOSCOPY;  Service: Endoscopy;  Laterality: N/A;   ESOPHAGOGASTRODUODENOSCOPY (EGD) WITH PROPOFOL N/A 02/06/2021   Procedure: ESOPHAGOGASTRODUODENOSCOPY (EGD) WITH PROPOFOL;  Surgeon: Regis Bill, MD;  Location: [facility] ENDOSCOPY;  Service: Endoscopy;  Laterality: N/A;     Social History   Tobacco Use   Smoking status: Never   Smokeless tobacco: Never  Vaping Use   Vaping status: Never Used  Substance Use Topics   Alcohol use: No    Alcohol/week: 1.0 standard drink of alcohol    Types: 1 Glasses of wine per week  Drug use: No       Family History  Problem Relation Age of Onset   Breast cancer Sister      Allergies  Allergen Reactions   Linzess [Linaclotide]    Codeine Rash     REVIEW OF SYSTEMS (Negative unless checked)   Constitutional: [] Weight loss  [] Fever  [] Chills Cardiac: [] Chest pain   [] Chest pressure   [] Palpitations   [] Shortness of breath when laying flat   [] Shortness of breath at rest   [] Shortness of breath with exertion. Vascular:  [] Pain in legs with walking   [] Pain in legs at rest   [] Pain in legs when laying flat   [] Claudication   [] Pain in feet when walking  [] Pain in feet at rest  [] Pain in feet when laying flat   [] History of DVT   [] Phlebitis   [] Swelling in legs   [] Varicose veins   [] Non-healing ulcers Pulmonary:    [] Uses home oxygen   [] Productive cough   [] Hemoptysis   [] Wheeze  [] COPD   [] Asthma Neurologic:  [] Dizziness  [] Blackouts   [] Seizures   [] History of stroke   [] History of TIA  [] Aphasia   [] Temporary blindness   [] Dysphagia   [] Weakness or numbness in arms   [] Weakness or numbness in legs Musculoskeletal:  [x] Arthritis   [] Joint swelling   [x] Joint pain   [] Low back pain Hematologic:  [] Easy bruising  [] Easy bleeding   [] Hypercoagulable state   [x] Anemic   Gastrointestinal:  [] Blood in stool   [] Vomiting blood  [x] Gastroesophageal reflux/heartburn   [] Abdominal pain Genitourinary:  [] Chronic kidney disease   [] Difficult urination  [] Frequent urination  [] Burning with urination   [] Hematuria Skin:  [] Rashes   [] Ulcers   [] Wounds Psychological:  [] History of anxiety   []  History of major depression.  Physical Examination  BP (!) 153/83 (BP Location: Left Arm)   Pulse 68   Resp 15   Wt 161 lb 12.8 oz (73.4 kg)   BMI 29.59 kg/m  Gen:  WD/WN, NAD. Appears younger than stated age. Head: [place]/AT, No temporalis wasting. Ear/Nose/Throat: Hearing grossly intact, nares w/o erythema or drainage Eyes: Conjunctiva clear. Sclera non-icteric Neck: Supple.  Trachea midline Pulmonary:  Good air movement, no use of accessory muscles.  Cardiac: RRR, no JVD Vascular:  Vessel Right Left  Radial Palpable Palpable           Musculoskeletal: M/S 5/5 throughout.  No deformity or atrophy.  No edema. Neurologic: Sensation grossly intact in extremities.  Symmetrical.  Speech is fluent.  Psychiatric: Judgment intact, Mood & affect appropriate for pt's clinical situation. Dermatologic: No rashes or ulcers noted.  No cellulitis or open wounds.      Labs No results found for this or any previous visit (from the past 2160 hour(s)).  Radiology MM 3D SCREENING MAMMOGRAM BILATERAL BREAST  Result Date: 11/11/2022 CLINICAL DATA:  Screening. EXAM: DIGITAL SCREENING BILATERAL MAMMOGRAM WITH TOMOSYNTHESIS AND  CAD TECHNIQUE: Bilateral screening digital craniocaudal and mediolateral oblique mammograms were obtained. Bilateral screening digital breast tomosynthesis was performed. The images were evaluated with computer-aided detection. COMPARISON:  Previous exam(s). ACR Breast Density Category b: There are scattered areas of fibroglandular density. FINDINGS: There are no findings suspicious for malignancy. IMPRESSION: No mammographic evidence of malignancy. A result letter of this screening mammogram will be mailed directly to the patient. RECOMMENDATION: Screening mammogram in one year. (Code:SM-B-01Y) BI-RADS CATEGORY  1: Negative. Electronically Signed   By: Baird Lyons M.D.   On: 11/11/2022 17:28  Assessment/Plan  Ascending aortic aneurysm (HCC) I have independently reviewed her CT scan of the chest.  Sadly, even though it was done a week ago it has yet be read by the radiologist.  However, by my review, the ascending aortic aneurysm is stable at approximately 4.2 cm.  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  No role for intervention.  Continue to follow annually with CT scan.  Subclavian aneurysm (HCC) I have independently reviewed her CT scan of the chest.  Sadly, even though it was done a week ago it has yet be read by the radiologist.  However, by my review, the ascending aortic aneurysm is stable at approximately 4.2 cm.  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  No role for intervention.  Continue to follow annually with CT scan.  Hypertension blood pressure control important in reducing the progression of atherosclerotic disease and aneurysmal degeneration. On appropriate oral medications.     Hyperlipidemia lipid control important in reducing the progression of atherosclerotic disease. Continue statin therapy  Festus Barren, MD  11/12/2022 10:45 AM    This note was created with Dragon medical transcription system.   Any errors from dictation are purely unintentional

## 2023-04-16 ENCOUNTER — Ambulatory Visit (INDEPENDENT_AMBULATORY_CARE_PROVIDER_SITE_OTHER): Admitting: Urology

## 2023-04-16 VITALS — BP 155/80 | HR 75

## 2023-04-16 DIAGNOSIS — K5909 Other constipation: Secondary | ICD-10-CM | POA: Diagnosis not present

## 2023-04-16 DIAGNOSIS — N390 Urinary tract infection, site not specified: Secondary | ICD-10-CM

## 2023-04-16 DIAGNOSIS — N3946 Mixed incontinence: Secondary | ICD-10-CM | POA: Diagnosis not present

## 2023-04-16 LAB — URINALYSIS, COMPLETE
Bilirubin, UA: NEGATIVE
Glucose, UA: NEGATIVE
Ketones, UA: NEGATIVE
Nitrite, UA: POSITIVE — AB
Protein,UA: NEGATIVE
RBC, UA: NEGATIVE
Specific Gravity, UA: 1.015 (ref 1.005–1.030)
Urobilinogen, Ur: 0.2 mg/dL (ref 0.2–1.0)
pH, UA: 7 (ref 5.0–7.5)

## 2023-04-16 LAB — MICROSCOPIC EXAMINATION
Epithelial Cells (non renal): >10 /HPF — AB (ref 0–10)
WBC, UA: >30 /HPF — AB (ref 0–5)

## 2023-04-16 LAB — BLADDER SCAN AMB NON-IMAGING: Scan Result: 110

## 2023-04-16 NOTE — Patient Instructions (Signed)
 For UTI prevention take cranberry tablets, probiotic, D-Mannose daily.  Chronic Bacterial colonization  Cystoscopy Cystoscopy is a procedure that is used to help diagnose and sometimes treat conditions that affect the lower urinary tract. The lower urinary tract includes the bladder and the urethra. The urethra is the tube that drains urine from the bladder. Cystoscopy is done using a thin, tube-shaped instrument with a light and camera at the end (cystoscope). The cystoscope may be hard or flexible, depending on the goal of the procedure. The cystoscope is inserted through the urethra, into the bladder. Cystoscopy may be recommended if you have: Urinary tract infections that keep coming back. Blood in the urine (hematuria). An inability to control when you urinate (urinary incontinence) or an overactive bladder. Unusual cells found in a urine sample. A blockage in the urethra, such as a urinary stone. Painful urination. An abnormality in the bladder found during an intravenous pyelogram (IVP) or CT scan. What are the risks? Generally, this is a safe procedure. However, problems may occur, including: Infection. Bleeding.  What happens during the procedure?  You will be given one or more of the following: A medicine to numb the area (local anesthetic). The area around the opening of your urethra will be cleaned. The cystoscope will be passed through your urethra into your bladder. Germ-free (sterile) fluid will flow through the cystoscope to fill your bladder. The fluid will stretch your bladder so that your health care provider can clearly examine your bladder walls. Your doctor will look at the urethra and bladder. The cystoscope will be removed The procedure may vary among health care providers  What can I expect after the procedure? After the procedure, it is common to have: Some soreness or pain in your urethra. Urinary symptoms. These include: Mild pain or burning when you  urinate. Pain should stop within a few minutes after you urinate. This may last for up to a few days after the procedure. A small amount of blood in your urine for several days. Feeling like you need to urinate but producing only a small amount of urine. Follow these instructions at home: General instructions Return to your normal activities as told by your health care provider.  Drink plenty of fluids after the procedure. Keep all follow-up visits as told by your health care provider. This is important. Contact a health care provider if you: Have pain that gets worse or does not get better with medicine, especially pain when you urinate lasting longer than 72 hours after the procedure. Have trouble urinating. Get help right away if you: Have blood clots in your urine. Have a fever or chills. Are unable to urinate. Summary Cystoscopy is a procedure that is used to help diagnose and sometimes treat conditions that affect the lower urinary tract. Cystoscopy is done using a thin, tube-shaped instrument with a light and camera at the end. After the procedure, it is common to have some soreness or pain in your urethra. It is normal to have blood in your urine after the procedure.  If you were prescribed an antibiotic medicine, take it as told by your health care provider.  This information is not intended to replace advice given to you by your health care provider. Make sure you discuss any questions you have with your health care provider. Document Revised: 12/30/2017 Document Reviewed: 12/30/2017 Elsevier Patient Education  2020 [employer].

## 2023-04-16 NOTE — Progress Notes (Signed)
 Marcelle Overlie Plume,acting as a scribe for Vanna Scotland, MD.,have documented all relevant documentation on the behalf of Vanna Scotland, MD,as directed by  Vanna Scotland, MD while in the presence of Vanna Scotland, MD.  04/16/23 1:10 PM   Carolyn Woods [birth year] [id number]  Referring provider: Romana Juniper, MD [address] [place],  [place] [address]  Chief Complaint  Patient presents with   Establish Care    HPI: 83 year old female presenting with recurrent UTIs since October 2024.   She has a history of chronic cystitis and has been experiencing monthly infections. Previous urinalysis showed growth of E. coli and Klebsiella, both pan-sensitive. She reports using vaginal estrogen cream three nights per week and was previously on methenamine twice per week. Medical history was reviewed in Care Everywhere.  She experiences symptoms such as foul-smelling urine but denies burning, fever, or other typical UTI symptoms.   She has a history of chronic constipation and mixed incontinence, managed with Metamucil, Vifel, raisins, and prune juice. She reports variable stool consistency, sometimes experiencing small, hard stools.   Her urinalysis today has 30 WBC per high powered field, nitrate positive, many bacteria.   She has a history of cancer and is post-hysterectomy. She was previously under the care of Dr. Achilles Dunk and Dr. Olena Heckle and last seen in 2015.   Results for orders placed or performed in visit on 04/16/23  Microscopic Examination   Urine  Result Value Ref Range   WBC, UA >30 (A) 0 - 5 /hpf   RBC, Urine 0-2 0 - 2 /hpf   Epithelial Cells (non renal) >10 (A) 0 - 10 /hpf   Mucus, UA Present (A) Not Estab.   Bacteria, UA Many (A) None seen/Few  Urinalysis, Complete  Result Value Ref Range   Specific Gravity, UA 1.015 1.005 - 1.030   pH, UA 7.0 5.0 - 7.5   Color, UA Yellow Yellow   Appearance Ur Hazy (A) Clear   Leukocytes,UA 2+ (A) Negative    Protein,UA Negative Negative/Trace   Glucose, UA Negative Negative   Ketones, UA Negative Negative   RBC, UA Negative Negative   Bilirubin, UA Negative Negative   Urobilinogen, Ur 0.2 0.2 - 1.0 mg/dL   Nitrite, UA Positive (A) Negative   Microscopic Examination See below:   BLADDER SCAN AMB NON-IMAGING  Result Value Ref Range   Scan Result 110 ml      PMH: Past Medical History:  Diagnosis Date   Anemia    Arthritis    Ascending aortic aneurysm (HCC)    Candidiasis of the genitals, female    Cervical cancer (HCC)    Hysterectomy.   Cystitis    Diverticulosis    Duodenal adenoma    Gastritis    Hyperlipidemia    Hypertension    Incontinence overflow, stress female    Sciatica    Tubular adenoma     Surgical History: Past Surgical History:  Procedure Laterality Date   ABDOMINAL HYSTERECTOMY     COLONOSCOPY  02/20/10   ERCP     ESOPHAGOGASTRODUODENOSCOPY (EGD) WITH PROPOFOL N/A 10/25/2014   Procedure: ESOPHAGOGASTRODUODENOSCOPY (EGD) WITH PROPOFOL;  Surgeon: Christena Deem, MD;  Location: [facility] ENDOSCOPY;  Service: Endoscopy;  Laterality: N/A;   ESOPHAGOGASTRODUODENOSCOPY (EGD) WITH PROPOFOL N/A 12/05/2015   Procedure: ESOPHAGOGASTRODUODENOSCOPY (EGD) WITH PROPOFOL;  Surgeon: Christena Deem, MD;  Location: [facility] ENDOSCOPY;  Service: Endoscopy;  Laterality: N/A;   ESOPHAGOGASTRODUODENOSCOPY (EGD) WITH PROPOFOL N/A 02/06/2021   Procedure: ESOPHAGOGASTRODUODENOSCOPY (EGD)  WITH PROPOFOL;  Surgeon: Regis Bill, MD;  Location: [facility] ENDOSCOPY;  Service: Endoscopy;  Laterality: N/A;    Home Medications:  Allergies as of 04/16/2023       Reactions   Linzess [linaclotide]    Lubiprostone Nausea Only   Codeine Rash        Medication List        Accurate as of April 16, 2023  1:10 PM. If you have any questions, ask your nurse or doctor.          amLODipine 2.5 MG tablet Commonly known as: NORVASC Take 2.5 mg by mouth.   aspirin EC 81 MG tablet Take 81  mg by mouth daily.   atenolol 25 MG tablet Commonly known as: TENORMIN Take by mouth.   conjugated estrogens 0.625 MG/GM vaginal cream Commonly known as: PREMARIN Place vaginally.   donepezil 5 MG tablet Commonly known as: ARICEPT Take 5 mg by mouth at bedtime.   estradiol 1 MG tablet Commonly known as: ESTRACE Take 1 mg by mouth 2 (two) times a week.   gabapentin 100 MG capsule Commonly known as: NEURONTIN Take 100 mg by mouth at bedtime.   lubiprostone 8 MCG capsule Commonly known as: AMITIZA Take 8 mcg by mouth 2 (two) times daily with a meal.   NONFORMULARY OR COMPOUNDED ITEM See pharmacy note   pantoprazole 40 MG tablet Commonly known as: PROTONIX TAKE 1 TABLET BY MOUTH TWICE DAILY   polyethylene glycol powder 17 GM/SCOOP powder Commonly known as: GLYCOLAX/MIRALAX Take by mouth.   simvastatin 20 MG tablet Commonly known as: ZOCOR Take 20 mg by mouth.   Vitamin D (Ergocalciferol) 1.25 MG (50000 UNIT) Caps capsule Commonly known as: DRISDOL TK ONE C PO ONCE A WEEK        Allergies:  Allergies  Allergen Reactions   Linzess [Linaclotide]    Lubiprostone Nausea Only   Codeine Rash    Family History: Family History  Problem Relation Age of Onset   Breast cancer Sister     Social History:  reports that she has never smoked. She has never used smokeless tobacco. She reports that she does not drink alcohol and does not use drugs.   Physical Exam: BP (!) 155/80   Pulse 75   Constitutional:  Alert and oriented, No acute distress. HEENT: [place] AT, moist mucus membranes.  Trachea midline, no masses. Neurologic: Grossly intact, no focal deficits, moving all 4 extremities. Psychiatric: Normal mood and affect.   Assessment & Plan:    1. Recurrent UTIs/Chronic Bacterial Colonization - She is likely experiencing chronic bacterial colonization rather than recurrent UTIs, as her primary symptom is malodorous urine without other signs of infection such as  burning, urgency, or fever. - Plan to perform a renal ultrasound to assess for any underlying issues such as kidney stones. - Schedule a cystoscopy to evaluate the bladder for any abnormalities or potential sources of bacterial colonization. - Recommend starting cranberry tablets and D-mannose supplements to help manage symptoms. - Educate her on the concept of bacterial colonization and the importance of avoiding unnecessary antibiotics to prevent resistance and potential superinfections.  Recurrent UTI Prevention Strategies  Stay well hydrated. Get a moderate amount of exercise. Eat a diet rich in fruit and vegetables. Start a bowel regimen to manage your constipation. Your goal is to have consistent, formed bowel movements that are easy for you to pass. You may use either of the over-the-counter supplements Benefiber or Miralax to help with this. I  recommend that you try Benefiber first and move on to Miralax if this is not helping you enough. You may adjust the recommended dose of Miralax (one capful daily) to achieve this goal. Start taking an over-the-counter cranberry supplement for urinary tract health. Take this once or twice daily on an empty stomach, e.g. right before bed. Start taking an over-the-counter d-mannose supplement. Take this daily per packaging instructions. Start taking an over-the-counter probiotic containing the bacterial species called Lactobacillus. Take this daily. Start vaginal estrogen cream. Apply a pea-sized amount around the opening of the urethra every day for 2 weeks, then three times weekly forever.  2. Chronic Constipation - She reports using Metamucil and prune juice, but still experiences occasional hard stools. - Recommend adding Miralax on days when stools are hard to achieve a well-formed, soft bowel movement.  3. Mixed Incontinence - No specific changes to the current management plan were discussed during this visit.  Return in about 6 weeks (around  05/28/2023) for review of renal ultrasound results and cystoscopy.  I have reviewed the above documentation for accuracy and completeness, and I agree with the above.   Vanna Scotland, MD    [facility] [address] [place], [place] [address] [phone]

## 2023-04-20 LAB — CULTURE, URINE COMPREHENSIVE

## 2023-04-24 ENCOUNTER — Ambulatory Visit
Admission: RE | Admit: 2023-04-24 | Discharge: 2023-04-24 | Disposition: A | Discharge status: Home / Self Care | Source: Ambulatory Visit | Attending: Urology | Admitting: Urology

## 2023-04-24 DIAGNOSIS — N390 Urinary tract infection, site not specified: Secondary | ICD-10-CM | POA: Insufficient documentation

## 2023-04-24 DIAGNOSIS — N3946 Mixed incontinence: Secondary | ICD-10-CM | POA: Diagnosis present

## 2023-04-24 DIAGNOSIS — K5909 Other constipation: Secondary | ICD-10-CM | POA: Diagnosis present

## 2023-04-28 ENCOUNTER — Encounter: Payer: Self-pay | Admitting: Urology

## 2023-05-14 ENCOUNTER — Ambulatory Visit: Admitting: Urology

## 2023-06-10 ENCOUNTER — Encounter (INDEPENDENT_AMBULATORY_CARE_PROVIDER_SITE_OTHER): Payer: Self-pay

## 2023-06-11 ENCOUNTER — Encounter: Payer: Self-pay | Admitting: Urology

## 2023-06-11 ENCOUNTER — Ambulatory Visit (INDEPENDENT_AMBULATORY_CARE_PROVIDER_SITE_OTHER): Admitting: Urology

## 2023-06-11 VITALS — BP 173/70 | HR 68 | Ht 62.0 in | Wt 164.0 lb

## 2023-06-11 DIAGNOSIS — N3946 Mixed incontinence: Secondary | ICD-10-CM

## 2023-06-11 DIAGNOSIS — N390 Urinary tract infection, site not specified: Secondary | ICD-10-CM

## 2023-06-11 DIAGNOSIS — N200 Calculus of kidney: Secondary | ICD-10-CM

## 2023-06-11 DIAGNOSIS — R8271 Bacteriuria: Secondary | ICD-10-CM

## 2023-06-11 LAB — URINALYSIS, COMPLETE
Bilirubin, UA: NEGATIVE
Glucose, UA: NEGATIVE
Ketones, UA: NEGATIVE
Nitrite, UA: POSITIVE — AB
Protein,UA: NEGATIVE
RBC, UA: NEGATIVE
Specific Gravity, UA: 1.01 (ref 1.005–1.030)
Urobilinogen, Ur: 0.2 mg/dL (ref 0.2–1.0)
pH, UA: 6 (ref 5.0–7.5)

## 2023-06-11 LAB — MICROSCOPIC EXAMINATION

## 2023-06-11 MED ORDER — SULFAMETHOXAZOLE-TRIMETHOPRIM 800-160 MG PO TABS
1.0000 | ORAL_TABLET | Freq: Once | ORAL | Status: AC
Start: 2023-06-11 — End: 2023-06-11
  Administered 2023-06-11: 1 via ORAL

## 2023-06-11 NOTE — Patient Instructions (Signed)
 Cystoscopy was normal today.  If you are only having odor from your urine this does not mean you have an infection.    Miralax and metamucil can help with constipation.  For UTI prevention take cranberry tablets, probiotic, D-Mannose daily.

## 2023-06-13 NOTE — Progress Notes (Signed)
   06/12/23  CC:  Chief Complaint  Patient presents with   Recurrent UTI    HPI: 83 year old female with chronic bacteriuria which is asymptomatic presents for cystoscopy to rule out underlying pathology.  Today, she has no urinary complaints.  She denies any dysuria.  Her urinalysis as per previous appears to be suspicious but she is not having any burning or lower urinary tract symptoms other than her baseline.  She was given a single dose of prophylactic antibiotics today in the setting of bacteriuria.  Uppertract imaging in the form of a renal ultrasound shows a nonobstructing renal calculus.  She denies any flank pain.  Blood pressure (!) 173/70, pulse 68, height 5\' 2"  (1.575 m), weight 164 lb (74.4 kg). NED. A&Ox3.   No respiratory distress   Abd soft, NT, ND Normal external genitalia with patent urethral meatus  Cystoscopy Procedure Note  Patient identification was confirmed, informed consent was obtained, and patient was prepped using Betadine solution.  Lidocaine  jelly was administered per urethral meatus.    Procedure: - Flexible cystoscope introduced, without any difficulty.   - Thorough search of the bladder revealed:    normal urethral meatus    normal urothelium mildly cloudy urine    no stones    no ulcers     no tumors    no urethral polyps    Very mild trabeculation  - Ureteral orifices were normal in position and appearance.  Post-Procedure: - Patient tolerated the procedure well  Assessment/ Plan:  1. Chronic bacteriuria Chronic, asymptomatic, only treat for lower urinary tract symptoms the deviate from her baseline  No evidence of underlying pathology contributing to the above  Advised to continue topical estrogen cream, cranberry tablets and probiotics along with d-mannose in order to keep bacterial load and balance.  Also discussed okay to use Metamucil and MiraLAX as needed to address constipation. - Urinalysis, Complete -  sulfamethoxazole-trimethoprim (BACTRIM DS) 800-160 MG per tablet 1 tablet  2. Mixed incontinence (Primary) Stable, minimal bother - Urinalysis, Complete - sulfamethoxazole-trimethoprim (BACTRIM DS) 800-160 MG per tablet 1 tablet  3. Kidney stones Asymptomatic, likely unrelated to the above.  Would only recommend treatment if she becomes symptomatic.    Return if symptoms worsen or fail to improve.  Dustin Gimenez, MD

## 2023-10-22 ENCOUNTER — Encounter: Payer: Self-pay | Admitting: Family Medicine

## 2023-10-24 ENCOUNTER — Other Ambulatory Visit: Payer: Self-pay | Admitting: Nurse Practitioner

## 2023-10-24 DIAGNOSIS — N6453 Retraction of nipple: Secondary | ICD-10-CM

## 2023-11-10 ENCOUNTER — Other Ambulatory Visit

## 2023-11-10 ENCOUNTER — Ambulatory Visit
Admission: RE | Admit: 2023-11-10 | Discharge: 2023-11-10 | Disposition: A | Discharge status: Home / Self Care | Source: Ambulatory Visit | Attending: Nurse Practitioner | Admitting: Nurse Practitioner

## 2023-11-10 ENCOUNTER — Ambulatory Visit
Admission: RE | Admit: 2023-11-10 | Discharge: 2023-11-10 | Disposition: A | Discharge status: Home / Self Care | Source: Ambulatory Visit | Attending: Vascular Surgery | Admitting: Vascular Surgery

## 2023-11-10 ENCOUNTER — Encounter

## 2023-11-10 DIAGNOSIS — N6453 Retraction of nipple: Secondary | ICD-10-CM | POA: Diagnosis present

## 2023-11-10 DIAGNOSIS — I7121 Aneurysm of the ascending aorta, without rupture: Secondary | ICD-10-CM | POA: Diagnosis present

## 2023-11-10 DIAGNOSIS — I728 Aneurysm of other specified arteries: Secondary | ICD-10-CM | POA: Diagnosis present

## 2023-11-10 MED ORDER — IOHEXOL 350 MG/ML SOLN
75.0000 mL | Freq: Once | INTRAVENOUS | Status: AC | PRN
  Administered 2023-11-10: 75 mL via INTRAVENOUS

## 2023-11-21 ENCOUNTER — Ambulatory Visit (INDEPENDENT_AMBULATORY_CARE_PROVIDER_SITE_OTHER): Admitting: Vascular Surgery

## 2023-12-09 ENCOUNTER — Ambulatory Visit (INDEPENDENT_AMBULATORY_CARE_PROVIDER_SITE_OTHER): Admitting: Vascular Surgery

## 2023-12-09 ENCOUNTER — Encounter (INDEPENDENT_AMBULATORY_CARE_PROVIDER_SITE_OTHER): Payer: Self-pay | Admitting: Vascular Surgery

## 2023-12-09 VITALS — BP 153/80 | HR 68 | Resp 18 | Ht 62.0 in | Wt 159.6 lb

## 2023-12-09 DIAGNOSIS — I7121 Aneurysm of the ascending aorta, without rupture: Secondary | ICD-10-CM | POA: Diagnosis not present

## 2023-12-09 DIAGNOSIS — I10 Essential (primary) hypertension: Secondary | ICD-10-CM | POA: Diagnosis not present

## 2023-12-09 DIAGNOSIS — E785 Hyperlipidemia, unspecified: Secondary | ICD-10-CM

## 2023-12-09 DIAGNOSIS — I728 Aneurysm of other specified arteries: Secondary | ICD-10-CM | POA: Diagnosis not present

## 2023-12-09 NOTE — Assessment & Plan Note (Signed)
 I have independently reviewed her CT angiogram of the chest.  She has a stable 4.2 cm ascending thoracic aortic aneurysm which has not changed from previous studies.  The official report is in agreement with this.  The official report does not mention the enlargement and aneurysmal degeneration of the innominate artery, but this appears to be stable at about 1.7 cm in maximal diameter to me.  No role for intervention at these levels.  We will continue serial annual follow-up with CT scans.  Blood pressure control and avoidance of tobacco are the most important roles to avoid aneurysmal degeneration.

## 2023-12-09 NOTE — Progress Notes (Signed)
 MRN : [id number]  Carolyn Woods is a 83 y.o. ([birth year]) female who presents with chief complaint of  Chief Complaint  Patient presents with   Results    CT results  .  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  The patient is doing well.  She has had no major issues or problems since her last visit.  She remains on aspirin and a statin agent.  No focal neurologic symptoms of cerebrovascular ischemia. Specifically, the patient denies amaurosis fugax, speech or swallowing difficulties, or arm or leg weakness or numbness.  No chest pain, back pain, or signs of peripheral embolization. I have independently reviewed her CT angiogram of the chest.  She has a stable 4.2 cm ascending thoracic aortic aneurysm which has not changed from previous studies.  The official report is in agreement with this.  The official report does not mention the enlargement and aneurysmal degeneration of the innominate artery, but this appears to be stable at about 1.7 cm in maximal diameter to me.   Current Outpatient Medications  Medication Sig Dispense Refill   amLODipine (NORVASC) 2.5 MG tablet Take 2.5 mg by mouth.     aspirin EC 81 MG tablet Take 81 mg by mouth daily.     atenolol (TENORMIN) 25 MG tablet Take by mouth.     conjugated estrogens (PREMARIN) vaginal cream Place vaginally.     donepezil (ARICEPT) 5 MG tablet Take 5 mg by mouth at bedtime.     estradiol (ESTRACE) 1 MG tablet Take 1 mg by mouth 2 (two) times a week.     gabapentin  (NEURONTIN ) 100 MG capsule Take 100 mg by mouth at bedtime.     NONFORMULARY OR COMPOUNDED ITEM See pharmacy note 120 each 2   pantoprazole (PROTONIX) 40 MG tablet TAKE 1 TABLET BY MOUTH TWICE DAILY     simvastatin (ZOCOR) 20 MG tablet Take 20 mg by mouth.     Vitamin D, Ergocalciferol, (DRISDOL) 50000 units CAPS capsule TK ONE C PO ONCE A WEEK     No current facility-administered  medications for this visit.    Past Medical History:  Diagnosis Date   Anemia    Arthritis    Ascending aortic aneurysm    Candidiasis of the genitals, female    Cervical cancer [facility])    Hysterectomy.   Cystitis    Diverticulosis    Duodenal adenoma    Gastritis    Hyperlipidemia    Hypertension    Incontinence overflow, stress female    Sciatica    Tubular adenoma     Past Surgical History:  Procedure Laterality Date   ABDOMINAL HYSTERECTOMY     COLONOSCOPY  02/20/10   ERCP     ESOPHAGOGASTRODUODENOSCOPY (EGD) WITH PROPOFOL  N/A 10/25/2014   Procedure: ESOPHAGOGASTRODUODENOSCOPY (EGD) WITH PROPOFOL ;  Surgeon: Gladis RAYMOND Mariner, MD;  Location: [facility] ENDOSCOPY;  Service: Endoscopy;  Laterality: N/A;   ESOPHAGOGASTRODUODENOSCOPY (EGD) WITH PROPOFOL  N/A 12/05/2015   Procedure: ESOPHAGOGASTRODUODENOSCOPY (EGD) WITH PROPOFOL ;  Surgeon: Gladis RAYMOND Mariner, MD;  Location: [facility] ENDOSCOPY;  Service: Endoscopy;  Laterality: N/A;   ESOPHAGOGASTRODUODENOSCOPY (EGD) WITH PROPOFOL  N/A 02/06/2021   Procedure: ESOPHAGOGASTRODUODENOSCOPY (EGD) WITH PROPOFOL ;  Surgeon: Maryruth Ole DASEN, MD;  Location: [facility] ENDOSCOPY;  Service: Endoscopy;  Laterality: N/A;     Social History   Tobacco Use   Smoking status: Never   Smokeless tobacco: Never  Vaping Use   Vaping  status: Never Used  Substance Use Topics   Alcohol use: No    Alcohol/week: 1.0 standard drink of alcohol    Types: 1 Glasses of wine per week   Drug use: No      Family History  Problem Relation Age of Onset   Breast cancer Sister      Allergies  Allergen Reactions   Linzess [Linaclotide]    Lubiprostone Nausea Only   Codeine Rash     REVIEW OF SYSTEMS (Negative unless checked)   Constitutional: [] Weight loss  [] Fever  [] Chills Cardiac: [] Chest pain   [] Chest pressure   [] Palpitations   [] Shortness of breath when laying flat   [] Shortness of breath at rest   [] Shortness of breath with exertion. Vascular:  [] Pain  in legs with walking   [] Pain in legs at rest   [] Pain in legs when laying flat   [] Claudication   [] Pain in feet when walking  [] Pain in feet at rest  [] Pain in feet when laying flat   [] History of DVT   [] Phlebitis   [] Swelling in legs   [] Varicose veins   [] Non-healing ulcers Pulmonary:   [] Uses home oxygen   [] Productive cough   [] Hemoptysis   [] Wheeze  [] COPD   [] Asthma Neurologic:  [] Dizziness  [] Blackouts   [] Seizures   [] History of stroke   [] History of TIA  [] Aphasia   [] Temporary blindness   [] Dysphagia   [] Weakness or numbness in arms   [] Weakness or numbness in legs Musculoskeletal:  [x] Arthritis   [] Joint swelling   [x] Joint pain   [] Low back pain Hematologic:  [] Easy bruising  [] Easy bleeding   [] Hypercoagulable state   [x] Anemic   Gastrointestinal:  [] Blood in stool   [] Vomiting blood  [x] Gastroesophageal reflux/heartburn   [] Abdominal pain Genitourinary:  [] Chronic kidney disease   [] Difficult urination  [] Frequent urination  [] Burning with urination   [] Hematuria Skin:  [] Rashes   [] Ulcers   [] Wounds Psychological:  [] History of anxiety   []  History of major depression.   Physical Examination  BP (!) 153/80 (BP Location: Right Arm, Patient Position: Sitting, Cuff Size: Normal)   Pulse 68   Resp 18   Ht 5' 2 (1.575 m)   Wt 159 lb 9.6 oz (72.4 kg)   BMI 29.19 kg/m  Gen:  WD/WN, NAD Head: [place]/AT, No temporalis wasting. Ear/Nose/Throat: Hearing grossly intact, nares w/o erythema or drainage Eyes: Conjunctiva clear. Sclera non-icteric Neck: Supple.  Trachea midline Pulmonary:  Good air movement, no use of accessory muscles.  Cardiac: RRR, no JVD Vascular:  Vessel Right Left  Radial Palpable Palpable           Musculoskeletal: M/S 5/5 throughout.  No deformity or atrophy. No edema. Neurologic: Sensation grossly intact in extremities.  Symmetrical.  Speech is fluent.  Psychiatric: Judgment intact, Mood & affect appropriate for pt's clinical situation. Dermatologic: No  rashes or ulcers noted.  No cellulitis or open wounds.      Labs No results found for this or any previous visit (from the past 2160 hours).  Radiology CT ANGIO CHEST AORTA W/CM & OR WO/CM Result Date: 11/12/2023 CLINICAL DATA:  Follow-up aortic aneurysm. EXAM: CT ANGIOGRAPHY CHEST WITH CONTRAST TECHNIQUE: Multidetector CT imaging of the chest was performed using the standard protocol during bolus administration of intravenous contrast. Multiplanar CT image reconstructions and MIPs were obtained to evaluate the vascular anatomy. RADIATION DOSE REDUCTION: This exam was performed according to the departmental dose-optimization program which includes automated exposure control, adjustment of  the mA and/or kV according to patient size and/or use of iterative reconstruction technique. CONTRAST:  75mL OMNIPAQUE  IOHEXOL  350 MG/ML SOLN COMPARISON:  Chest CT dated 11/06/2022. FINDINGS: Cardiovascular: Borderline cardiomegaly. No pericardial effusion. Similar appearance of the mildly dilated ascending aorta measuring up to 4.2 cm. No aortic dissection. There is mild atherosclerotic calcification of the thoracic aorta. The origins of the great vessels of the aortic arch and the central pulmonary arteries are patent. Mediastinum/Nodes: No hilar or mediastinal adenopathy. The esophagus is grossly unremarkable. No mediastinal fluid collection. Lungs/Pleura: No focal consolidation, pleural effusion or pneumothorax. The central airways are patent. Upper Abdomen: There is enlargement of the head of the pancreas with mild atrophy of the body of the pancreas. This finding however is similar to CT dating back to 09/28/2014. Musculoskeletal: Osteopenia with degenerative changes spine. No acute osseous pathology. Review of the MIP images confirms the above findings. IMPRESSION: Stable 4.2 cm dilatation of the ascending aorta. Recommend annual imaging followup by CTA or MRA. This recommendation follows 2010  ACCF/AHA/AATS/ACR/ASA/SCA/SCAI/SIR/STS/SVM Guidelines for the Diagnosis and Management of Patients with Thoracic Aortic Disease. Circulation. 2010; 121: [id number]. Aortic aneurysm NOS (ICD10-I71.9) Electronically Signed   By: Vanetta Chou M.D.   On: 11/12/2023 19:02   MM 3D DIAGNOSTIC MAMMOGRAM BILATERAL BREAST Result Date: 11/10/2023 CLINICAL DATA:  Patient presents reporting an increase in her left size. EXAM: DIGITAL DIAGNOSTIC BILATERAL MAMMOGRAM WITH TOMOSYNTHESIS AND CAD TECHNIQUE: Bilateral digital diagnostic mammography and breast tomosynthesis was performed. The images were evaluated with computer-aided detection. COMPARISON:  Previous exam(s). ACR Breast Density Category a: The breasts are almost entirely fatty. FINDINGS: There are no suspicious masses, areas of significant asymmetry, areas of architectural distortion or suspicious calcifications. No mammographic change. IMPRESSION: 1. No evidence of breast malignancy. RECOMMENDATION: Screening mammogram in one year.(Code:[id number]) I have discussed the findings and recommendations with the patient. If applicable, a reminder letter will be sent to the patient regarding the next appointment. BI-RADS CATEGORY  1: Negative. Electronically Signed   By: Alm Parkins M.D.   On: 11/10/2023 09:55    Assessment/Plan  Ascending aortic aneurysm I have independently reviewed her CT angiogram of the chest.  She has a stable 4.2 cm ascending thoracic aortic aneurysm which has not changed from previous studies.  The official report is in agreement with this.  The official report does not mention the enlargement and aneurysmal degeneration of the innominate artery, but this appears to be stable at about 1.7 cm in maximal diameter to me.  No role for intervention at these levels.  We will continue serial annual follow-up with CT scans.  Blood pressure control and avoidance of tobacco are the most important roles to avoid aneurysmal degeneration.  Subclavian  aneurysm I have independently reviewed her CT angiogram of the chest.  She has a stable 4.2 cm ascending thoracic aortic aneurysm which has not changed from previous studies.  The official report is in agreement with this.  The official report does not mention the enlargement and aneurysmal degeneration of the innominate artery, but this appears to be stable at about 1.7 cm in maximal diameter to me.  No role for intervention.  Recheck in 1 year with CT scan.  Hypertension blood pressure control important in reducing the progression of atherosclerotic disease and aneurysmal degeneration. On appropriate oral medications.     Hyperlipidemia lipid control important in reducing the progression of atherosclerotic disease. Continue statin therapy  Selinda Gu, MD  12/09/2023 10:53 AM    This note  was created with Dragon medical transcription system.  Any errors from dictation are purely unintentional

## 2023-12-09 NOTE — Assessment & Plan Note (Signed)
 I have independently reviewed her CT angiogram of the chest.  She has a stable 4.2 cm ascending thoracic aortic aneurysm which has not changed from previous studies.  The official report is in agreement with this.  The official report does not mention the enlargement and aneurysmal degeneration of the innominate artery, but this appears to be stable at about 1.7 cm in maximal diameter to me.  No role for intervention.  Recheck in 1 year with CT scan.
